# Patient Record
Sex: Female | Born: 1960 | Race: White | Hispanic: No | Marital: Single | Smoking: Current every day smoker
Health system: Southern US, Community
[De-identification: ages and names within clinical notes are randomized; demographics above are authoritative.]

## PROBLEM LIST (undated history)

## (undated) DIAGNOSIS — I639 Cerebral infarction, unspecified: Secondary | ICD-10-CM

## (undated) DIAGNOSIS — J449 Chronic obstructive pulmonary disease, unspecified: Secondary | ICD-10-CM

## (undated) DIAGNOSIS — I739 Peripheral vascular disease, unspecified: Secondary | ICD-10-CM

## (undated) DIAGNOSIS — E119 Type 2 diabetes mellitus without complications: Secondary | ICD-10-CM

## (undated) DIAGNOSIS — G8929 Other chronic pain: Secondary | ICD-10-CM

## (undated) DIAGNOSIS — I509 Heart failure, unspecified: Secondary | ICD-10-CM

## (undated) DIAGNOSIS — J45909 Unspecified asthma, uncomplicated: Secondary | ICD-10-CM

## (undated) HISTORY — PX: OTHER SURGICAL HISTORY: SHX169

---

## 2004-09-25 ENCOUNTER — Emergency Department (HOSPITAL_COMMUNITY): Admission: EM | Admit: 2004-09-25 | Discharge: 2004-09-25 | Payer: Self-pay | Admitting: Emergency Medicine

## 2013-01-14 ENCOUNTER — Inpatient Hospital Stay: Payer: Self-pay | Admitting: Internal Medicine

## 2013-01-14 LAB — CBC WITH DIFFERENTIAL/PLATELET
Basophil #: 0.1 x10 3/mm 3
Basophil %: 0.7 %
Eosinophil #: 0.3 x10 3/mm 3
Eosinophil %: 1.9 %
HCT: 33.8 % — ABNORMAL LOW
HGB: 11.1 g/dL — ABNORMAL LOW
Lymphocyte %: 20.4 %
Lymphs Abs: 2.8 x10 3/mm 3
MCH: 25.3 pg — ABNORMAL LOW
MCHC: 32.8 g/dL
MCV: 77 fL — ABNORMAL LOW
Monocyte #: 0.5 "x10 3/mm "
Monocyte %: 3.7 %
Neutrophil #: 10 x10 3/mm 3 — ABNORMAL HIGH
Neutrophil %: 73.3 %
Platelet: 474 x10 3/mm 3 — ABNORMAL HIGH
RBC: 4.38 X10 6/mm 3
RDW: 18.8 % — ABNORMAL HIGH
WBC: 13.6 x10 3/mm 3 — ABNORMAL HIGH

## 2013-01-14 LAB — COMPREHENSIVE METABOLIC PANEL WITH GFR
Albumin: 2.4 g/dL — ABNORMAL LOW
Alkaline Phosphatase: 162 U/L — ABNORMAL HIGH
Anion Gap: 5 — ABNORMAL LOW
BUN: 15 mg/dL
Bilirubin,Total: 0.2 mg/dL
Calcium, Total: 8.9 mg/dL
Chloride: 105 mmol/L
Co2: 27 mmol/L
Creatinine: 1.13 mg/dL
EGFR (African American): >60
EGFR (Non-African Amer.): 56 — ABNORMAL LOW
Glucose: 208 mg/dL — ABNORMAL HIGH
Osmolality: 281
Potassium: 3.9 mmol/L
SGOT(AST): 27 U/L
SGPT (ALT): 24 U/L
Sodium: 137 mmol/L
Total Protein: 8.3 g/dL — ABNORMAL HIGH

## 2013-01-14 LAB — CK-MB
CK-MB: 9.6 ng/mL — ABNORMAL HIGH
CK-MB: 12 ng/mL — ABNORMAL HIGH (ref 0.5–3.6)

## 2013-01-14 LAB — URINALYSIS, COMPLETE
Glucose,UR: NEGATIVE mg/dL (ref 0–75)
Leukocyte Esterase: NEGATIVE
Nitrite: NEGATIVE
Ph: 7 (ref 4.5–8.0)
Protein: 30
Specific Gravity: 1.008 (ref 1.003–1.030)
Squamous Epithelial: NONE SEEN
WBC UR: 1 /HPF (ref 0–5)

## 2013-01-14 LAB — APTT: Activated PTT: 54.4 s — ABNORMAL HIGH

## 2013-01-14 LAB — PROTIME-INR
INR: 2.6
Prothrombin Time: 27 s — ABNORMAL HIGH

## 2013-01-14 LAB — MAGNESIUM: Magnesium: 1.8 mg/dL

## 2013-01-14 LAB — TROPONIN I
Troponin-I: 0.46 ng/mL — ABNORMAL HIGH
Troponin-I: 1.3 ng/mL — ABNORMAL HIGH

## 2013-01-14 LAB — PRO B NATRIURETIC PEPTIDE: B-Type Natriuretic Peptide: 3056 pg/mL — ABNORMAL HIGH (ref 0–125)

## 2013-01-15 LAB — PROTIME-INR
INR: 2.9
Prothrombin Time: 29.2 secs — ABNORMAL HIGH (ref 11.5–14.7)

## 2013-01-15 LAB — HEMOGLOBIN A1C: Hemoglobin A1C: 9.5 % — ABNORMAL HIGH (ref 4.2–6.3)

## 2013-01-15 LAB — VANCOMYCIN, TROUGH: Vancomycin, Trough: 11 ug/mL (ref 10–20)

## 2013-01-15 LAB — CBC WITH DIFFERENTIAL/PLATELET
Basophil #: 0 10*3/uL (ref 0.0–0.1)
Eosinophil #: 0 10*3/uL (ref 0.0–0.7)
Eosinophil %: 0 %
HGB: 10.1 g/dL — ABNORMAL LOW (ref 12.0–16.0)
Lymphocyte #: 1.1 10*3/uL (ref 1.0–3.6)
Lymphocyte %: 9 %
MCV: 77 fL — ABNORMAL LOW (ref 80–100)
Monocyte #: 0.3 x10 3/mm (ref 0.2–0.9)
Neutrophil #: 10.6 10*3/uL — ABNORMAL HIGH (ref 1.4–6.5)
Platelet: 437 10*3/uL (ref 150–440)
RBC: 4.01 10*6/uL (ref 3.80–5.20)
RDW: 19 % — ABNORMAL HIGH (ref 11.5–14.5)

## 2013-01-15 LAB — BASIC METABOLIC PANEL
Anion Gap: 6 — ABNORMAL LOW (ref 7–16)
BUN: 24 mg/dL — ABNORMAL HIGH (ref 7–18)
Chloride: 101 mmol/L (ref 98–107)
Co2: 30 mmol/L (ref 21–32)
Creatinine: 1.15 mg/dL (ref 0.60–1.30)
EGFR (African American): >60
EGFR (Non-African Amer.): 55 — ABNORMAL LOW
Glucose: 392 mg/dL — ABNORMAL HIGH (ref 65–99)
Potassium: 4.2 mmol/L (ref 3.5–5.1)

## 2013-01-15 LAB — TSH: Thyroid Stimulating Horm: 0.705 u[IU]/mL

## 2013-01-16 LAB — PROTIME-INR
INR: 3.8
Prothrombin Time: 35.8 secs — ABNORMAL HIGH (ref 11.5–14.7)

## 2014-05-11 NOTE — H&P (Signed)
PATIENT NAME:  Alexandra Goodman, Alexandra Goodman MR#:  [id number] DATE OF BIRTH:  1960/05/27  DATE OF ADMISSION:  01/14/2013  PRIMARY CARE PHYSICIAN: Nonlocal, in [facility]  REFERRING PHYSICIAN: Aneta Mins A. Scotty Court, MD  CHIEF COMPLAINT: Shortness of breath, cough, wheezing for 2 days.  HISTORY OF PRESENT ILLNESS: A 54 year old Caucasian female with a history of CHF, COPD, diabetes, hypertension, neuropathy, left foot infection, who presented to the ED with shortness of breath, cough and wheezing for 2 days. The patient denies any fevers or chills. No dysuria or hematuria. The patient's symptoms have been worsening, even when the patient used nebulizer, without improvement. In addition, the patient has leg edema. Also, the patient has orthopnea and nocturnal dyspnea. The patient has left foot infection, has been on antibiotics including vancomycin and clindamycin for 2 weeks. The patient is status post left foot amputation in July. The patient developed a rash after antibiotic treatment. She is taking some Benadryl for rash. The patient was sent to ED due to worsening shortness of breath, O2 saturation decreased to 64%. Is being placed on BiPAP treatment.   PAST MEDICAL HISTORY:  1. Hypertension.  2. Diabetes. 3. CHF. 4. COPD. 5. Neuropathy. 6. Left foot infection status post amputation.   PAST SURGICAL HISTORY:  1. Left foot amputation.  2. Hysterectomy.   FAMILY HISTORY: Hypertension, diabetes, CHF.   SOCIAL HISTORY: No smoking, alcohol drinking or illicit drugs.   ALLERGIES: ACE INHIBITOR, FLAGYL AND SERTRALINE.   HOME MEDICATIONS:  1. Amitriptyline 150 mg p.o. daily.  2. Aspirin 81 mg p.o. daily.  3. Atenolol 25 mg p.o. daily.  4. Atorvastatin 80 mg p.o. daily. 5. Buspirone 10 mg p.o. daily.  6. Calcium alginate 4 x 4 bandage, cover with gauze foam.  7. Clindamycin 300 mg p.o. 3 times a day.  8. Cozaar 25 mg p.o. daily.  9. Diltiazem 180 mg p.o. daily.  10. Lovenox 80 mg injection  every 12 hours.  11. Ertapenem 1 gram IV PB daily.  12. Flonase 50 mcg nasal spray. 13. Potassium 10 mEq.  14. Pramipexole 0.125 mg tablet 1 tablet p.o. twice a day.  15. ProAir HFA 90 mcg.  16. Albuterol 2.5 mg/3 mL nebulizer solution 3 mL every 4 hours p.r.n. for wheezing.  17. Tiagabine 4 mg p.o. t.i.d.  18. Vancomycin 1250 mg IV PB every 12 hours.  19. Coumadin 5 mg p.o. 1.5 tablets daily.  20. Zantac 150 mg p.o.  21. Insulin NPH 70/30 at 58 units twice a day.  22. Actiform bandage apply topically daily.  23. Levothyroxine 50 mcg p.o. daily.  24. Metformin 1000 mg p.o. daily.  25. Nitroglycerin 0.4 mg p.o. tablets 1 tablet every 5 minutes for chest pain.  26. Oxybutynin 5 mg p.o. twice a day.  27. Oxycodone 5 mg p.o. every 4 hours p.r.n.  28. Oxycodone/acetaminophen 10/325 mg p.o. tablets every 6 hours p.r.n.  29. Plavix 75 mg p.o. daily.   REVIEW OF SYSTEMS:  CONSTITUTIONAL: The patient denies any fever or chills. No headache or dizziness, but has weakness.  EYES: No double vision or blurry vision.  EAR, NOSE, THROAT: No postnasal drip, slurred speech or dysphagia.  CARDIOVASCULAR: No chest pain, palpitations, but has orthopnea, nocturnal dyspnea and leg edema.  PULMONARY: Cough, sputum, shortness of breath, but no hemoptysis.  GASTROINTESTINAL: No abdominal pain, nausea, vomiting or diarrhea. No melena or bloody stool.  GENITOURINARY: No dysuria, hematuria or incontinence.  SKIN: Has a rash, but no jaundice.  ENDOCRINOLOGY: No  polyuria, polydipsia, heat or cold intolerance.   HEMATOLOGY: No easy bruising or bleeding.  ENDOCRINOLOGY: No polyuria, polydipsia, heat or cold intolerance.  NEUROLOGY: No syncope, loss of consciousness or seizure.   PHYSICAL EXAMINATION:  VITAL SIGNS: Temperature 97.2, blood pressure 151/82, pulse 104, O2 saturation 99% on BiPAP.  GENERAL: The patient is alert, awake, oriented, obese, in no acute distress on BiPAP.  HEENT: Pupils round and equally  reactive to light and accommodation. Dry oral mucosa. Clear oropharynx.  NECK: Supple. No JVD or carotid bruit. No lymphadenopathy. No thyromegaly.  CARDIOVASCULAR: S1, S2, regular rate and rhythm, tachycardia.  PULMONARY: Bilateral air entry, bilateral basilar rales. No use of accessory muscles to breathe.  ABDOMEN: Soft. No distention or tenderness. No organomegaly. Bowel sounds present.  EXTREMITIES: Bilateral leg edema at 1+. No clubbing or cyanosis. No calf tenderness. Left foot in dressing.  NEUROLOGY: A and O x3. No focal deficit. Power 5/5. Sensation intact.  SKIN: There is a rash on the extremities, especially bilateral lower extremities. No jaundice.  LABORATORY DATA: Showed:  Urinalysis is negative.  Chest x-ray: Findings are consistent with COPD or reactive airway disease, mildly increased interstitial markings.  ABG showed pH of 7.29, pCO2 of 57, pO2 of 67, with FiO2 35%.  Lactic acid 2.0.  Troponin 0.46.  BNP is 3056.  WBC 13.6, hemoglobin 11.1 and platelets of 474.  Glucose 208, BUN 15, creatinine 1.013. Electrolytes are normal.  CK 82, CK-MB 4.3.  EKG shows sinus tachycardia at 130 BPM.   IMPRESSIONS:  1. Acute respiratory failure, possibly due to a combination of chronic obstructive pulmonary disease exacerbation and acute on chronic congestive heart failure.  2. Elevated troponin, possibly due to demand ischemia.  3. Left foot infection with leukocytosis.  4. Rash, possibly due to antibiotic allergy. 5. Hypertension.  6. Diabetes.  7. Neuropathy.   PLAN OF TREATMENT:  1. The patient will be admitted to stepdown unit. Will continue BiPAP. Start Solu-Medrol, Xopenex and Levaquin.  2. For CHF, will start Lasix 40 mg IV b.i.d. Start CHF protocol. Get an echocardiograph, cardiology consult.  3. For elevated troponin, which is possibly due to demand ischemia, we will follow up troponin level. Continue aspirin, statin, Plavix, Coumadin. Check INR level and continue  Lovenox, get cardiology consult..  4. For left foot infection, will get wound care consult, [facility], but hold vancomycin, clindamycin and ertapenem at this time, but continue Levaquin.  5. Also, will give Benadryl p.r.n.  6. For diabetes, we will start sliding scale. Check hemoglobin A1c and continue the patient's home medication, insulin NPH 70/30.   I discussed the patient's condition and plan of treatment with the patient and the patient's health caregiver.   CODE STATUS: The patient wants full code  CRITICAL TIME: 75 minutes.   ____________________________ Shaune PollackQing Kable Haywood, MD qc:lb D: 01/14/2013 14:38:47 ET T: 01/14/2013 15:01:31 ET JOB#: [id number]  cc: Shaune PollackQing Flor Whitacre, MD, <Dictator> Shaune PollackQING Keatyn Jawad MD ELECTRONICALLY SIGNED 01/15/2013 12:38

## 2014-05-12 NOTE — Consult Note (Signed)
PATIENT NAME:  Alexandra Goodman, Alexandra Goodman MR#:  [id number] DATE OF BIRTH:  [birth year]  DATE OF CONSULTATION:  01/14/2013  CONSULTING PHYSICIAN:  Dwayne D. Juliann Pares, MD  REFERRING PHYSICIAN: Dr. Imogene Burn.   The patient normally goes to [facility].   INDICATION: Shortness of breath, possible heart failure.   HISTORY OF PRESENT ILLNESS: The patient is a 54 year old white female with history of CHF, COPD, diabetes, hypertension, neuropathy, left foot infection came to the Emergency Room with shortness of breath, cough, wheezing, for about 2 days. The patient denies any chills. No hematuria. Symptoms being worse over the last 2 to 3 days, even when the patient used her nebulizer at home for her COPD. She has had some leg edema, some orthopnea, nocturia, PND, left foot infection as well on the antibiotics without significant improvement. She had been taking Benadryl for a rash. There was some concern that the antibiotic caused a rash, came to the Emergency Room for evaluation, was placed on BiPAP. O2 sats in the 60s.   PAST MEDICAL HISTORY: Hypertension, diabetes, CHF, COPD, neuropathy, left foot infection.   PAST SURGICAL HISTORY: Left foot amputation, hysterectomy.   FAMILY HISTORY: Hypertension, diabetes, CHF.   SOCIAL HISTORY: No smoking or drinking or illicit drug use.   ALLERGIES: FLAGYL, SERTRALINE.   MEDICATIONS: Amitriptyline, aspirin 81 mg a day, atenolol 25 a day, atorvastatin 80 a day, buspirone 10 mg a day, calcium to  cover her bandage with a 4 x 4. Clindamycin 300 two times a day, Cozaar 25 a day, diltiazem 180 a day Lovenox 80 mg every 12 hours, ertapenem 1 gram IV a day, Flonase 50 mcg nasal spray once a day, potassium 10 mEq a day.  0.125, 1 tablet twice a day. ProAir, the patient's albuterol nebulizer p.r.n., Tiagabine 4 mg 3 times a day, vancomycin 1250 every 12 hours, Coumadin 5 mg 1-1/2 tablet daily, Zantac150  twice a day, insulin 70/30novolin units twice a day.  levothyroxine 50 mcg a day,  metformin 1000 mg daily, nitroglycerin p.r.n. Oxybutynin 5 mg twice a day, oxycodone 5 mg 4 times a day,  oxycodone/acetaminophen p.r.n., Plavix 75 a day.   REVIEW OF SYSTEMS: Denies blackout spells or syncope. No nausea or vomiting. No fever. No chills. No sweats. No weight loss or weight gain. No hemoptysis or hematemesis. No bright red blood per rectum. She has had shortness of breath, congestion, cough, wheezing and no rash.   PHYSICAL EXAMINATION: VITAL SIGNS: Blood pressure 150/80, pulse 100, respiratory rate 20 on BiPAP.  HEENT: Normocephalic, atraumatic. Pupils equal, reactive to light.  NECK: Supple. No significant JVD, bruits, or adenopathy.  LUNGS: Bilateral rhonchi, scattered wheezing no definite rales. Coarse breath sounds. Adequate air movement  ABDOMEN: Benign.  EXTREMITIES: Within normal limits.  NEUROLOGIC: Intact.  SKIN: Diffuse, erythematous rash.   LABORATORIES: UA was negative. Chest x-ray: COPD with increased interstitial markings. No definite pneumonia. ABG: 7.27, 57, 67,  sat of 35% FiO2. Lactic acid was 2. Troponin 0.46. BNP 3000. White count 13, hemoglobin 11, platelet count 474, glucose 208, BUN 15, creatinine 1.013, electrolytes normal. CK 80, MB 4.3. EKG: Sinus tach, initially at 130, now it is about 100 with nonspecific findings.   ASSESSMENT: Acute respiratory failure, chronic obstructive pulmonary disease, bronchitis, borderline troponins, probably demand ischemia, left foot infection, leukocytosis, rash, possibly antibiotic-related, hypertension, diabetes, neuropathy, chronic pain, hypoxemia, as well as diabetes.    PLAN:  1. Agree with admit. Rule out for myocardial infarction. Follow up cardiac enzymes. Mainly concentrate on  respiratory support, BiPAP, Solu-Medrol inhalers, IV antibiotics, pulmonary input.  2. Possible congestive heart failure. Continue IV Lasix. Echocardiogram will be helpful. Do not recommend any cardiac intervention at this point.  3.  Elevated troponins. Follow up troponins. Follow up EKGs. Suspect this is demand ischemia. Do not recommend a direct cardiac event at this point. Continue anticoagulation with Lovenox and heparin, and continue Plavix. 4.  . Continue antibiotics. Consider podiatry or vascular surgery consult. ID consult would be helpful.  5. Continue  for possible allergic reaction, and steroids will be helpful, both for her lungs and possibly her rash.  6. Continue diabetes management. At this point, we will treat the patient conservatively from a cardiac standpoint. No direct intervention necessary at this point.     Dwayne D. Juliann Paresallwood, MD ddc:sg D: 01/15/2013 06:46:09 ET T: 01/15/2013 11:13:19 ET JOB#: [id number]  cc: Dwayne D. Juliann Paresallwood, MD, <Dictator> Alwyn PeaWAYNE D CALLWOOD MD ELECTRONICALLY SIGNED 02/16/2013 16:25

## 2014-05-12 NOTE — Discharge Summary (Signed)
PATIENT NAME:  Alexandra Goodman, Alexandra Goodman MR#:  [id number] DATE OF BIRTH:  1960/11/10  DATE OF ADMISSION:  01/14/2013 DATE OF DISCHARGE:  01/16/2013  DISCHARGE DIAGNOSES: 1. Acute respiratory failure secondary to chronic obstructive pulmonary disease exacerbation.  2. Acute-on-chronic diastolic heart failure.  3. Elevated troponins secondary to demand ischemia from chronic obstructive pulmonary disease and congestive heart failure.  4. Diabetes mellitus type 2.  5. 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  6. History of deep vein thrombosis with hypercoagulable state.  7. History of coronary artery disease with stents.   DISCHARGE MEDICATIONS: 1. Amitriptyline 150 mg 1 tablet p.o. at bedtime as needed.  2. Aspirin 81 mg p.o. daily.  3. Atenolol 25 mg p.o. daily.  4. Atorvastatin 80 mg p.o. daily.  5. BuSpar 10 mg p.o. b.i.d.  6. Colace 100 mg p.o. as needed for constipation.  7. Losartan 25 mg p.o. daily.  8. Cardizem CD 180 mg p.o. daily.  9. Fluticasone nasal spray 50 mcg 2 sprays in each nostril daily.  10. Lasix 40 mg 2 tablets, that  is 80 mg in the morning and 40 mg at bedtime.  11. NPH insulin 70/30 58 units 2 times a day. The patient knows how to adjust insulin  depending on sugar levels.  12. Levothyroxine 50 mcg p.o. daily.  13. Metformin 1 g p.o. b.i.d.  14. Nitroglycerin sublingual as needed for chest pain.  15. Oxybutynin 5 mg p.o. b.i.d. 16. Oxycodone 5 mg every 4 hours as needed for pain.  17. Percocet 10/325, 1 tablet every 6 hours as needed for pain.  18. Plavix 75 mg p.o. daily.  19. KCl 10 mEq p.o. daily.  20. Pramipexole 0.125 mg p.o. b.i.d.  21. ProAir 2 puffs 4 times a day as needed for wheezing.  22. Nebulizer, albuterol with Atrovent every 4 hours as needed for wheezing.  23. Tiagabine 4 mg 1 tablet p.o. t.i.d.  24. Vancomycin 1 g every 18 hours. The patient needs it until she sees the podiatrist on Wednesday.  25. Levaquin  750 mg every 24 hours for 10 days.  26. Prednisone dose tab 320 mg 3 tablets daily for 2 days, 2 tablet s daily for 2 days, 1 tablet daily for 2 days, and then stop.   DIET: Low-sodium, low-fat, ADA diet.   [facility], so we advised her to resume the home health regarding antibiotics and dressing changes for the left foot wound.   [facility]. The patient is following up with them regarding MRSA infection in the left foot and also left foot osteomyelitis. She is on vancomycin. She was also on ertapenem, but that was discontinued because of a rash.  She [facility] possible [facility] of the left foot.   HOSPITAL COURSE: 1. Respiratory failure. A 54 year old female patient brought in because of shortness of breath, cough, and wheezing. The patient's O2 saturations were 64% on room air. On arrival she was placed on BiPAP, admitted to ICU. The patient's temperature was 97.2. X-ray of the chest showed COPD with reactive airway disease and mild interstitial markings. ABG was 7.29 pH and CO2 was 57, pO2 of 67. The patient's troponins were elevated. She had a BNP of 3056. She had started on Levaquin, IV steroids, and also nebulizers. She was able to come off the BiPAP because of steroids, antibiotics, and nebulizers.  The patient's symptoms improved nicely.  She also received IV Lasix because of her CHF exacerbation.  The patient assessed for home oxygen need, but she maintained sats of above 90 on room air at rest and also exertion, so she did not qualify for home oxygen. Advised her to continue tapering course of prednisone, Levaquin, and nebulizers, and follow up with her doctor at [facility].  2. Acute-on-chronic diastolic heart failure. The patient is seen by Dr. Juliann Pares because her troponins were elevated during the hospital stay. She never had chest pain. The patient's initial troponin 0.46, the following one was  1.30, and then the third one 2.10.  The patient's EKG showed sinus tach with 136 beats per minute on admission. The patient's troponin elevation thought to be secondary to demand ischemia because of her CHF and COPD. The echocardiogram showed EF of 50% to 55% with mild anteroapical hypo.  The patient has mild systolic dysfunction.  Anyway, the patient felt better with Lasix, so we continued Lasix, and she was discharged with home dose Lasix. 3. History of hypercoagulable state. She has DVT history and also some coagulation problems. She is on Coumadin for a long time. We continued that, and the patient's INR was 3.8 on the 29th.  4. Diabetes mellitus type 2.  Sugars are elevated because of steroids. The patient told me that she knows how to adjust insulin depending on sugars, and she has a machine at home that she checks.  5. History of coronary artery disease and stent placement. She is on aspirin and Plavix and Coumadin. Continue that.   DISCHARGE VITAL SIGNS: Temperature 97.3, heart rate 80, blood pressure 130/42, sats of 92% on room air at rest and also 90% on exertion.   CONDITION AT TIME OF DISCHARGE: Stable.   TIME SPENT ON DISCHARGE PREPARATION: More than 30 minutes.   ADDENDUM:  The patient has history of DVT and history of left foot infection. She is following up with [facility] podiatry. She is on vancomycin and also getting home care, home health through [facility]. She is on vancomycin. Here we started vancomycin, the home dose, but pharmacy had to adjust the dose to 1 g every 18 hours. The patient discharged home with vancomycin and also Levaquin. Vancomycin  levels here, the vancomycin trough was 11 on the 28th.  The patient advised to follow up with [facility] on Wednesday, that is tomorrow, regarding possible foot amputation.   TIME SPENT ON DISCHARGE PREPARATION: More than 30 minutes.   ____________________________ Katha Hamming, MD sk:sg D: 01/17/2013 08:24:00 ET T: 01/17/2013 08:47:50  ET JOB#: [id number]  cc: Katha Hamming, MD, <Dictator> Katha Hamming MD ELECTRONICALLY SIGNED 01/22/2013 17:24

## 2016-05-05 ENCOUNTER — Other Ambulatory Visit
Admission: RE | Admit: 2016-05-05 | Discharge: 2016-05-05 | Disposition: A | Discharge status: Home / Self Care | Payer: Medicare Other | Source: Ambulatory Visit | Attending: Internal Medicine | Admitting: Internal Medicine

## 2016-05-05 DIAGNOSIS — I739 Peripheral vascular disease, unspecified: Secondary | ICD-10-CM | POA: Insufficient documentation

## 2016-05-05 LAB — CBC WITH DIFFERENTIAL/PLATELET
Basophils Absolute: 0 10*3/uL (ref 0–0.1)
Basophils Relative: 1 %
Eosinophils Absolute: 0.5 10*3/uL (ref 0–0.7)
Eosinophils Relative: 5 %
HCT: 37.8 % (ref 35.0–47.0)
HEMOGLOBIN: 12.5 g/dL (ref 12.0–16.0)
LYMPHS ABS: 2 10*3/uL (ref 1.0–3.6)
LYMPHS PCT: 23 %
MCH: 25.6 pg — AB (ref 26.0–34.0)
MCHC: 33 g/dL (ref 32.0–36.0)
MCV: 77.7 fL — AB (ref 80.0–100.0)
Monocytes Absolute: 0.6 10*3/uL (ref 0.2–0.9)
Monocytes Relative: 7 %
Neutro Abs: 5.6 10*3/uL (ref 1.4–6.5)
Neutrophils Relative %: 64 %
Platelets: 225 10*3/uL (ref 150–440)
RBC: 4.86 MIL/uL (ref 3.80–5.20)
RDW: 20.5 % — ABNORMAL HIGH (ref 11.5–14.5)
WBC: 8.7 10*3/uL (ref 3.6–11.0)

## 2016-05-05 LAB — CREATININE, SERUM
Creatinine, Ser: 1 mg/dL (ref 0.44–1.00)
GFR calc non Af Amer: >60 mL/min (ref >60)

## 2016-05-05 LAB — BUN: BUN: 18 mg/dL (ref 6–20)

## 2018-04-20 ENCOUNTER — Other Ambulatory Visit: Payer: Self-pay

## 2018-04-20 ENCOUNTER — Emergency Department: Payer: Medicare Other

## 2018-04-20 ENCOUNTER — Inpatient Hospital Stay
Admission: EM | Admit: 2018-04-20 | Discharge: 2018-04-27 | DRG: 871 | Disposition: A | Discharge status: Home Health | Payer: Medicare Other | Attending: Internal Medicine | Admitting: Internal Medicine

## 2018-04-20 DIAGNOSIS — I4891 Unspecified atrial fibrillation: Secondary | ICD-10-CM | POA: Diagnosis not present

## 2018-04-20 DIAGNOSIS — Z7902 Long term (current) use of antithrombotics/antiplatelets: Secondary | ICD-10-CM

## 2018-04-20 DIAGNOSIS — A419 Sepsis, unspecified organism: Secondary | ICD-10-CM | POA: Diagnosis present

## 2018-04-20 DIAGNOSIS — B9561 Methicillin susceptible Staphylococcus aureus infection as the cause of diseases classified elsewhere: Secondary | ICD-10-CM | POA: Diagnosis not present

## 2018-04-20 DIAGNOSIS — Z8673 Personal history of transient ischemic attack (TIA), and cerebral infarction without residual deficits: Secondary | ICD-10-CM

## 2018-04-20 DIAGNOSIS — F17210 Nicotine dependence, cigarettes, uncomplicated: Secondary | ICD-10-CM | POA: Diagnosis present

## 2018-04-20 DIAGNOSIS — Z6825 Body mass index (BMI) 25.0-25.9, adult: Secondary | ICD-10-CM

## 2018-04-20 DIAGNOSIS — J9621 Acute and chronic respiratory failure with hypoxia: Secondary | ICD-10-CM | POA: Diagnosis present

## 2018-04-20 DIAGNOSIS — N390 Urinary tract infection, site not specified: Secondary | ICD-10-CM | POA: Diagnosis present

## 2018-04-20 DIAGNOSIS — R7881 Bacteremia: Secondary | ICD-10-CM | POA: Diagnosis not present

## 2018-04-20 DIAGNOSIS — E874 Mixed disorder of acid-base balance: Secondary | ICD-10-CM | POA: Diagnosis present

## 2018-04-20 DIAGNOSIS — I248 Other forms of acute ischemic heart disease: Secondary | ICD-10-CM | POA: Diagnosis present

## 2018-04-20 DIAGNOSIS — F1110 Opioid abuse, uncomplicated: Secondary | ICD-10-CM | POA: Diagnosis present

## 2018-04-20 DIAGNOSIS — Z89512 Acquired absence of left leg below knee: Secondary | ICD-10-CM

## 2018-04-20 DIAGNOSIS — S32000A Wedge compression fracture of unspecified lumbar vertebra, initial encounter for closed fracture: Secondary | ICD-10-CM

## 2018-04-20 DIAGNOSIS — L97519 Non-pressure chronic ulcer of other part of right foot with unspecified severity: Secondary | ICD-10-CM | POA: Diagnosis present

## 2018-04-20 DIAGNOSIS — E039 Hypothyroidism, unspecified: Secondary | ICD-10-CM | POA: Diagnosis present

## 2018-04-20 DIAGNOSIS — J189 Pneumonia, unspecified organism: Secondary | ICD-10-CM | POA: Diagnosis present

## 2018-04-20 DIAGNOSIS — J9600 Acute respiratory failure, unspecified whether with hypoxia or hypercapnia: Secondary | ICD-10-CM

## 2018-04-20 DIAGNOSIS — J441 Chronic obstructive pulmonary disease with (acute) exacerbation: Secondary | ICD-10-CM | POA: Diagnosis present

## 2018-04-20 DIAGNOSIS — L8990 Pressure ulcer of unspecified site, unspecified stage: Secondary | ICD-10-CM

## 2018-04-20 DIAGNOSIS — Z20828 Contact with and (suspected) exposure to other viral communicable diseases: Secondary | ICD-10-CM | POA: Diagnosis present

## 2018-04-20 DIAGNOSIS — M4626 Osteomyelitis of vertebra, lumbar region: Secondary | ICD-10-CM | POA: Diagnosis present

## 2018-04-20 DIAGNOSIS — A4101 Sepsis due to Methicillin susceptible Staphylococcus aureus: Principal | ICD-10-CM | POA: Diagnosis present

## 2018-04-20 DIAGNOSIS — E1122 Type 2 diabetes mellitus with diabetic chronic kidney disease: Secondary | ICD-10-CM | POA: Diagnosis present

## 2018-04-20 DIAGNOSIS — M4646 Discitis, unspecified, lumbar region: Secondary | ICD-10-CM | POA: Diagnosis present

## 2018-04-20 DIAGNOSIS — G8929 Other chronic pain: Secondary | ICD-10-CM | POA: Diagnosis present

## 2018-04-20 DIAGNOSIS — E1142 Type 2 diabetes mellitus with diabetic polyneuropathy: Secondary | ICD-10-CM | POA: Diagnosis not present

## 2018-04-20 DIAGNOSIS — E7800 Pure hypercholesterolemia, unspecified: Secondary | ICD-10-CM | POA: Diagnosis present

## 2018-04-20 DIAGNOSIS — Z881 Allergy status to other antibiotic agents status: Secondary | ICD-10-CM | POA: Diagnosis not present

## 2018-04-20 DIAGNOSIS — R410 Disorientation, unspecified: Secondary | ICD-10-CM | POA: Diagnosis not present

## 2018-04-20 DIAGNOSIS — I2510 Atherosclerotic heart disease of native coronary artery without angina pectoris: Secondary | ICD-10-CM | POA: Diagnosis present

## 2018-04-20 DIAGNOSIS — E872 Acidosis, unspecified: Secondary | ICD-10-CM

## 2018-04-20 DIAGNOSIS — I130 Hypertensive heart and chronic kidney disease with heart failure and stage 1 through stage 4 chronic kidney disease, or unspecified chronic kidney disease: Secondary | ICD-10-CM | POA: Diagnosis present

## 2018-04-20 DIAGNOSIS — R443 Hallucinations, unspecified: Secondary | ICD-10-CM | POA: Diagnosis not present

## 2018-04-20 DIAGNOSIS — E1110 Type 2 diabetes mellitus with ketoacidosis without coma: Secondary | ICD-10-CM | POA: Diagnosis present

## 2018-04-20 DIAGNOSIS — E1151 Type 2 diabetes mellitus with diabetic peripheral angiopathy without gangrene: Secondary | ICD-10-CM | POA: Diagnosis present

## 2018-04-20 DIAGNOSIS — I509 Heart failure, unspecified: Secondary | ICD-10-CM | POA: Diagnosis present

## 2018-04-20 DIAGNOSIS — E440 Moderate protein-calorie malnutrition: Secondary | ICD-10-CM | POA: Diagnosis present

## 2018-04-20 DIAGNOSIS — E11621 Type 2 diabetes mellitus with foot ulcer: Secondary | ICD-10-CM | POA: Diagnosis present

## 2018-04-20 DIAGNOSIS — Z79890 Hormone replacement therapy (postmenopausal): Secondary | ICD-10-CM

## 2018-04-20 DIAGNOSIS — Z8249 Family history of ischemic heart disease and other diseases of the circulatory system: Secondary | ICD-10-CM

## 2018-04-20 DIAGNOSIS — Z87442 Personal history of urinary calculi: Secondary | ICD-10-CM

## 2018-04-20 DIAGNOSIS — Z91048 Other nonmedicinal substance allergy status: Secondary | ICD-10-CM

## 2018-04-20 DIAGNOSIS — E876 Hypokalemia: Secondary | ICD-10-CM | POA: Diagnosis present

## 2018-04-20 DIAGNOSIS — N182 Chronic kidney disease, stage 2 (mild): Secondary | ICD-10-CM | POA: Diagnosis present

## 2018-04-20 DIAGNOSIS — E871 Hypo-osmolality and hyponatremia: Secondary | ICD-10-CM | POA: Diagnosis present

## 2018-04-20 DIAGNOSIS — Z720 Tobacco use: Secondary | ICD-10-CM | POA: Diagnosis not present

## 2018-04-20 DIAGNOSIS — R739 Hyperglycemia, unspecified: Secondary | ICD-10-CM

## 2018-04-20 DIAGNOSIS — E785 Hyperlipidemia, unspecified: Secondary | ICD-10-CM | POA: Diagnosis present

## 2018-04-20 DIAGNOSIS — M4856XA Collapsed vertebra, not elsewhere classified, lumbar region, initial encounter for fracture: Secondary | ICD-10-CM | POA: Diagnosis present

## 2018-04-20 DIAGNOSIS — Z8631 Personal history of diabetic foot ulcer: Secondary | ICD-10-CM | POA: Diagnosis not present

## 2018-04-20 DIAGNOSIS — R911 Solitary pulmonary nodule: Secondary | ICD-10-CM | POA: Diagnosis present

## 2018-04-20 DIAGNOSIS — M86171 Other acute osteomyelitis, right ankle and foot: Secondary | ICD-10-CM

## 2018-04-20 DIAGNOSIS — N179 Acute kidney failure, unspecified: Secondary | ICD-10-CM | POA: Diagnosis present

## 2018-04-20 DIAGNOSIS — J181 Lobar pneumonia, unspecified organism: Secondary | ICD-10-CM

## 2018-04-20 DIAGNOSIS — G9341 Metabolic encephalopathy: Secondary | ICD-10-CM | POA: Diagnosis present

## 2018-04-20 DIAGNOSIS — J440 Chronic obstructive pulmonary disease with acute lower respiratory infection: Secondary | ICD-10-CM | POA: Diagnosis present

## 2018-04-20 DIAGNOSIS — Z7901 Long term (current) use of anticoagulants: Secondary | ICD-10-CM

## 2018-04-20 DIAGNOSIS — R0902 Hypoxemia: Secondary | ICD-10-CM | POA: Diagnosis not present

## 2018-04-20 DIAGNOSIS — Z888 Allergy status to other drugs, medicaments and biological substances status: Secondary | ICD-10-CM

## 2018-04-20 DIAGNOSIS — R590 Localized enlarged lymph nodes: Secondary | ICD-10-CM | POA: Diagnosis present

## 2018-04-20 DIAGNOSIS — I480 Paroxysmal atrial fibrillation: Secondary | ICD-10-CM | POA: Diagnosis present

## 2018-04-20 DIAGNOSIS — Z86718 Personal history of other venous thrombosis and embolism: Secondary | ICD-10-CM

## 2018-04-20 DIAGNOSIS — Z794 Long term (current) use of insulin: Secondary | ICD-10-CM

## 2018-04-20 HISTORY — DX: Other chronic pain: G89.29

## 2018-04-20 HISTORY — DX: Heart failure, unspecified: I50.9

## 2018-04-20 HISTORY — DX: Type 2 diabetes mellitus without complications: E11.9

## 2018-04-20 HISTORY — DX: Peripheral vascular disease, unspecified: I73.9

## 2018-04-20 HISTORY — DX: Cerebral infarction, unspecified: I63.9

## 2018-04-20 HISTORY — DX: Chronic obstructive pulmonary disease, unspecified: J44.9

## 2018-04-20 HISTORY — DX: Unspecified asthma, uncomplicated: J45.909

## 2018-04-20 LAB — BASIC METABOLIC PANEL
Anion gap: 16 — ABNORMAL HIGH (ref 5–15)
Anion gap: 11 (ref 5–15)
BUN: 58 mg/dL — ABNORMAL HIGH (ref 6–20)
BUN: 53 mg/dL — ABNORMAL HIGH (ref 6–20)
CO2: 22 mmol/L (ref 22–32)
CO2: 27 mmol/L (ref 22–32)
Calcium: 8.8 mg/dL — ABNORMAL LOW (ref 8.9–10.3)
Calcium: 9.4 mg/dL (ref 8.9–10.3)
Chloride: 87 mmol/L — ABNORMAL LOW (ref 98–111)
Chloride: 92 mmol/L — ABNORMAL LOW (ref 98–111)
Creatinine, Ser: 1.1 mg/dL — ABNORMAL HIGH (ref 0.44–1.00)
Creatinine, Ser: 1.09 mg/dL — ABNORMAL HIGH (ref 0.44–1.00)
GFR calc Af Amer: >60 mL/min (ref >60)
GFR calc Af Amer: >60 mL/min (ref >60)
GFR calc non Af Amer: 56 mL/min — ABNORMAL LOW (ref >60)
GFR calc non Af Amer: 56 mL/min — ABNORMAL LOW (ref >60)
Glucose, Bld: 550 mg/dL (ref 70–99)
Glucose, Bld: 344 mg/dL — ABNORMAL HIGH (ref 70–99)
Potassium: 4.5 mmol/L (ref 3.5–5.1)
Potassium: 5.2 mmol/L — ABNORMAL HIGH (ref 3.5–5.1)
Sodium: 125 mmol/L — ABNORMAL LOW (ref 135–145)
Sodium: 130 mmol/L — ABNORMAL LOW (ref 135–145)

## 2018-04-20 LAB — CBC WITH DIFFERENTIAL/PLATELET
Abs Immature Granulocytes: 0.15 10*3/uL — ABNORMAL HIGH (ref 0.00–0.07)
Basophils Absolute: 0.1 10*3/uL (ref 0.0–0.1)
Basophils Relative: 0 %
Eosinophils Absolute: 0.2 10*3/uL (ref 0.0–0.5)
Eosinophils Relative: 1 %
HCT: 39.8 % (ref 36.0–46.0)
Hemoglobin: 13 g/dL (ref 12.0–15.0)
Immature Granulocytes: 1 %
Lymphocytes Relative: 3 %
Lymphs Abs: 0.7 10*3/uL (ref 0.7–4.0)
MCH: 25.1 pg — ABNORMAL LOW (ref 26.0–34.0)
MCHC: 32.7 g/dL (ref 30.0–36.0)
MCV: 76.8 fL — ABNORMAL LOW (ref 80.0–100.0)
Monocytes Absolute: 0.8 10*3/uL (ref 0.1–1.0)
Monocytes Relative: 4 %
Neutro Abs: 20.1 10*3/uL — ABNORMAL HIGH (ref 1.7–7.7)
Neutrophils Relative %: 91 %
Platelets: 320 10*3/uL (ref 150–400)
RBC: 5.18 MIL/uL — ABNORMAL HIGH (ref 3.87–5.11)
RDW: 18.4 % — ABNORMAL HIGH (ref 11.5–15.5)
WBC: 22 10*3/uL — ABNORMAL HIGH (ref 4.0–10.5)
nRBC: 0 % (ref 0.0–0.2)

## 2018-04-20 LAB — URINALYSIS, COMPLETE (UACMP) WITH MICROSCOPIC
Bacteria, UA: NONE SEEN
Bilirubin Urine: NEGATIVE
Glucose, UA: >=500 mg/dL — AB
Ketones, ur: NEGATIVE mg/dL
Nitrite: POSITIVE — AB
Protein, ur: NEGATIVE mg/dL
Specific Gravity, Urine: 1.016 (ref 1.005–1.030)
pH: 6 (ref 5.0–8.0)

## 2018-04-20 LAB — PROTIME-INR
INR: >10 (ref 0.8–1.2)
Prothrombin Time: >90 seconds — ABNORMAL HIGH (ref 11.4–15.2)

## 2018-04-20 LAB — BLOOD GAS, VENOUS
Acid-Base Excess: 2.4 mmol/L — ABNORMAL HIGH (ref 0.0–2.0)
Bicarbonate: 28.8 mmol/L — ABNORMAL HIGH (ref 20.0–28.0)
O2 Saturation: 76.4 %
Patient temperature: 37
pCO2, Ven: 51 mmHg (ref 44.0–60.0)
pH, Ven: 7.36 (ref 7.250–7.430)
pO2, Ven: 43 mmHg (ref 32.0–45.0)

## 2018-04-20 LAB — GLUCOSE, CAPILLARY
Glucose-Capillary: 342 mg/dL — ABNORMAL HIGH (ref 70–99)
Glucose-Capillary: 380 mg/dL — ABNORMAL HIGH (ref 70–99)
Glucose-Capillary: 459 mg/dL — ABNORMAL HIGH (ref 70–99)
Glucose-Capillary: 511 mg/dL (ref 70–99)
Glucose-Capillary: 565 mg/dL (ref 70–99)
Glucose-Capillary: >600 mg/dL (ref 70–99)
Glucose-Capillary: >600 mg/dL (ref 70–99)

## 2018-04-20 LAB — LACTIC ACID, PLASMA
Lactic Acid, Venous: 2 mmol/L (ref 0.5–1.9)
Lactic Acid, Venous: 2.9 mmol/L (ref 0.5–1.9)

## 2018-04-20 LAB — COMPREHENSIVE METABOLIC PANEL
ALT: 22 U/L (ref 0–44)
AST: 21 U/L (ref 15–41)
Albumin: 2.1 g/dL — ABNORMAL LOW (ref 3.5–5.0)
Alkaline Phosphatase: 152 U/L — ABNORMAL HIGH (ref 38–126)
Anion gap: 11 (ref 5–15)
BUN: 62 mg/dL — ABNORMAL HIGH (ref 6–20)
CO2: 25 mmol/L (ref 22–32)
Calcium: 8.9 mg/dL (ref 8.9–10.3)
Chloride: 88 mmol/L — ABNORMAL LOW (ref 98–111)
Creatinine, Ser: 1.16 mg/dL — ABNORMAL HIGH (ref 0.44–1.00)
GFR calc Af Amer: >60 mL/min (ref >60)
GFR calc non Af Amer: 52 mL/min — ABNORMAL LOW (ref >60)
Glucose, Bld: 650 mg/dL (ref 70–99)
Potassium: 5 mmol/L (ref 3.5–5.1)
Sodium: 124 mmol/L — ABNORMAL LOW (ref 135–145)
Total Bilirubin: 0.7 mg/dL (ref 0.3–1.2)
Total Protein: 6.1 g/dL — ABNORMAL LOW (ref 6.5–8.1)

## 2018-04-20 LAB — MRSA PCR SCREENING: MRSA by PCR: NEGATIVE

## 2018-04-20 LAB — BLOOD GAS, ARTERIAL
Acid-Base Excess: 1.5 mmol/L (ref 0.0–2.0)
Bicarbonate: 26.6 mmol/L (ref 20.0–28.0)
O2 Saturation: 94.9 %
Patient temperature: 37
pCO2 arterial: 43 mmHg (ref 32.0–48.0)
pH, Arterial: 7.4 (ref 7.350–7.450)
pO2, Arterial: 75 mmHg — ABNORMAL LOW (ref 83.0–108.0)

## 2018-04-20 LAB — BETA-HYDROXYBUTYRIC ACID: Beta-Hydroxybutyric Acid: 0.47 mmol/L — ABNORMAL HIGH (ref 0.05–0.27)

## 2018-04-20 LAB — TROPONIN I
Troponin I: 0.99 ng/mL (ref <0.03)
Troponin I: 0.57 ng/mL (ref <0.03)

## 2018-04-20 LAB — HEMOGLOBIN A1C
Hgb A1c MFr Bld: 9.6 % — ABNORMAL HIGH (ref 4.8–5.6)
Mean Plasma Glucose: 228.82 mg/dL

## 2018-04-20 LAB — BRAIN NATRIURETIC PEPTIDE: B Natriuretic Peptide: 610 pg/mL — ABNORMAL HIGH (ref 0.0–100.0)

## 2018-04-20 MED ORDER — ONDANSETRON HCL 4 MG PO TABS: 4.0000 mg | ORAL_TABLET | Freq: Four times a day (QID) | ORAL | Status: DC | PRN

## 2018-04-20 MED ORDER — WARFARIN - PHARMACIST DOSING INPATIENT: Freq: Every day | Status: DC

## 2018-04-20 MED ORDER — SODIUM CHLORIDE 0.9 % IV SOLN: INTRAVENOUS | Status: DC

## 2018-04-20 MED ORDER — AMLODIPINE BESYLATE 5 MG PO TABS
5.0000 mg | ORAL_TABLET | Freq: Every day | ORAL | Status: DC
  Administered 2018-04-21: 5 mg via ORAL
  Filled 2018-04-20: qty 1

## 2018-04-20 MED ORDER — MORPHINE SULFATE ER 15 MG PO TBCR
30.0000 mg | EXTENDED_RELEASE_TABLET | Freq: Two times a day (BID) | ORAL | Status: DC
  Administered 2018-04-21 – 2018-04-23 (×5): 30 mg via ORAL
  Administered 2018-04-23: 15 mg via ORAL
  Administered 2018-04-24 – 2018-04-27 (×7): 30 mg via ORAL
  Filled 2018-04-20 (×14): qty 2

## 2018-04-20 MED ORDER — FAMOTIDINE 20 MG PO TABS
40.0000 mg | ORAL_TABLET | Freq: Every evening | ORAL | Status: DC
  Administered 2018-04-20 – 2018-04-26 (×7): 40 mg via ORAL
  Filled 2018-04-20 (×7): qty 2

## 2018-04-20 MED ORDER — DILTIAZEM HCL 100 MG IV SOLR
INTRAVENOUS | Status: AC
  Administered 2018-04-20: 18:00:00 5 mg/h via INTRAVENOUS
  Filled 2018-04-20: qty 100

## 2018-04-20 MED ORDER — INSULIN ASPART 100 UNIT/ML ~~LOC~~ SOLN: 0.0000 [IU] | Freq: Every day | SUBCUTANEOUS | Status: DC

## 2018-04-20 MED ORDER — DIGOXIN 0.25 MG/ML IJ SOLN
0.5000 mg | Freq: Once | INTRAMUSCULAR | Status: DC
  Filled 2018-04-20: qty 2

## 2018-04-20 MED ORDER — MONTELUKAST SODIUM 10 MG PO TABS
10.0000 mg | ORAL_TABLET | Freq: Every day | ORAL | Status: DC
  Administered 2018-04-20 – 2018-04-27 (×8): 10 mg via ORAL
  Filled 2018-04-20 (×8): qty 1

## 2018-04-20 MED ORDER — SODIUM CHLORIDE 0.9 % IV BOLUS
500.0000 mL | Freq: Once | INTRAVENOUS | Status: AC
  Administered 2018-04-20: 500 mL via INTRAVENOUS

## 2018-04-20 MED ORDER — ATORVASTATIN CALCIUM 20 MG PO TABS
80.0000 mg | ORAL_TABLET | Freq: Every day | ORAL | Status: DC
  Administered 2018-04-20 – 2018-04-26 (×7): 80 mg via ORAL
  Filled 2018-04-20 (×7): qty 4

## 2018-04-20 MED ORDER — INSULIN REGULAR(HUMAN) IN NACL 100-0.9 UT/100ML-% IV SOLN
INTRAVENOUS | Status: DC
  Administered 2018-04-20: 21:00:00 4.5 [IU]/h via INTRAVENOUS
  Filled 2018-04-20 (×2): qty 100

## 2018-04-20 MED ORDER — DILTIAZEM HCL 25 MG/5ML IV SOLN
INTRAVENOUS | Status: AC
  Administered 2018-04-20: 18:00:00 10 mg
  Filled 2018-04-20: qty 5

## 2018-04-20 MED ORDER — LACTATED RINGERS IV BOLUS
500.0000 mL | Freq: Once | INTRAVENOUS | Status: AC
  Administered 2018-04-20: 500 mL via INTRAVENOUS

## 2018-04-20 MED ORDER — DIGOXIN 0.25 MG/ML IJ SOLN
0.1250 mg | Freq: Four times a day (QID) | INTRAMUSCULAR | Status: DC
  Filled 2018-04-20 (×4): qty 2

## 2018-04-20 MED ORDER — METOPROLOL SUCCINATE ER 50 MG PO TB24
100.0000 mg | ORAL_TABLET | Freq: Every day | ORAL | Status: DC
  Administered 2018-04-21: 100 mg via ORAL
  Filled 2018-04-20: qty 2

## 2018-04-20 MED ORDER — OXYCODONE-ACETAMINOPHEN 10-325 MG PO TABS: 1.0000 | ORAL_TABLET | Freq: Every day | ORAL | Status: DC

## 2018-04-20 MED ORDER — DEXTROSE-NACL 5-0.45 % IV SOLN: INTRAVENOUS | Status: DC

## 2018-04-20 MED ORDER — DILTIAZEM HCL 100 MG IV SOLR
5.0000 mg/h | INTRAVENOUS | Status: DC
  Administered 2018-04-20: 10 mg/h via INTRAVENOUS
  Administered 2018-04-20 (×2): 5 mg/h via INTRAVENOUS
  Filled 2018-04-20 (×2): qty 100

## 2018-04-20 MED ORDER — ACETAMINOPHEN 325 MG PO TABS
650.0000 mg | ORAL_TABLET | Freq: Four times a day (QID) | ORAL | Status: DC | PRN
  Administered 2018-04-27: 650 mg via ORAL
  Filled 2018-04-20: qty 2

## 2018-04-20 MED ORDER — OXYCODONE HCL 5 MG PO TABS
5.0000 mg | ORAL_TABLET | Freq: Every day | ORAL | Status: DC
  Administered 2018-04-20 – 2018-04-23 (×11): 5 mg via ORAL
  Filled 2018-04-20 (×13): qty 1

## 2018-04-20 MED ORDER — VANCOMYCIN HCL IN DEXTROSE 1-5 GM/200ML-% IV SOLN
1000.0000 mg | Freq: Once | INTRAVENOUS | Status: AC
  Administered 2018-04-20: 1000 mg via INTRAVENOUS
  Filled 2018-04-20: qty 200

## 2018-04-20 MED ORDER — INSULIN ASPART 100 UNIT/ML ~~LOC~~ SOLN
0.0000 [IU] | Freq: Three times a day (TID) | SUBCUTANEOUS | Status: DC
  Administered 2018-04-20: 17:00:00 20 [IU] via SUBCUTANEOUS
  Filled 2018-04-20: qty 1

## 2018-04-20 MED ORDER — MOMETASONE FURO-FORMOTEROL FUM 200-5 MCG/ACT IN AERO
2.0000 | INHALATION_SPRAY | Freq: Two times a day (BID) | RESPIRATORY_TRACT | Status: DC
  Administered 2018-04-20 – 2018-04-27 (×13): 2 via RESPIRATORY_TRACT
  Filled 2018-04-20 (×2): qty 8.8

## 2018-04-20 MED ORDER — DEXTROSE IN LACTATED RINGERS 5 % IV SOLN
INTRAVENOUS | Status: DC
  Administered 2018-04-21: 04:00:00 via INTRAVENOUS

## 2018-04-20 MED ORDER — FLUOXETINE HCL 20 MG PO CAPS
20.0000 mg | ORAL_CAPSULE | Freq: Every day | ORAL | Status: DC
  Administered 2018-04-21 – 2018-04-27 (×7): 20 mg via ORAL
  Filled 2018-04-20 (×7): qty 1

## 2018-04-20 MED ORDER — TRAZODONE HCL 50 MG PO TABS
150.0000 mg | ORAL_TABLET | Freq: Every day | ORAL | Status: DC
  Administered 2018-04-20 – 2018-04-26 (×6): 150 mg via ORAL
  Filled 2018-04-20 (×6): qty 1

## 2018-04-20 MED ORDER — OXYCODONE-ACETAMINOPHEN 5-325 MG PO TABS
1.0000 | ORAL_TABLET | Freq: Every day | ORAL | Status: DC
  Administered 2018-04-20 – 2018-04-27 (×29): 1 via ORAL
  Filled 2018-04-20 (×33): qty 1

## 2018-04-20 MED ORDER — CLOPIDOGREL BISULFATE 75 MG PO TABS
75.0000 mg | ORAL_TABLET | Freq: Every day | ORAL | Status: DC
  Administered 2018-04-21 – 2018-04-25 (×5): 75 mg via ORAL
  Filled 2018-04-20 (×7): qty 1

## 2018-04-20 MED ORDER — MIRABEGRON ER 25 MG PO TB24
25.0000 mg | ORAL_TABLET | Freq: Every day | ORAL | Status: DC
  Administered 2018-04-21 – 2018-04-27 (×7): 25 mg via ORAL
  Filled 2018-04-20 (×7): qty 1

## 2018-04-20 MED ORDER — PIPERACILLIN-TAZOBACTAM 3.375 G IVPB 30 MIN
3.3750 g | Freq: Once | INTRAVENOUS | Status: AC
  Administered 2018-04-20: 15:00:00 3.375 g via INTRAVENOUS
  Filled 2018-04-20: qty 50

## 2018-04-20 MED ORDER — TIOTROPIUM BROMIDE MONOHYDRATE 18 MCG IN CAPS
1.0000 | ORAL_CAPSULE | Freq: Every day | RESPIRATORY_TRACT | Status: DC
  Administered 2018-04-21 – 2018-04-27 (×6): 18 ug via RESPIRATORY_TRACT
  Filled 2018-04-20 (×2): qty 5

## 2018-04-20 MED ORDER — PIPERACILLIN-TAZOBACTAM 3.375 G IVPB
3.3750 g | Freq: Three times a day (TID) | INTRAVENOUS | Status: DC
  Administered 2018-04-20: 3.375 g via INTRAVENOUS
  Filled 2018-04-20: qty 50

## 2018-04-20 MED ORDER — LACTATED RINGERS IV SOLN
INTRAVENOUS | Status: DC
  Administered 2018-04-20: 75 mL/h via INTRAVENOUS
  Administered 2018-04-21: 09:00:00 via INTRAVENOUS

## 2018-04-20 MED ORDER — ACETAMINOPHEN 650 MG RE SUPP: 650.0000 mg | Freq: Four times a day (QID) | RECTAL | Status: DC | PRN

## 2018-04-20 MED ORDER — LEVOTHYROXINE SODIUM 88 MCG PO TABS
88.0000 ug | ORAL_TABLET | Freq: Every day | ORAL | Status: DC
  Administered 2018-04-21 – 2018-04-27 (×7): 88 ug via ORAL
  Filled 2018-04-20 (×8): qty 1

## 2018-04-20 MED ORDER — VANCOMYCIN HCL IN DEXTROSE 1-5 GM/200ML-% IV SOLN: 1000.0000 mg | INTRAVENOUS | Status: DC

## 2018-04-20 MED ORDER — INSULIN GLARGINE 100 UNIT/ML ~~LOC~~ SOLN
46.0000 [IU] | Freq: Every day | SUBCUTANEOUS | Status: DC
  Filled 2018-04-20: qty 0.46

## 2018-04-20 MED ORDER — VANCOMYCIN HCL IN DEXTROSE 750-5 MG/150ML-% IV SOLN
750.0000 mg | Freq: Once | INTRAVENOUS | Status: AC
  Administered 2018-04-20: 750 mg via INTRAVENOUS
  Filled 2018-04-20: qty 150

## 2018-04-20 MED ORDER — ONDANSETRON HCL 4 MG/2ML IJ SOLN: 4.0000 mg | Freq: Four times a day (QID) | INTRAMUSCULAR | Status: DC | PRN

## 2018-04-20 MED ORDER — ALBUTEROL SULFATE HFA 108 (90 BASE) MCG/ACT IN AERS
2.0000 | INHALATION_SPRAY | RESPIRATORY_TRACT | Status: DC | PRN
  Administered 2018-04-25: 2 via RESPIRATORY_TRACT
  Filled 2018-04-20: qty 6.7

## 2018-04-20 NOTE — Consult Note (Signed)
ANTICOAGULATION CONSULT NOTE  Pharmacy Consult for Warfarin  Indication: VTE Treatment  Allergies  Allergen Reactions  . Skin Protectants, Misc. Rash  . Flagyl [Metronidazole]   . Gabapentin   . Levetiracetam   . Tape Other (See Comments)    blisters  . Ace Inhibitors Rash  . Ertapenem Rash  . Sertraline Hcl Rash    Patient Measurements: Height: 5\' 3"  (160 cm) Weight: 157 lb (71.2 kg) IBW/kg (Calculated) : 52.4   Vital Signs: Temp: 98.7 F (37.1 C) (04/01 1428) Temp Source: Oral (04/01 1428) BP: 122/64 (04/01 1623) Pulse Rate: 83 (04/01 1623)  Labs: Recent Labs    04/20/18 1429 04/20/18 1654  HGB 13.0  --   HCT 39.8  --   PLT 320  --   LABPROT  --  >90.0*  INR  --  >10.0*  CREATININE 1.16*  --   TROPONINI 0.99*  --     Estimated Creatinine Clearance: 50.6 mL/min (A) (by C-G formula based on SCr of 1.16 mg/dL (H)).   Medical History: Past Medical History:  Diagnosis Date  . Asthma   . CHF (congestive heart failure) (HCC)   . Chronic pain   . COPD (chronic obstructive pulmonary disease) (HCC)   . Diabetes mellitus (HCC)   . Peripheral vascular disease (HCC)   . Stroke (cerebrum) (HCC)     Medications:  (Not in a hospital admission)  Scheduled:  . insulin aspart  0-20 Units Subcutaneous TID WC  . insulin aspart  0-5 Units Subcutaneous QHS   Infusions:  . piperacillin-tazobactam (ZOSYN)  IV    . [START ON 04/21/2018] vancomycin    . vancomycin     PTA warfarin:  Last given 04/19/2018 at 2000   Assessment: Given patient's INR value, will need to hold warfarin dose for today. Additionally, her INR is likely elevated d/t infection. Discussed with Dr. Cherlynn Kaiser her INR and will not give vitamin K at this time. Will closely monitor her INR.   Goal of Therapy:  INR 2-3  Monitor platelets by anticoagulation protocol: Yes   Plan:  Hold warfarin for today. Will obtain INR in the morning and evaluated CBC with AM labs.   Katha Cabal,  PharmD 04/20/2018,5:02 PM

## 2018-04-20 NOTE — ED Provider Notes (Signed)
[facility] Emergency Department Provider Note       Time seen: ----------------------------------------- 2:35 PM on 04/20/2018 -----------------------------------------   I have reviewed the triage vital signs and the nursing notes.  HISTORY   Chief Complaint Hyperglycemia and Urinary Tract Infection   HPI Alexandra Goodman is a 58 y.o. female with a history of asthma, CHF, COPD who presents to the ED for multiple complaints.  Patient was told to be seen in the ER due to bacteria in her urine with suspected kidney stones.  She was also found to be possibly hypoxic.  She typically wears 3 L of nasal cannula oxygen all the time but her oxygen level was 88%.  When the nasal cannula was changed in route she was 94%.  She states she is no longer short of breath.  She denies any fever.  Past Medical History:  Diagnosis Date  . Asthma   . CHF (congestive heart failure) (HCC)   . COPD (chronic obstructive pulmonary disease) (HCC)     There are no active problems to display for this patient.   Past Surgical History:  Procedure Laterality Date  . below the knee LLE amputation Left     Allergies Ace inhibitors; Flagyl [metronidazole]; Gabapentin; Levetiracetam; and Sertraline hcl  Social History Social History   Tobacco Use  . Smoking status: Not on file  Substance Use Topics  . Alcohol use: Not on file  . Drug use: Not on file   Review of Systems Constitutional: Negative for fever. Cardiovascular: Negative for chest pain. Respiratory: Negative for shortness of breath. Gastrointestinal: Negative for abdominal pain, vomiting and diarrhea. Musculoskeletal: Positive for back pain Skin: Negative for rash. Neurological: Negative for headaches, focal weakness or numbness.  All systems negative/normal/unremarkable except as stated in the HPI  ____________________________________________   PHYSICAL EXAM:  VITAL SIGNS: ED Triage Vitals  Enc Vitals  Group     BP --      Pulse --      Resp --      Temp 04/20/18 1428 98.7 F (37.1 C)     Temp Source 04/20/18 1428 Oral     SpO2 --      Weight 04/20/18 1430 157 lb (71.2 kg)     Height 04/20/18 1430 5\' 3"  (1.6 m)     Head Circumference --      Peak Flow --      Pain Score 04/20/18 1428 8     Pain Loc --      Pain Edu? --      Excl. in GC? --    Constitutional: Alert and oriented.  Chronically ill-appearing, no distress Eyes: Conjunctivae are normal. Normal extraocular movements. ENT      Head: Normocephalic and atraumatic.      Nose: No congestion/rhinnorhea.      Mouth/Throat: Mucous membranes are moist.      Neck: No stridor. Cardiovascular: Normal rate, regular rhythm. No murmurs, rubs, or gallops. Respiratory: Normal respiratory effort without tachypnea nor retractions. Gastrointestinal: Soft and nontender. Normal bowel sounds Musculoskeletal: Nontender with normal range of motion in extremities.  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 No gross focal neurologic deficits are appreciated.  Skin:  Skin is warm, dry and intact. No rash noted. Psychiatric: Mood and affect are normal.  ____________________________________________  EKG: Interpreted by me.  Sinus rhythm rate 85 bpm, normal PR interval, normal QRS, normal QT  ____________________________________________  ED COURSE:  As part  of my medical decision making, I reviewed the following data within the electronic MEDICAL RECORD NUMBER History obtained from family if available, nursing notes, old chart and ekg, as well as notes from prior ED visits. Patient presented for likely UTI or kidney stones and was found to be hyperglycemic, we will assess with labs and imaging as indicated at this time.   Procedures Alexandra Goodman was evaluated in Emergency Department on 04/20/2018 for the symptoms described in the history of present illness. She was evaluated in the context of the  global COVID-19 pandemic, which necessitated consideration that the patient might be at risk for infection with the SARS-CoV-2 virus that causes COVID-19. Institutional protocols and algorithms that pertain to the evaluation of patients at risk for COVID-19 are in a state of rapid change based on information released by regulatory bodies including the CDC and federal and state organizations. These policies and algorithms were followed during the patient's care in the ED.  ____________________________________________   LABS (pertinent positives/negatives)  Labs Reviewed  GLUCOSE, CAPILLARY - Abnormal; Notable for the following components:      Result Value   Glucose-Capillary >600 (*)    All other components within normal limits  LACTIC ACID, PLASMA - Abnormal; Notable for the following components:   Lactic Acid, Venous 2.0 (*)    All other components within normal limits  COMPREHENSIVE METABOLIC PANEL - Abnormal; Notable for the following components:   Sodium 124 (*)    Chloride 88 (*)    Glucose, Bld 650 (*)    BUN 62 (*)    Creatinine, Ser 1.16 (*)    Total Protein 6.1 (*)    Albumin 2.1 (*)    Alkaline Phosphatase 152 (*)    GFR calc non Af Amer 52 (*)    All other components within normal limits  CBC WITH DIFFERENTIAL/PLATELET - Abnormal; Notable for the following components:   WBC 22.0 (*)    RBC 5.18 (*)    MCV 76.8 (*)    MCH 25.1 (*)    RDW 18.4 (*)    Neutro Abs 20.1 (*)    Abs Immature Granulocytes 0.15 (*)    All other components within normal limits  BLOOD GAS, VENOUS - Abnormal; Notable for the following components:   Bicarbonate 28.8 (*)    Acid-Base Excess 2.4 (*)    All other components within normal limits  TROPONIN I - Abnormal; Notable for the following components:   Troponin I 0.99 (*)    All other components within normal limits  CULTURE, BLOOD (ROUTINE X 2)  CULTURE, BLOOD (ROUTINE X 2)  URINE CULTURE  NOVEL CORONAVIRUS, NAA (HOSPITAL ORDER, SEND-OUT  TO REF LAB)  LACTIC ACID, PLASMA  URINALYSIS, COMPLETE (UACMP) WITH MICROSCOPIC   CRITICAL CARE Performed by: Ulice Dash   Total critical care time: 30 minutes  Critical care time was exclusive of separately billable procedures and treating other patients.  Critical care was necessary to treat or prevent imminent or life-threatening deterioration.  Critical care was time spent personally by me on the following activities: development of treatment plan with patient and/or surrogate as well as nursing, discussions with consultants, evaluation of patient's response to treatment, examination of patient, obtaining history from patient or surrogate, ordering and performing treatments and interventions, ordering and review of laboratory studies, ordering and review of radiographic studies, pulse oximetry and re-evaluation of patient's condition.  RADIOLOGY Images were viewed by me  CT renal protocol CXR IMPRESSION: 1. Hazy left  lower lobe airspace disease. Small area of airspace disease in the left upper lobe. Findings are concerning for an infectious etiology.  ____________________________________________   DIFFERENTIAL DIAGNOSIS   Renal colic, UTI, pyelonephritis, sepsis, pneumonia, COPD  FINAL ASSESSMENT AND PLAN  Sepsis, pneumonia   Plan: The patient had presented for multiple complaints. Patient's labs revealed numerous abnormalities concerning for sepsis secondary to pneumonia but also likely secondary to the UTI.  Outpatient urinalysis revealed clinically significant colony-forming units for staph. Patient's imaging did reveal a left upper and left lower lobe airspace disease.  She was given broad-spectrum antibiotics.  I will discuss with the hospitalist for admission.  Currently she is normotensive and not tachycardic.   Ulice Dash, MD    Note: This note was generated in part or whole with voice recognition software. Voice recognition is usually quite  accurate but there are transcription errors that can and very often do occur. I apologize for any typographical errors that were not detected and corrected.     Emily Filbert, MD 04/20/18 [phone]

## 2018-04-20 NOTE — ED Notes (Signed)
PT o2 sat noted to drop while having a conversation on the phone, oxygen increased to 4L/min, O2 saturation remained decreased at 88%. Pt instructed to stop and take deep breaths in through nose and o2 back up to 96%. Oxygen therapy replaced back to 3L/min.

## 2018-04-20 NOTE — ED Notes (Signed)
Negative pressure room tested with tissue test, negative pressure is currently intact. Witnessed by charge RN greg [employer]

## 2018-04-20 NOTE — ED Notes (Signed)
ED TO INPATIENT HANDOFF REPORT  ED Nurse Name and Phone #: Dedee Liss [phone]  S Name/Age/Gender Alexandra Goodman 58 y.o. female Room/Bed: ED06A/ED06A  Code Status   Code Status: Not on file  Home/SNF/Other Home Patient oriented to: self, place, time and situation Is this baseline? Yes   Triage Complete: Triage complete  Chief Complaint uti  Triage Note PT to ED via EMS from home. PT presenting with multiple complaints. Pt was told to be seen in ER by PCP d/t bacteria in urine. Upon arrival by fire, pt O2 sat was 88%, however they switched her nasal cannula for their nasal cannula and pt came up to 94%. Pt wears oxygen chronically. Pt c/o neuropathy pain, denied fever for EMS, stated previous fever to Dr. Mayford Knife. PT keenly responsive at this time.    Allergies Allergies  Allergen Reactions  . Skin Protectants, Misc. Rash  . Flagyl [Metronidazole]   . Gabapentin   . Levetiracetam   . Tape Other (See Comments)    blisters  . Ace Inhibitors Rash  . Ertapenem Rash  . Sertraline Hcl Rash    Level of Care/Admitting Diagnosis ED Disposition    ED Disposition Condition Comment   Admit  Hospital Area: [facility] [[id number]]  Level of Care: Med-Surg [16]  Diagnosis: Sepsis [facility]) [[id number]]  Admitting Physician: Houston Siren [[id number]]  Attending Physician: Houston Siren [[id number]]  Estimated length of stay: past midnight tomorrow  Certification:: I certify this patient will need inpatient services for at least 2 midnights  PT Class (Do Not Modify): Inpatient [101]  PT Acc Code (Do Not Modify): Private [1]       B Medical/Surgery History Past Medical History:  Diagnosis Date  . Asthma   . CHF (congestive heart failure) (HCC)   . Chronic pain   . COPD (chronic obstructive pulmonary disease) (HCC)   . Diabetes mellitus (HCC)   . Peripheral vascular disease (HCC)   . Stroke (cerebrum) [facility])    Past Surgical History:  Procedure Laterality Date  .  below the knee LLE amputation Left      A IV Location/Drains/Wounds Patient Lines/Drains/Airways Status   Active Line/Drains/Airways    Name:   Placement date:   Placement time:   Site:   Days:   Peripheral IV 04/20/18 Left Forearm   04/20/18    1435    Forearm   less than 1   Peripheral IV 04/20/18 Right Forearm   04/20/18    1500    Forearm   less than 1          Intake/Output Last 24 hours  Intake/Output Summary (Last 24 hours) at 04/20/2018 1645 Last data filed at 04/20/2018 1608 Gross per 24 hour  Intake 600 ml  Output -  Net 600 ml    Labs/Imaging Results for orders placed or performed during the hospital encounter of 04/20/18 (from the past 48 hour(s))  Lactic acid, plasma     Status: Abnormal   Collection Time: 04/20/18  2:29 PM  Result Value Ref Range   Lactic Acid, Venous 2.0 (HH) 0.5 - 1.9 mmol/L    Comment: CRITICAL RESULT CALLED TO, READ BACK BY AND VERIFIED WITH AMY COYNE @1507  04/20/2018 MU/MLK Performed at [facility], [address] [place], [place] [address]   Comprehensive metabolic panel     Status: Abnormal   Collection Time: 04/20/18  2:29 PM  Result Value Ref Range   Sodium 124 (L) 135 - 145 mmol/L  Potassium 5.0 3.5 - 5.1 mmol/L   Chloride 88 (L) 98 - 111 mmol/L   CO2 25 22 - 32 mmol/L   Glucose, Bld 650 (HH) 70 - 99 mg/dL    Comment: CRITICAL RESULT CALLED TO, READ BACK BY AND VERIFIED WITH AMY COYNE  04/20/2018 MU/MLK    BUN 62 (H) 6 - 20 mg/dL   Creatinine, Ser 1.32 (H) 0.44 - 1.00 mg/dL   Calcium 8.9 8.9 - 44.0 mg/dL   Total Protein 6.1 (L) 6.5 - 8.1 g/dL   Albumin 2.1 (L) 3.5 - 5.0 g/dL   AST 21 15 - 41 U/L   ALT 22 0 - 44 U/L   Alkaline Phosphatase 152 (H) 38 - 126 U/L   Total Bilirubin 0.7 0.3 - 1.2 mg/dL   GFR calc non Af Amer 52 (L) >60 mL/min   GFR calc Af Amer >60 >60 mL/min   Anion gap 11 5 - 15    Comment: Performed at [facility], [address] [place], [place] [address]  CBC WITH DIFFERENTIAL      Status: Abnormal   Collection Time: 04/20/18  2:29 PM  Result Value Ref Range   WBC 22.0 (H) 4.0 - 10.5 K/uL   RBC 5.18 (H) 3.87 - 5.11 MIL/uL   Hemoglobin 13.0 12.0 - 15.0 g/dL   HCT 53.6 64.4 - 03.4 %   MCV 76.8 (L) 80.0 - 100.0 fL   MCH 25.1 (L) 26.0 - 34.0 pg   MCHC 32.7 30.0 - 36.0 g/dL   RDW 74.2 (H) 59.5 - 63.8 %   Platelets 320 150 - 400 K/uL   nRBC 0.0 0.0 - 0.2 %   Neutrophils Relative % 91 %   Neutro Abs 20.1 (H) 1.7 - 7.7 K/uL   Lymphocytes Relative 3 %   Lymphs Abs 0.7 0.7 - 4.0 K/uL   Monocytes Relative 4 %   Monocytes Absolute 0.8 0.1 - 1.0 K/uL   Eosinophils Relative 1 %   Eosinophils Absolute 0.2 0.0 - 0.5 K/uL   Basophils Relative 0 %   Basophils Absolute 0.1 0.0 - 0.1 K/uL   Immature Granulocytes 1 %   Abs Immature Granulocytes 0.15 (H) 0.00 - 0.07 K/uL    Comment: Performed at [facility], [address] [place], [place] [address]  Troponin I - ONCE - STAT     Status: Abnormal   Collection Time: 04/20/18  2:29 PM  Result Value Ref Range   Troponin I 0.99 (HH) <0.03 ng/mL    Comment: CRITICAL RESULT CALLED TO, READ BACK BY AND VERIFIED WITH AMY COYNE  04/20/2018 MU/MLK Performed at [facility], [address] [place], [place] [address]   Glucose, capillary     Status: Abnormal   Collection Time: 04/20/18  2:32 PM  Result Value Ref Range   Glucose-Capillary >600 (HH) 70 - 99 mg/dL  Blood gas, venous ([facility], [facility], [facility])     Status: Abnormal   Collection Time: 04/20/18  2:34 PM  Result Value Ref Range   pH, Ven 7.36 7.250 - 7.430   pCO2, Ven 51 44.0 - 60.0 mmHg   pO2, Ven 43.0 32.0 - 45.0 mmHg   Bicarbonate 28.8 (H) 20.0 - 28.0 mmol/L   Acid-Base Excess 2.4 (H) 0.0 - 2.0 mmol/L   O2 Saturation 76.4 %   Patient temperature 37.0    Collection site VEIN    Sample type VENOUS     Comment: Performed at [facility], [address] [place], [place] [address]  Urinalysis, Complete w Microscopic     Status: Abnormal    Collection Time: 04/20/18  2:46 PM  Result Value Ref Range   Color, Urine YELLOW (A) YELLOW   APPearance HAZY (A) CLEAR   Specific Gravity, Urine 1.016 1.005 - 1.030   pH 6.0 5.0 - 8.0   Glucose, UA >=500 (A) NEGATIVE mg/dL   Hgb urine dipstick MODERATE (A) NEGATIVE   Bilirubin Urine NEGATIVE NEGATIVE   Ketones, ur NEGATIVE NEGATIVE mg/dL   Protein, ur NEGATIVE NEGATIVE mg/dL   Nitrite POSITIVE (A) NEGATIVE   Leukocytes,Ua SMALL (A) NEGATIVE   RBC / HPF 0-5 0 - 5 RBC/hpf   WBC, UA 11-20 0 - 5 WBC/hpf   Bacteria, UA NONE SEEN NONE SEEN   Squamous Epithelial / LPF 0-5 0 - 5   WBC Clumps PRESENT    Mucus PRESENT     Comment: Performed at [facility], [address] [place], [place] [address]  Glucose, capillary     Status: Abnormal   Collection Time: 04/20/18  4:37 PM  Result Value Ref Range   Glucose-Capillary >600 (HH) 70 - 99 mg/dL   Dg Chest Port 1 View  Result Date: 04/20/2018 CLINICAL DATA:  Fever, multiple complaint EXAM: PORTABLE CHEST 1 VIEW COMPARISON:  01/14/2013 FINDINGS: Mild bilateral interstitial thickening. Hazy left lower lobe airspace disease. Small area of airspace disease in the left upper lobe. Possible small left pleural effusion. No right pleural effusion. No pneumothorax. Stable cardiomediastinal silhouette. Thoracic aortic atherosclerosis. No acute osseous abnormality. IMPRESSION: 1. Hazy left lower lobe airspace disease. Small area of airspace disease in the left upper lobe. Findings are concerning for an infectious etiology. Electronically Signed   By: Elige Ko   On: 04/20/2018 15:08   Ct Renal Stone Study  Result Date: 04/20/2018 CLINICAL DATA:  58 year old female with abdominal pain EXAM: CT ABDOMEN AND PELVIS WITHOUT CONTRAST TECHNIQUE: Multidetector CT imaging of the abdomen and pelvis was performed following the standard protocol without IV contrast. COMPARISON:  None. FINDINGS: Lower chest: Respiratory motion limits evaluation. Atelectasis at  the left lung base. Hepatobiliary: Cranial caudal span of the right liver measures greater than 18 cm. No focal lesion or nodular contour. Cholecystectomy. No intrahepatic or extrahepatic biliary ductal dilatation. Pancreas: Unremarkable pancreas Spleen: Unremarkable spleen Adrenals/Urinary Tract: Unremarkable adrenal glands. Right kidney demonstrates regions of cortical thinning. No hydronephrosis. Vascular calcifications in the hilum of the right kidney. Unremarkable course of the right ureter. Left kidney demonstrates no hydronephrosis. Likely nonobstructive stone in the inferior collecting system, versus vascular calcifications. Unremarkable course the left ureter. Urinary bladder partially distended. Stomach/Bowel: Unremarkable stomach. Unremarkable small bowel. Moderate to large formed stool burden. No focal inflammatory changes. Normal appendix. Vascular/Lymphatic: Vascular calcifications of the aorta and bilateral iliac arteries. Venous collaterals on the anterior pelvic wall favored to reflect chronic left iliac main stenosis/occlusion. Reproductive: Unremarkable appearance of uterus. Other: Hazy edema/infiltration of the musculature associated with the left eye fragment a cruise. There are small gas locules present. This is in continuity with the anterior aspect of the L1-L2 disc space, which appears relatively widened compared to adjacent disc spaces. Musculoskeletal: Vacuum disc phenomenon at T12-L1. Compression fracture of L1. Irregularity of the inferior endplate with questionable disc space widening of L1-L2. Ill-defined soft tissue extends from the anterior aspect of the L1-L2 disc space into the left eye frag medic cruise. Degenerative changes of the lower lumbar spine. Vacuum disc phenomenon at L4-L5. IMPRESSION: Soft  tissue changes of the left diaphragmatic cruz with small gas locules concerning for infection with gas-forming organism. This is in continuity with the anterior aspect of the L1-L2  disc space, and the source may be secondary to discitis/osteomyelitis at this site. Evaluation with MRI with contrast may be useful. Compression fracture of L1 of indeterminate age. These results were discussed by telephone at the time of interpretation on 04/20/2018 at 4:10 pm with Dr. Sharma Covert. Aortic Atherosclerosis (ICD10-I70.0). 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 Correlation with a prior history of DVT may be useful. Hepatomegaly. Electronically Signed   By: Gilmer Mor D.O.   On: 04/20/2018 16:14    Pending Labs Unresulted Labs (From admission, onward)    Start     Ordered   04/20/18 1528  Novel Coronavirus, NAA (hospital order; send-out to ref lab)  (Novel Coronavirus, NAA [facility] Order; send-out to ref lab) with precautions panel)  Once,   STAT    Question Answer Comment  Current symptoms Fever and Cough   Excluded other viral illnesses No (testing not indicated)      04/20/18 1527   04/20/18 1434  Lactic acid, plasma  STAT Now then every 3 hours,   STAT     04/20/18 1434   04/20/18 1434  Blood Culture (routine x 2)  BLOOD CULTURE X 2,   STAT     04/20/18 1434   04/20/18 1434  Urine culture  ONCE - STAT,   STAT     04/20/18 1434   Signed and Held  HIV antibody (Routine Testing)  Once,   R     Signed and Held   Signed and Held  Basic metabolic panel  Tomorrow morning,   R     Signed and Held   Signed and Held  CBC  Tomorrow morning,   R     Signed and Held   Signed and Held  Hemoglobin A1c  Add-on,   R     Signed and Held   Signed and Held  Troponin I - Now Then Q4H  Now then every 4 hours,   R     Signed and Held          Vitals/Pain Today's Vitals   04/20/18 1428 04/20/18 1430 04/20/18 1500 04/20/18 1623  BP:   118/87 122/64  Pulse:    83  Resp:   16 19  Temp: 98.7 F (37.1 C)     TempSrc: Oral     SpO2:    91%  Weight:  71.2 kg    Height:   (1.6 m)    PainSc: 8        Isolation Precautions Airborne and Contact  precautions  Medications Medications  insulin aspart (novoLOG) injection 0-20 Units (has no administration in time range)  insulin aspart (novoLOG) injection 0-5 Units (has no administration in time range)  sodium chloride 0.9 % bolus 500 mL (0 mLs Intravenous Stopped 04/20/18 1608)  vancomycin (VANCOCIN) IVPB 1000 mg/200 mL premix (0 mg Intravenous Stopped 04/20/18 1627)  piperacillin-tazobactam (ZOSYN) IVPB 3.375 g (0 g Intravenous Stopped 04/20/18 1559)    Mobility manual wheelchair Moderate fall risk   Focused Assessments Pulmonary Assessment Handoff:  Lung sounds: Bilateral Breath Sounds: Diminished O2 Device: Nasal Cannula O2 Flow Rate (L/min): 4 L/min      R Recommendations: See Admitting Provider Note  Report given to:   Additional Notes: PT did not come in with prosthesis. Needs to be reminded to take deep  breaths

## 2018-04-20 NOTE — ED Notes (Signed)
Upon walking into pt's room approx 5:25pm, pt's heart rate was noted to be in a.fib with RVR. Charge nurse made aware, EKG obtained, verbal orders from Dr. Nevin Bloodgood for 10mg  cardizem. Pt is not c/o SOB or new chest pain. +1 pulses in upper extremities. RN to remain with pt. Floor made aware of delay in pt coming upstairs

## 2018-04-20 NOTE — ED Notes (Signed)
Patient transported to CT 

## 2018-04-20 NOTE — ED Notes (Signed)
Admitting MD has been paged about CBG

## 2018-04-20 NOTE — ED Notes (Signed)
Attempted to call report, floor refused to take report on pt. This RN provided name and number for call back. Will call back if not contacted

## 2018-04-20 NOTE — Consult Note (Addendum)
Pharmacy Antibiotic Note  Alexandra Goodman is a 58 y.o. female admitted on 04/20/2018 with sepsis.  Pharmacy has been consulted for Zosyn/Vancomycin dosing.Patient was given Zosyn and 1 g Vancomycin dose in the ED. Patient will need remaining loading dose of vancomycin prior to beginning the maintenance dose.    Plan: Remaining loading dose of 750 mg x1 (for a total of 1750 mg), then start maintenance dose in 24 hours.  Maintenance dose: Vancomycin 1000 mg IV every 24 hours. Goal trough 15-20 mcg/mL.  Expected AUC: 474.2 Zosyn 3.375g IV q8h (4 hour infusion).  Height: 5\' 3"  (160 cm) Weight: 157 lb (71.2 kg) IBW/kg (Calculated) : 52.4  Scr. 1.16 (1.00 baseline on 05/05/2016)  Temp (24hrs), Avg:98.7 F (37.1 C), Min:98.7 F (37.1 C), Max:98.7 F (37.1 C)  Recent Labs  Lab 04/20/18 1429  WBC 22.0*  CREATININE 1.16*  LATICACIDVEN 2.0*    Estimated Creatinine Clearance: 50.6 mL/min (A) (by C-G formula based on SCr of 1.16 mg/dL (H)).    Allergies  Allergen Reactions  . Skin Protectants, Misc. Rash  . Flagyl [Metronidazole]   . Gabapentin   . Levetiracetam   . Tape Other (See Comments)    blisters  . Ace Inhibitors Rash  . Ertapenem Rash  . Sertraline Hcl Rash    Antimicrobials this admission: Vancomycin 04/01 >>  Zosyn 04/01 >>   Dose adjustments this admission: Added remaining loading dose.   Microbiology results: BCx 4/1: pending  UCx 4/1: pending  COVID 4/1: pending   Thank you for allowing pharmacy to be a part of this patient's care.  Katha Cabal 04/20/2018 4:52 PM

## 2018-04-20 NOTE — Progress Notes (Signed)
Before being transferred to the floor pt. Was noted to be in a. Fib w/ RVR with HR in the 160's to 170's.    Pt. Was given 10 mg IV Cardizem but HR remained elevated.  Will start patient on Cardizem gtt and change admission to Stepdown.   Will get Cardiology consult and notified Dr. Juliann Pares via [facility] text. Also notified Intensivist about the admission.   ER nursing staff notified.    Time spent: 25 min.

## 2018-04-20 NOTE — H&P (Addendum)
[employer] [facility] [place] [facility]    PATIENT NAME: Alexandra Goodman    MR#:  [id number]  DATE OF BIRTH:  December 27, 1960  DATE OF ADMISSION:  04/20/2018  PRIMARY CARE PHYSICIAN: Floreen Comber, MD   REQUESTING/REFERRING PHYSICIAN: Dr. Daryel November  CHIEF COMPLAINT:   Chief Complaint  Patient presents with   Hyperglycemia   Urinary Tract Infection    HISTORY OF PRESENT ILLNESS:  Alexandra Goodman  is a 58 y.o. female with a known history of CHF, COPD, hypertension, diabetes, peripheral vascular disease, diabetic neuropathy, pervious hx of DVT who presents to the hospital as advised by her primary care physician as they thought she had a urinary tract infection and also kidney stones.  Patient says that she has not been feeling well for the past week or so and went to see her primary care physician at [facility] she was complaining more of lower back pain and urinary frequency/hesitancy and they did a urinalysis which was not grossly positive but send off urine cultures and it turned out to be positive for staph aureus.  Patient was advised to come to the ER for further evaluation.  Patient also says that she has been having some abdominal pain but no nausea or vomiting and the pain is located more bilaterally on her flank sides.  She admits to some chills but no documented fever.  She has chronic shortness of breath and it has not been any worse than usual.  She also admits to recent travel history she went to [place] last month for a few days to visit family as her father had passed away.  She said she was feeling fine when she came back from [place] and the symptoms have only begun about less than a week ago.  She presented to the ER and was noted to be in mild acute kidney injury with severe hyperglycemia with leukocytosis and elevated lactic acid and ruled in for sepsis.  Patient's urinalysis is mildly positive and underwent a CT renal stone study which showed no  nephrolithiasis but did show evidence of possible discitis/osteomyelitis in the L1-L2 area.  Hospitalist services were contacted for admission.  PAST MEDICAL HISTORY:   Past Medical History:  Diagnosis Date   Asthma    CHF (congestive heart failure) (HCC)    Chronic pain    COPD (chronic obstructive pulmonary disease) (HCC)    Diabetes mellitus (HCC)    Peripheral vascular disease (HCC)     PAST SURGICAL HISTORY:   Past Surgical History:  Procedure Laterality Date   below the knee LLE amputation Left     SOCIAL HISTORY:   Social History   Tobacco Use   Smoking status: Current Every Day Smoker    Packs/day: 2.00    Years: 45.00    Pack years: 90.00    Types: Cigarettes   Smokeless tobacco: Never Used  Substance Use Topics   Alcohol use: Not Currently    FAMILY HISTORY:   Family History  Problem Relation Age of Onset   Heart disease Mother    Heart disease Father     DRUG ALLERGIES:   Allergies  Allergen Reactions   Skin Protectants, Misc. Rash   Flagyl [Metronidazole]    Gabapentin    Levetiracetam    Tape Other (See Comments)    blisters   Ace Inhibitors Rash   Ertapenem Rash   Sertraline Hcl Rash    REVIEW OF SYSTEMS:   Review of Systems  Constitutional: Negative for  fever and weight loss.  HENT: Negative for congestion, nosebleeds and tinnitus.   Eyes: Negative for blurred vision, double vision and redness.  Respiratory: Positive for cough and shortness of breath. Negative for hemoptysis.   Cardiovascular: Negative for chest pain, orthopnea, leg swelling and PND.  Gastrointestinal: Negative for abdominal pain, diarrhea, melena, nausea and vomiting.  Genitourinary: Positive for flank pain, frequency and urgency. Negative for dysuria and hematuria.  Musculoskeletal: Positive for back pain. Negative for falls and joint pain.  Neurological: Negative for dizziness, tingling, sensory change, focal weakness, seizures, weakness and  headaches.  Endo/Heme/Allergies: Negative for polydipsia. Does not bruise/bleed easily.  Psychiatric/Behavioral: Negative for depression and memory loss. The patient is not nervous/anxious.     MEDICATIONS AT HOME:   Prior to Admission medications   Medication Sig Start Date End Date Taking? Authorizing Provider  morphine (MS CONTIN) 30 MG 12 hr tablet Take 30 mg by mouth every 12 (twelve) hours. 04/15/18 04/20/18 Yes [provider]  oxyCODONE-acetaminophen (PERCOCET) 10-325 MG tablet Take 1 tablet by mouth 5 (five) times daily. 02/01/18  Yes [provider]  warfarin (COUMADIN) 5 MG tablet Take 7.5 mg by mouth daily. 02/02/18  Yes [provider]      VITAL SIGNS:  Blood pressure 122/64, pulse 83, temperature 98.7 F (37.1 C), temperature source Oral, resp. rate 19, height  (1.6 m), weight 71.2 kg, SpO2 91 %.  PHYSICAL EXAMINATION:  Physical Exam  GENERAL:  58 y.o.-year-old patient lying in the bed in no acute distress.  EYES: Pupils equal, round, reactive to light and accommodation. No scleral icterus. Extraocular muscles intact.  HEENT: Head atraumatic, normocephalic. Oropharynx and nasopharynx clear. No oropharyngeal erythema, moist oral mucosa  NECK:  Supple, no jugular venous distention. No thyroid enlargement, no tenderness.  LUNGS: Normal breath sounds bilaterally, no wheezing, rales, rhonchi. No use of accessory muscles of respiration.  CARDIOVASCULAR: S1, S2 RRR. No murmurs, rubs, gallops, clicks.  ABDOMEN: Soft, nontender, nondistended. Bowel sounds present. No organomegaly or mass.  EXTREMITIES: No pedal edema, cyanosis, or clubbing. + 2 pedal & radial pulses b/l.  Left BKA. NEUROLOGIC: Cranial nerves II through XII are intact. No focal Motor or sensory deficits appreciated b/l. Globally weak PSYCHIATRIC: The patient is alert and oriented x 3. SKIN: No obvious rash, lesion, or ulcer.   LABORATORY PANEL:   CBC Recent Labs  Lab  04/20/18 1429  WBC 22.0*  HGB 13.0  HCT 39.8  PLT 320   ------------------------------------------------------------------------------------------------------------------  Chemistries  Recent Labs  Lab 04/20/18 1429  NA 124*  K 5.0  CL 88*  CO2 25  GLUCOSE 650*  BUN 62*  CREATININE 1.16*  CALCIUM 8.9  AST 21  ALT 22  ALKPHOS 152*  BILITOT 0.7   ------------------------------------------------------------------------------------------------------------------  Cardiac Enzymes Recent Labs  Lab 04/20/18 1429  TROPONINI 0.99*   ------------------------------------------------------------------------------------------------------------------  RADIOLOGY:  Dg Chest Port 1 View  Result Date: 04/20/2018 CLINICAL DATA:  Fever, multiple complaint EXAM: PORTABLE CHEST 1 VIEW COMPARISON:  01/14/2013 FINDINGS: Mild bilateral interstitial thickening. Hazy left lower lobe airspace disease. Small area of airspace disease in the left upper lobe. Possible small left pleural effusion. No right pleural effusion. No pneumothorax. Stable cardiomediastinal silhouette. Thoracic aortic atherosclerosis. No acute osseous abnormality. IMPRESSION: 1. Hazy left lower lobe airspace disease. Small area of airspace disease in the left upper lobe. Findings are concerning for an infectious etiology. Electronically Signed   By: Elige Ko   On: 04/20/2018 15:08   Ct Renal  Stone Study  Result Date: 04/20/2018 CLINICAL DATA:  58 year old female with abdominal pain EXAM: CT ABDOMEN AND PELVIS WITHOUT CONTRAST TECHNIQUE: Multidetector CT imaging of the abdomen and pelvis was performed following the standard protocol without IV contrast. COMPARISON:  None. FINDINGS: Lower chest: Respiratory motion limits evaluation. Atelectasis at the left lung base. Hepatobiliary: Cranial caudal span of the right liver measures greater than 18 cm. No focal lesion or nodular contour. Cholecystectomy. No intrahepatic or extrahepatic  biliary ductal dilatation. Pancreas: Unremarkable pancreas Spleen: Unremarkable spleen Adrenals/Urinary Tract: Unremarkable adrenal glands. Right kidney demonstrates regions of cortical thinning. No hydronephrosis. Vascular calcifications in the hilum of the right kidney. Unremarkable course of the right ureter. Left kidney demonstrates no hydronephrosis. Likely nonobstructive stone in the inferior collecting system, versus vascular calcifications. Unremarkable course the left ureter. Urinary bladder partially distended. Stomach/Bowel: Unremarkable stomach. Unremarkable small bowel. Moderate to large formed stool burden. No focal inflammatory changes. Normal appendix. Vascular/Lymphatic: Vascular calcifications of the aorta and bilateral iliac arteries. Venous collaterals on the anterior pelvic wall favored to reflect chronic left iliac main stenosis/occlusion. Reproductive: Unremarkable appearance of uterus. Other: Hazy edema/infiltration of the musculature associated with the left eye fragment a cruise. There are small gas locules present. This is in continuity with the anterior aspect of the L1-L2 disc space, which appears relatively widened compared to adjacent disc spaces. Musculoskeletal: Vacuum disc phenomenon at T12-L1. Compression fracture of L1. Irregularity of the inferior endplate with questionable disc space widening of L1-L2. Ill-defined soft tissue extends from the anterior aspect of the L1-L2 disc space into the left eye frag medic cruise. Degenerative changes of the lower lumbar spine. Vacuum disc phenomenon at L4-L5. IMPRESSION: Soft tissue changes of the left diaphragmatic cruz with small gas locules concerning for infection with gas-forming organism. This is in continuity with the anterior aspect of the L1-L2 disc space, and the source may be secondary to discitis/osteomyelitis at this site. Evaluation with MRI with contrast may be useful. Compression fracture of L1 of indeterminate age. These  results were discussed by telephone at the time of interpretation on 04/20/2018 at 4:10 pm with Dr. Sharma Covert. Aortic Atherosclerosis (ICD10-I70.0). 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 Correlation with a prior history of DVT may be useful. Hepatomegaly. Electronically Signed   By: Gilmer Mor D.O.   On: 04/20/2018 16:14     IMPRESSION AND PLAN:   58 year old female with past medical history of diabetes, hypertension, diabetic neuropathy, COPD with ongoing tobacco abuse, peripheral vascular disease status post left BKA, previous history of nephrolithiasis, history of DVT, chronic back pain who presents to the hospital due to back pain, shortness of breath, weakness.  1.  Sepsis- patient meets sepsis criteria given her leukocytosis, elevated lactic acid, tachycardia and chest x-ray findings suggestive of suspected pneumonia.  Patient's CT renal stone study also is suggestive of possible L1-L2 discitis/osteomyelitis. - We will treat the patient with broad-spectrum IV antibiotics with vancomycin, Zosyn.  Follow blood, urine cultures.  2.  Suspected osteomyelitis/discitis-patient complaining of some back pain and CT renal stone study incidentally showed a L1-L2 area which is suggestive of this. -We will await MRI of the lumbar spine.  Continue broad-spectrum IV antibiotics as mentioned above.  3.  Pneumonia-this was also noted on chest x-ray on admission.  Patient had recent travel history to [place] about a month ago. - Patient is high risk for possible underlying COVID-19.  Placed on contact/droplet precautions. - COVID-19 test has been sent.  Continue antibiotics as mentioned above.  4.  Diabetes type 2 with peripheral vascular disease and neuropathy- BS quite uncontrolled and could be due to underlying sepsis.  - we will resume patient's Lantus and lispro with meals.  Continue sliding scale insulin.  Placed on carb controlled diet.  Follow blood sugars. -Check hemoglobin A1c. Consider  Diabetes Coordinator consult.   5.  Hyponatremia- this is likely pseudohyponatremia from her uncontrolled diabetes and hyperglycemia. - Should improved with IV fluids and answers's blood sugars correct.  6.  Elevated troponin-likely in the setting of supply demand ischemia. -We will observe on telemetry, cycle her markers.  Check an echocardiogram.  7.  Acute kidney injury-secondary to severe hyperglycemia.  -We will hydrate the patient with IV fluids, follow BUN/creatinine urine output.  Renal dose meds, avoid nephrotoxins. Hold Lasix, Losartan for now.  -Patient CT renal study showing no evidence of nephrolithiasis or any evidence of hydronephrosis.  8. Leukocytosis - due to #1 & 2.  - follow with IV abx therapy.    9.  hx of previous DVT-continue Coumadin. -Will have pharmacy dose patient's Coumadin.  10.  Chronic pain-continue patient's MS Contin, Norco.  11.  COPD-no acute exacerbation.  Continue patient's maintenance inhalers.  12. HTN - cont. Metoprolol. Hold Losartan  13. Hypothyroidism - cont. Synthroid.    All the records are reviewed and case discussed with ED provider. Management plans discussed with the patient, family and they are in agreement.  CODE STATUS: Full code  TOTAL TIME TAKING CARE OF THIS PATIENT: 50 minutes.    Houston Siren M.D on 04/20/2018 at 4:29 PM  Between 7am to 6pm - Pager - [phone]  After 6pm go to [email or web address] - password [email or web address] [facility]  [place] [facility]  Office  [phone]  CC: Primary care physician; Floreen Comber, MD

## 2018-04-20 NOTE — ED Notes (Signed)
Admitting MD made aware of CBG and O2 Verbal orders for 20 units of sliding scale insulin

## 2018-04-20 NOTE — Progress Notes (Signed)
This RN confirmed SBAR at [facility] ED RN then called back at 1728 to say transfer is being held d/t increase in pts HR.

## 2018-04-20 NOTE — ED Triage Notes (Signed)
PT to ED via EMS from home. PT presenting with multiple complaints. Pt was told to be seen in ER by PCP d/t bacteria in urine. Upon arrival by fire, pt O2 sat was 88%, however they switched her nasal cannula for their nasal cannula and pt came up to 94%. Pt wears oxygen chronically. Pt c/o neuropathy pain, denied fever for EMS, stated previous fever to Dr. Mayford Knife. PT keenly responsive at this time.

## 2018-04-20 NOTE — Progress Notes (Signed)
   [employer] - [place] at [facility]   Advance care planning  Hospital Day: 0 days Alexandra Goodman is a 58 y.o. female with history of peripheral vascular disease, diabetes, COPD with ongoing tobacco abuse, hypertension, chronic back pain presenting with Hyperglycemia and Urinary Tract Infection .   Discussed CODE STATUS with the patient at bedside.  Discussed options of DNR versus full code.  Patient has multiple comorbidities and is presenting with multiple issues.  Patient would like to be a full code and continue aggressive care for now.  Advance care planning discussed with patient  without additional Family at bedside. All questions in regards to overall condition and expected prognosis answered. The decision was made to continue current code status  CODE STATUS: full Time spent: 16 minutes

## 2018-04-20 NOTE — Consult Note (Signed)
CRITICAL CARE CONSULT      CHIEF COMPLAINT:    Back pain, dysuria, AFrvr, staph aureus infection   HPI   This is a 58 year old female hx of grade 2 diastolic CHF, COPD with centrilobular emphysema as well as bilateral pulmonary nodules and hilar and mediastinal lymphadenopathy on multiple serial CT chest images, below-knee amputation, history of nephrolithiasis, type 2 diabetes with previous skin ulcerations, neuropathy, history of CVA, urinary incontinence, she was recently seen by primary care with complaints of back pain as well as dysuria and was thought to have a UTI however cultures came back positive for staph aureus and patient was subsequently asked to seek medical attention and emergency room.  In the ED patient was found to be in acute on chronic hypoxic respiratory failure with SPO2 of 88% despite having her home oxygen at 3 L/min.  Due to the acute hypoxemia she had undergone nasopharyngeal swabbing for novel coronavirus.  Hospitalist service was consulted to admit patient and was concerned about findings of atrial fibrillation with rapid ventricular response with a heart rate in the 170s.  Due to concern for staph bacteremia as well as discitis as a nidus of infection with superimposed severe hyperglycemia and A. fib RVR patient was transferred to the medical intensive care unit and critical care was consulted to manage patient.  PAST MEDICAL HISTORY   Past Medical History:  Diagnosis Date   Asthma    CHF (congestive heart failure) (HCC)    Chronic pain    COPD (chronic obstructive pulmonary disease) ([place])    Diabetes mellitus ([place])    Peripheral vascular disease ([place])    Stroke (cerebrum) ([place])      SURGICAL HISTORY   Past Surgical History:  Procedure Laterality Date   below the knee LLE  amputation Left      FAMILY HISTORY   Family History  Problem Relation Age of Onset   Heart disease Mother    Heart disease Father      SOCIAL HISTORY   Social History   Tobacco Use   Smoking status: Current Every Day Smoker    Packs/day: 2.00    Years: 45.00    Pack years: 90.00    Types: Cigarettes   Smokeless tobacco: Never Used  Substance Use Topics   Alcohol use: Not Currently   Drug use: Never     MEDICATIONS   Current Medication:  Current Facility-Administered Medications:    diltiazem (CARDIZEM) 100 mg in dextrose 5 % 100  mL (1 mg/mL) infusion, 5-15 mg/hr, Intravenous, Titrated, Sainani, Belia Heman, MD, Last Rate: 5 mL/hr at 04/20/18 1759, 5 mg/hr at 04/20/18 1759   insulin aspart (novoLOG) injection 0-20 Units, 0-20 Units, Subcutaneous, TID WC, Henreitta Leber, MD, 20 Units at 04/20/18 1651   insulin aspart (novoLOG) injection 0-5 Units, 0-5 Units, Subcutaneous, QHS, Sainani, Belia Heman, MD   piperacillin-tazobactam (ZOSYN) IVPB 3.375 g, 3.375 g, Intravenous, Q8H, Duncan, Asajah R, RPH   [START ON 04/21/2018] vancomycin (VANCOCIN) IVPB 1000 mg/200 mL premix, 1,000 mg, Intravenous, Q24H, Duncan, Asajah R, RPH   vancomycin (VANCOCIN) IVPB 750 mg/150 ml premix, 750 mg, Intravenous, Once, Duncan, Asajah R, RPH  Current Outpatient Medications:    albuterol (PROAIR HFA) 108 (90 Base) MCG/ACT inhaler, Inhale 2 puffs into the lungs every 4 (four) hours as needed for wheezing., Disp: , Rfl:    amLODipine (NORVASC) 5 MG tablet, Take 5 mg by mouth daily., Disp: , Rfl:    atorvastatin (LIPITOR) 80 MG tablet, Take 80 mg by mouth Nightly., Disp: , Rfl:    clopidogrel (PLAVIX) 75 MG tablet, Take 75 mg by mouth daily., Disp: , Rfl:    famotidine (PEPCID) 40 MG tablet, Take 40 mg by mouth every evening., Disp: , Rfl:    FLUoxetine (PROZAC) 20 MG capsule, Take 20 mg by mouth daily., Disp: , Rfl:    fluticasone (FLONASE) 50 MCG/ACT nasal spray, Place 1 spray into  both nostrils daily., Disp: , Rfl:    fluticasone-salmeterol (ADVAIR HFA) 230-21 MCG/ACT inhaler, Inhale 2 puffs into the lungs 2 (two) times daily., Disp: , Rfl:    furosemide (LASIX) 40 MG tablet, Take 40-80 mg by mouth 2 (two) times daily. 80 mg every morning and 40 mg every evening, Disp: , Rfl:    Insulin Glargine (LANTUS SOLOSTAR) 100 UNIT/ML Solostar Pen, Inject 46 Units into the skin at bedtime., Disp: , Rfl:    levothyroxine (SYNTHROID, LEVOTHROID) 88 MCG tablet, Take 88 mcg by mouth daily., Disp: , Rfl:    losartan (COZAAR) 100 MG tablet, Take 100 mg by mouth daily., Disp: , Rfl:    metFORMIN (GLUCOPHAGE) 1000 MG tablet, Take 1,000 mg by mouth 2 (two) times daily., Disp: , Rfl:    metoprolol succinate (TOPROL-XL) 100 MG 24 hr tablet, Take 100 mg by mouth daily., Disp: , Rfl:    mirabegron ER (MYRBETRIQ) 25 MG TB24 tablet, Take 25 mg by mouth daily., Disp: , Rfl:    montelukast (SINGULAIR) 10 MG tablet, Take 10 mg by mouth daily., Disp: , Rfl:    morphine (MS CONTIN) 30 MG 12 hr tablet, Take 30 mg by mouth every 12 (twelve) hours., Disp: , Rfl:    naloxone (NARCAN) nasal spray 4 mg/0.1 mL, Place 4 mg into the nose once., Disp: , Rfl:    nitroGLYCERIN (NITROSTAT) 0.4 MG SL tablet, Place 0.4 mg under the tongue as needed., Disp: , Rfl:    oxyCODONE-acetaminophen (PERCOCET) 10-325 MG tablet, Take 1 tablet by mouth 5 (five) times daily., Disp: , Rfl:    predniSONE (DELTASONE) 20 MG tablet, Take 40 mg by mouth daily., Disp: , Rfl:    senna-docusate (SENOKOT-S) 8.6-50 MG tablet, Take 2 tablets by mouth daily., Disp: , Rfl:    tiotropium (SPIRIVA HANDIHALER) 18 MCG inhalation capsule, Place 1 capsule into inhaler and inhale daily., Disp: , Rfl:    traZODone (DESYREL) 50 MG tablet, Take 150 mg by mouth at bedtime., Disp: , Rfl:    warfarin (COUMADIN) 5 MG tablet,  Take 7.5 mg by mouth daily., Disp: , Rfl:     ALLERGIES   Skin protectants, misc.; Flagyl [metronidazole];  Gabapentin; Levetiracetam; Tape; Ace inhibitors; Ertapenem; and Sertraline hcl    REVIEW OF SYSTEMS    10 system ROS was conducted and is negative except as per HPI  PHYSICAL EXAMINATION   Vitals:   04/20/18 1623 04/20/18 1703  BP: 122/64 (!) 121/55  Pulse: 83 84  Resp: 19   Temp:    SpO2: 91% 90%    GENERAL: Mild distress due to acute illness HEAD: Normocephalic, atraumatic.  EYES: Pupils equal, round, reactive to light.  No scleral icterus.  MOUTH: Moist mucosal membrane. NECK: Supple. No thyromegaly. No nodules. No JVD.  PULMONARY: Mild rhonchorous breath sounds, without wheezing CARDIOVASCULAR: Sinus tach difficult to appreciate murmurs  rubs, or gallops.  GASTROINTESTINAL: Soft, nontender, non-distended. No masses. Positive bowel sounds. No hepatosplenomegaly.  MUSCULOSKELETAL: Status post BKA NEUROLOGIC: Mild distress due to acute illness SKIN:intact,warm,dry   LABS AND IMAGING     -I personally reviewed most recent blood work, imaging and microbiology - significant findings today are hyponatremia likely pseudo-due to hyperglycemia, hypochloremia, AKI stage II, leukocytosis  LAB RESULTS: Recent Labs  Lab 04/20/18 1429  NA 124*  K 5.0  CL 88*  CO2 25  BUN 62*  CREATININE 1.16*  GLUCOSE 650*   Recent Labs  Lab 04/20/18 1429  HGB 13.0  HCT 39.8  WBC 22.0*  PLT 320     IMAGING RESULTS: Dg Chest Port 1 View  Result Date: 04/20/2018 CLINICAL DATA:  Fever, multiple complaint EXAM: PORTABLE CHEST 1 VIEW COMPARISON:  01/14/2013 FINDINGS: Mild bilateral interstitial thickening. Hazy left lower lobe airspace disease. Small area of airspace disease in the left upper lobe. Possible small left pleural effusion. No right pleural effusion. No pneumothorax. Stable cardiomediastinal silhouette. Thoracic aortic atherosclerosis. No acute osseous abnormality. IMPRESSION: 1. Hazy left lower lobe airspace disease. Small area of airspace disease in the left upper lobe.  Findings are concerning for an infectious etiology. Electronically Signed   By: Kathreen Devoid   On: 04/20/2018 15:08   Ct Renal Stone Study  Result Date: 04/20/2018 CLINICAL DATA:  58 year old female with abdominal pain EXAM: CT ABDOMEN AND PELVIS WITHOUT CONTRAST TECHNIQUE: Multidetector CT imaging of the abdomen and pelvis was performed following the standard protocol without IV contrast. COMPARISON:  None. FINDINGS: Lower chest: Respiratory motion limits evaluation. Atelectasis at the left lung base. Hepatobiliary: Cranial caudal span of the right liver measures greater than 18 cm. No focal lesion or nodular contour. Cholecystectomy. No intrahepatic or extrahepatic biliary ductal dilatation. Pancreas: Unremarkable pancreas Spleen: Unremarkable spleen Adrenals/Urinary Tract: Unremarkable adrenal glands. Right kidney demonstrates regions of cortical thinning. No hydronephrosis. Vascular calcifications in the hilum of the right kidney. Unremarkable course of the right ureter. Left kidney demonstrates no hydronephrosis. Likely nonobstructive stone in the inferior collecting system, versus vascular calcifications. Unremarkable course the left ureter. Urinary bladder partially distended. Stomach/Bowel: Unremarkable stomach. Unremarkable small bowel. Moderate to large formed stool burden. No focal inflammatory changes. Normal appendix. Vascular/Lymphatic: Vascular calcifications of the aorta and bilateral iliac arteries. Venous collaterals on the anterior pelvic wall favored to reflect chronic left iliac main stenosis/occlusion. Reproductive: Unremarkable appearance of uterus. Other: Hazy edema/infiltration of the musculature associated with the left eye fragment a cruise. There are small gas locules present. This is in continuity with the anterior aspect of the L1-L2 disc space, which appears relatively widened compared to adjacent disc spaces. Musculoskeletal: Vacuum disc  phenomenon at T12-L1. Compression fracture  of L1. Irregularity of the inferior endplate with questionable disc space widening of L1-L2. Ill-defined soft tissue extends from the anterior aspect of the L1-L2 disc space into the left eye frag medic cruise. Degenerative changes of the lower lumbar spine. Vacuum disc phenomenon at L4-L5. IMPRESSION: Soft tissue changes of the left diaphragmatic cruz with small gas locules concerning for infection with gas-forming organism. This is in continuity with the anterior aspect of the L1-L2 disc space, and the source may be secondary to discitis/osteomyelitis at this site. Evaluation with MRI with contrast may be useful. Compression fracture of L1 of indeterminate age. These results were discussed by telephone at the time of interpretation on 04/20/2018 at 4:10 pm with Dr. Mariea Clonts. Aortic Atherosclerosis (ICD10-I70.0). 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 Correlation with a prior history of DVT may be useful. Hepatomegaly. Electronically Signed   By: Corrie Mckusick D.O.   On: 04/20/2018 16:14    Echocardiogram W Colorflow Spectral Doppler1/26/2018 [facility] Component Name Value Ref Range  LV Diastolic Diameter PLAX 4.9 cm  LV Systolic Diameter PLAX 3.0 cm  IVS Diastolic Thickness 1.2 cm  LVPW Diastolic Thickness 1.3 cm  Aortic Root Diameter 2.9 cm  LA Systolic Diameter LX 4.3 cm  LA Area 4C View 17.5 cm2  LA Area 2C View 23.9 cm2  AV Peak Velocity 1.5 m/s  AV Peak Gradient 9.0   Mitral E Point Velocity 0.0 m/s  Mitral A Point Velocity 0.0 m/s  Mitral E to A Ratio 1.1   PV Peak Velocity 1.3 m/s  PV peak gradient 7.00 mmHg  Other Result Information  This result has an attachment that is not available.  Result Narrative   Left ventricular hypertrophy - mild  Normal left ventricular systolic function, ejection fraction 60 to 75%  Diastolic dysfunction - grade II (elevated filling pressures)  Dilated left atrium - mild  Mitral annular calcification  Aortic sclerosis  Normal  right ventricular systolic function  Elevated right atrial pressure      CLINICAL INDICATION: 58 years old Female with Solitary Pulmonary Nodule  - R91.8 - Pulmonary nodules    COMPARISON: 06/22/2016  TECHNIQUE: A spiral CT scan was obtained with IV contrast from the thoracic inlet through the hemidiaphragms. Images were reconstructed in the axial plane.  Coronal and sagittal reformatted images of the chest were also provided for further evaluation of the lung parenchyma.  FINDINGS:   AIRWAYS, LUNGS, PLEURA: Clear central tracheobronchial tree.    Emphysema, predominantly in upper lobes bilaterally. Interval linear atelectasis in the left lingula.  Essentially unchanged multiple bilateral calcified nodules, consistent with granulomas. No new nodules from prior study.   No pleural effusion.  MEDIASTINUM: Normal heart size. Coronary artery atherosclerotic calcifications. Unchanged dilation of the pulmonary trunk, measuring approximately 3.1 cm. No pericardial effusion.   Normal caliber thoracic aorta.    Precarinal lymph node measuring 1.5 (series 2, image 42), right hilar node measuring 1.1 cm (series 2, image 47), aortic lymph node measuring 1.1 cm (series 2, image 37), essentially unchanged.  IMAGED ABDOMEN: See the CT abdomen and pelvis report from 08/06/2017.  SOFT TISSUES: Unremarkable.  BONES: Interval increase in compression of L1 vertebral body fracture since 06/22/2017.   IMPRESSION:  Stable scattered bilateral subcentimeter calcified pulmonary nodules, consistent with calcified granulomas. No new nodules.  Stable indeterminate mediastinal lymphadenopathy is likely reactive.  Interval increased loss of height of L1 vertebral body consistent with worsening compression fracture since 06/22/2017.  ASSESSMENT AND PLAN    -Multidisciplinary rounds held today  Acute on chronic hypoxic Respiratory Failure           -Rule out novel coronavirus  infection -Background history of CHF and COPD- no signs of pulmonary edema on CT or x-ray, no clinical signs of acute COPD exacerbation at this time -continue Full MV support -continue Bronchodilator Therapy -Wean Fio2 and PEEP as tolerated -will perform SAT/SBT when respiratory parameters are met    Mixed acid base disorder with high anion gap metabolic acidosis and metabolic alkalosis         -Complicated by pseudohyponatremia, hypoalbuminemia with hyporchloremia         -Likely due to DKA and sepsis         -Urine ketones pending-initiating phase 1 DKA protocol    Atrial fibrillation with rapid ventricular response                    -Currently on Cardizem gtt. -oxygen as needed -follow up cardiac enzymes as indicated-per his troponin likely due to demand ischemia -trending Cardiology consultation-appreciate recommendations ICU monitoring   Moderate protein calorie malnutrition-albumin 2.1     -Dietary consultation-appreciate recommendation    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            -hx of bilateral pulmonary nodule with hilar and mediastinal lymphadenopathy           - hold bronchodilator therapy for now as patient is in [place]           - outpatient appt made with [facility] pulmonology           -incentive spirometry and flutter valve for BPH    Acute kidney Injury- KDIGO stage I - most likely due to pre-renal azotemia vs ATN -Resuscitative fluids- 20cc/kg due to her CHF status -follow chem 7 -follow UO -continue Foley Catheter-assess need daily   Sepsis   -Due to staph aureus infection-suspect bacteremia with spread to urinary infection -follow ABG and LA -follow up cultures -emperic ABX -consider stress dose steroids   ID -continue IV abx as prescibed -follow up cultures  GI/Nutrition GI PROPHYLAXIS as indicated DIET-->TF's as tolerated Constipation protocol as indicated  ENDO - ICU hypoglycemic\Hyperglycemia  protocol -check FSBS per protocol   ELECTROLYTES -follow labs as needed -replace as needed -pharmacy consultation   DVT/GI PRX ordered -SCDs  TRANSFUSIONS AS NEEDED MONITOR FSBS ASSESS the need for LABS as needed   Critical care provider statement:    Critical care time (minutes):  125   Critical care time was exclusive of:  Separately billable procedures and treating other patients   Critical care was necessary to treat or prevent imminent or life-threatening deterioration of the following conditions:   Severe hyperglycemia, atrial fibrillation with rapid ventricular response, discitis, acute kidney injury, protein calorie malnutrition, sepsis due to UTI with possible bacteremia, acute on chronic hypoxemic respiratory failure with possible novel coronavirus infection.    Critical care was time spent personally by me on the following activities:  Development of treatment plan with patient or surrogate, discussions with consultants, evaluation of patient's response to treatment, examination of patient, obtaining history from patient or surrogate, ordering and performing treatments and interventions, ordering and review of laboratory studies and re-evaluation of patient's condition.  I assumed direction of critical care for this patient from another provider in my specialty: no    This document was prepared using Dragon voice recognition software and may include unintentional  dictation errors.    Ottie Glazier, M.D.  Division of [place]

## 2018-04-21 ENCOUNTER — Inpatient Hospital Stay: Payer: Self-pay

## 2018-04-21 DIAGNOSIS — R0902 Hypoxemia: Secondary | ICD-10-CM

## 2018-04-21 DIAGNOSIS — I4891 Unspecified atrial fibrillation: Secondary | ICD-10-CM

## 2018-04-21 DIAGNOSIS — J441 Chronic obstructive pulmonary disease with (acute) exacerbation: Secondary | ICD-10-CM

## 2018-04-21 DIAGNOSIS — E11621 Type 2 diabetes mellitus with foot ulcer: Secondary | ICD-10-CM

## 2018-04-21 DIAGNOSIS — E1142 Type 2 diabetes mellitus with diabetic polyneuropathy: Secondary | ICD-10-CM

## 2018-04-21 DIAGNOSIS — B9561 Methicillin susceptible Staphylococcus aureus infection as the cause of diseases classified elsewhere: Secondary | ICD-10-CM

## 2018-04-21 DIAGNOSIS — F17210 Nicotine dependence, cigarettes, uncomplicated: Secondary | ICD-10-CM

## 2018-04-21 DIAGNOSIS — Z881 Allergy status to other antibiotic agents status: Secondary | ICD-10-CM

## 2018-04-21 DIAGNOSIS — Z888 Allergy status to other drugs, medicaments and biological substances status: Secondary | ICD-10-CM

## 2018-04-21 DIAGNOSIS — M4646 Discitis, unspecified, lumbar region: Secondary | ICD-10-CM

## 2018-04-21 DIAGNOSIS — Z89512 Acquired absence of left leg below knee: Secondary | ICD-10-CM

## 2018-04-21 DIAGNOSIS — L97519 Non-pressure chronic ulcer of other part of right foot with unspecified severity: Secondary | ICD-10-CM

## 2018-04-21 DIAGNOSIS — Z91048 Other nonmedicinal substance allergy status: Secondary | ICD-10-CM

## 2018-04-21 DIAGNOSIS — R7881 Bacteremia: Secondary | ICD-10-CM

## 2018-04-21 LAB — BASIC METABOLIC PANEL
Anion gap: 9 (ref 5–15)
Anion gap: 10 (ref 5–15)
Anion gap: 13 (ref 5–15)
Anion gap: 10 (ref 5–15)
BUN: 45 mg/dL — ABNORMAL HIGH (ref 6–20)
BUN: 43 mg/dL — ABNORMAL HIGH (ref 6–20)
BUN: 40 mg/dL — ABNORMAL HIGH (ref 6–20)
BUN: 40 mg/dL — ABNORMAL HIGH (ref 6–20)
CO2: 26 mmol/L (ref 22–32)
CO2: 26 mmol/L (ref 22–32)
CO2: 25 mmol/L (ref 22–32)
CO2: 27 mmol/L (ref 22–32)
Calcium: 9.3 mg/dL (ref 8.9–10.3)
Calcium: 9 mg/dL (ref 8.9–10.3)
Calcium: 9 mg/dL (ref 8.9–10.3)
Calcium: 9 mg/dL (ref 8.9–10.3)
Chloride: 97 mmol/L — ABNORMAL LOW (ref 98–111)
Chloride: 95 mmol/L — ABNORMAL LOW (ref 98–111)
Chloride: 93 mmol/L — ABNORMAL LOW (ref 98–111)
Chloride: 94 mmol/L — ABNORMAL LOW (ref 98–111)
Creatinine, Ser: 0.75 mg/dL (ref 0.44–1.00)
Creatinine, Ser: 0.76 mg/dL (ref 0.44–1.00)
Creatinine, Ser: 0.85 mg/dL (ref 0.44–1.00)
Creatinine, Ser: 0.72 mg/dL (ref 0.44–1.00)
GFR calc Af Amer: >60 mL/min (ref >60)
GFR calc Af Amer: >60 mL/min (ref >60)
GFR calc Af Amer: >60 mL/min (ref >60)
GFR calc Af Amer: >60 mL/min (ref >60)
GFR calc non Af Amer: >60 mL/min (ref >60)
GFR calc non Af Amer: >60 mL/min (ref >60)
GFR calc non Af Amer: >60 mL/min (ref >60)
GFR calc non Af Amer: >60 mL/min (ref >60)
Glucose, Bld: 119 mg/dL — ABNORMAL HIGH (ref 70–99)
Glucose, Bld: 194 mg/dL — ABNORMAL HIGH (ref 70–99)
Glucose, Bld: 315 mg/dL — ABNORMAL HIGH (ref 70–99)
Glucose, Bld: 278 mg/dL — ABNORMAL HIGH (ref 70–99)
Potassium: 3.7 mmol/L (ref 3.5–5.1)
Potassium: 4.1 mmol/L (ref 3.5–5.1)
Potassium: 3.9 mmol/L (ref 3.5–5.1)
Potassium: 3.7 mmol/L (ref 3.5–5.1)
Sodium: 132 mmol/L — ABNORMAL LOW (ref 135–145)
Sodium: 131 mmol/L — ABNORMAL LOW (ref 135–145)
Sodium: 131 mmol/L — ABNORMAL LOW (ref 135–145)
Sodium: 131 mmol/L — ABNORMAL LOW (ref 135–145)

## 2018-04-21 LAB — BLOOD CULTURE ID PANEL (REFLEXED)

## 2018-04-21 LAB — GLUCOSE, CAPILLARY
Glucose-Capillary: 258 mg/dL — ABNORMAL HIGH (ref 70–99)
Glucose-Capillary: 341 mg/dL — ABNORMAL HIGH (ref 70–99)
Glucose-Capillary: 172 mg/dL — ABNORMAL HIGH (ref 70–99)
Glucose-Capillary: 118 mg/dL — ABNORMAL HIGH (ref 70–99)
Glucose-Capillary: 150 mg/dL — ABNORMAL HIGH (ref 70–99)
Glucose-Capillary: 161 mg/dL — ABNORMAL HIGH (ref 70–99)
Glucose-Capillary: 171 mg/dL — ABNORMAL HIGH (ref 70–99)
Glucose-Capillary: 311 mg/dL — ABNORMAL HIGH (ref 70–99)
Glucose-Capillary: 323 mg/dL — ABNORMAL HIGH (ref 70–99)
Glucose-Capillary: 287 mg/dL — ABNORMAL HIGH (ref 70–99)

## 2018-04-21 LAB — KETONES, URINE: Ketones, ur: NEGATIVE mg/dL

## 2018-04-21 LAB — CBC
HCT: 41.2 % (ref 36.0–46.0)
Hemoglobin: 13.4 g/dL (ref 12.0–15.0)
MCH: 24.8 pg — ABNORMAL LOW (ref 26.0–34.0)
MCHC: 32.5 g/dL (ref 30.0–36.0)
MCV: 76.3 fL — ABNORMAL LOW (ref 80.0–100.0)
Platelets: 330 10*3/uL (ref 150–400)
RBC: 5.4 MIL/uL — ABNORMAL HIGH (ref 3.87–5.11)
RDW: 18.5 % — ABNORMAL HIGH (ref 11.5–15.5)
WBC: 20.3 10*3/uL — ABNORMAL HIGH (ref 4.0–10.5)
nRBC: 0 % (ref 0.0–0.2)

## 2018-04-21 LAB — APTT
aPTT: 144 seconds — ABNORMAL HIGH (ref 24–36)
aPTT: 47 s — ABNORMAL HIGH (ref 24–36)

## 2018-04-21 LAB — TROPONIN I
Troponin I: 1.31 ng/mL (ref <0.03)
Troponin I: 2.29 ng/mL (ref <0.03)
Troponin I: 2.82 ng/mL (ref <0.03)
Troponin I: 3.16 ng/mL
Troponin I: 2.04 ng/mL

## 2018-04-21 LAB — PROTIME-INR
INR: >10 (ref 0.8–1.2)
INR: 1.8 — ABNORMAL HIGH (ref 0.8–1.2)
Prothrombin Time: >90 seconds — ABNORMAL HIGH (ref 11.4–15.2)
Prothrombin Time: 20.2 s — ABNORMAL HIGH (ref 11.4–15.2)

## 2018-04-21 LAB — CORTISOL: Cortisol, Plasma: 28.8 ug/dL

## 2018-04-21 MED ORDER — SODIUM CHLORIDE 0.9 % IV SOLN
0.0000 ug/min | INTRAVENOUS | Status: DC
  Administered 2018-04-21: 20 ug/min via INTRAVENOUS
  Filled 2018-04-21: qty 10

## 2018-04-21 MED ORDER — INSULIN GLARGINE 100 UNIT/ML ~~LOC~~ SOLN
46.0000 [IU] | Freq: Every day | SUBCUTANEOUS | Status: DC
  Administered 2018-04-22 – 2018-04-25 (×4): 46 [IU] via SUBCUTANEOUS
  Filled 2018-04-21 (×6): qty 0.46

## 2018-04-21 MED ORDER — CEFAZOLIN SODIUM-DEXTROSE 2-4 GM/100ML-% IV SOLN
2.0000 g | Freq: Three times a day (TID) | INTRAVENOUS | Status: DC
  Administered 2018-04-21 (×2): 2 g via INTRAVENOUS
  Filled 2018-04-21 (×5): qty 100

## 2018-04-21 MED ORDER — SODIUM CHLORIDE 0.9% FLUSH: 10.0000 mL | INTRAVENOUS | Status: DC | PRN

## 2018-04-21 MED ORDER — INSULIN ASPART 100 UNIT/ML ~~LOC~~ SOLN
0.0000 [IU] | SUBCUTANEOUS | Status: DC
  Administered 2018-04-21: 11 [IU] via SUBCUTANEOUS
  Administered 2018-04-21: 7 [IU] via SUBCUTANEOUS
  Administered 2018-04-22: 3 [IU] via SUBCUTANEOUS
  Administered 2018-04-22: 4 [IU] via SUBCUTANEOUS
  Administered 2018-04-22: 3 [IU] via SUBCUTANEOUS
  Filled 2018-04-21 (×6): qty 1

## 2018-04-21 MED ORDER — SODIUM CHLORIDE 0.9% FLUSH
10.0000 mL | Freq: Two times a day (BID) | INTRAVENOUS | Status: DC
  Administered 2018-04-21: 40 mL
  Administered 2018-04-22: 11:00:00 10 mL
  Administered 2018-04-22: 30 mL
  Administered 2018-04-23 – 2018-04-27 (×8): 10 mL

## 2018-04-21 MED ORDER — INSULIN ASPART 100 UNIT/ML ~~LOC~~ SOLN: 0.0000 [IU] | Freq: Every day | SUBCUTANEOUS | Status: DC

## 2018-04-21 MED ORDER — DILTIAZEM HCL 30 MG PO TABS
30.0000 mg | ORAL_TABLET | Freq: Four times a day (QID) | ORAL | Status: DC
  Administered 2018-04-21 (×2): 30 mg via ORAL
  Filled 2018-04-21 (×2): qty 1

## 2018-04-21 MED ORDER — ASPIRIN EC 81 MG PO TBEC
81.0000 mg | DELAYED_RELEASE_TABLET | Freq: Every day | ORAL | Status: DC
  Administered 2018-04-21 – 2018-04-27 (×7): 81 mg via ORAL
  Filled 2018-04-21 (×7): qty 1

## 2018-04-21 MED ORDER — GLUCAGON HCL RDNA (DIAGNOSTIC) 1 MG IJ SOLR
INTRAMUSCULAR | Status: AC
  Administered 2018-04-21: 1 mg
  Filled 2018-04-21: qty 1

## 2018-04-21 MED ORDER — ORAL CARE MOUTH RINSE
15.0000 mL | Freq: Two times a day (BID) | OROMUCOSAL | Status: DC
  Administered 2018-04-22 – 2018-04-25 (×4): 15 mL via OROMUCOSAL

## 2018-04-21 MED ORDER — HEPARIN (PORCINE) 25000 UT/250ML-% IV SOLN
950.0000 [IU]/h | INTRAVENOUS | Status: DC
  Administered 2018-04-21: 750 [IU]/h via INTRAVENOUS
  Filled 2018-04-21: qty 250

## 2018-04-21 MED ORDER — INSULIN ASPART 100 UNIT/ML ~~LOC~~ SOLN
0.0000 [IU] | Freq: Three times a day (TID) | SUBCUTANEOUS | Status: DC
  Administered 2018-04-21 (×2): 11 [IU] via SUBCUTANEOUS
  Administered 2018-04-21: 3 [IU] via SUBCUTANEOUS
  Filled 2018-04-21 (×3): qty 1

## 2018-04-21 MED ORDER — VITAMIN K1 10 MG/ML IJ SOLN
5.0000 mg | Freq: Once | INTRAVENOUS | Status: AC
  Administered 2018-04-21: 5 mg via INTRAVENOUS
  Filled 2018-04-21: qty 0.5

## 2018-04-21 MED ORDER — SODIUM CHLORIDE 0.9 % IV SOLN
2.0000 g | INTRAVENOUS | Status: DC
  Administered 2018-04-21 – 2018-04-26 (×28): 2 g via INTRAVENOUS
  Filled 2018-04-21 (×9): qty 2
  Filled 2018-04-21: qty 2000
  Filled 2018-04-21 (×3): qty 2
  Filled 2018-04-21: qty 2000
  Filled 2018-04-21 (×2): qty 2
  Filled 2018-04-21: qty 2000
  Filled 2018-04-21: qty 2
  Filled 2018-04-21 (×2): qty 2000
  Filled 2018-04-21 (×2): qty 2
  Filled 2018-04-21: qty 2000
  Filled 2018-04-21 (×4): qty 2
  Filled 2018-04-21 (×2): qty 2000
  Filled 2018-04-21 (×2): qty 2
  Filled 2018-04-21: qty 2000

## 2018-04-21 MED ORDER — LACTATED RINGERS IV SOLN: INTRAVENOUS | Status: DC

## 2018-04-21 MED ORDER — METOPROLOL TARTRATE 25 MG PO TABS: 12.5000 mg | ORAL_TABLET | Freq: Two times a day (BID) | ORAL | Status: DC

## 2018-04-21 MED ORDER — INSULIN GLARGINE 100 UNIT/ML ~~LOC~~ SOLN
10.0000 [IU] | Freq: Once | SUBCUTANEOUS | Status: AC
  Administered 2018-04-21: 10 [IU] via SUBCUTANEOUS
  Filled 2018-04-21: qty 0.1

## 2018-04-21 MED ORDER — INSULIN GLARGINE 100 UNIT/ML ~~LOC~~ SOLN
30.0000 [IU] | Freq: Every day | SUBCUTANEOUS | Status: DC
  Administered 2018-04-21: 06:00:00 30 [IU] via SUBCUTANEOUS
  Filled 2018-04-21 (×2): qty 0.3

## 2018-04-21 MED ORDER — METOPROLOL TARTRATE 5 MG/5ML IV SOLN
5.0000 mg | Freq: Once | INTRAVENOUS | Status: AC
  Administered 2018-04-21: 5 mg via INTRAVENOUS
  Filled 2018-04-21: qty 5

## 2018-04-21 NOTE — Consult Note (Addendum)
ANTICOAGULATION CONSULT NOTE - Initial Consult  Pharmacy Consult for Heparin Dosing and Monitoring Indication: chest pain/ACS, Elevated troponin, HX of VTE   Allergies  Allergen Reactions  . Skin Protectants, Misc. Rash  . Flagyl [Metronidazole]   . Gabapentin   . Levetiracetam   . Tape Other (See Comments)    blisters  . Ace Inhibitors Rash  . Ertapenem Rash  . Sertraline Hcl Rash    Patient Measurements: Height: 5\' 3"  (160 cm) Weight: 143 lb 11.8 oz (65.2 kg) IBW/kg (Calculated) : 52.4  Vital Signs: Temp: 98.7 F (37.1 C) (04/02 0400) Temp Source: Oral (04/02 0400) BP: 139/62 (04/02 0400) Pulse Rate: 95 (04/02 0400)  Labs: Recent Labs    04/20/18 1429 04/20/18 1654 04/20/18 1939 04/20/18 2307 04/21/18 0355  HGB 13.0  --   --   --  13.4  HCT 39.8  --   --   --  41.2  PLT 320  --   --   --  330  APTT  --   --   --   --  144*  LABPROT  --  >90.0*  --   --  >90.0*  INR  --  >10.0*  --   --  >10.0*  CREATININE 1.16*  --  1.10* 1.09* 0.75  TROPONINI 0.99*  --  0.57* 1.31* 2.29*    Estimated Creatinine Clearance: 70.4 mL/min (by C-G formula based on SCr of 0.75 mg/dL).   Medical History: Past Medical History:  Diagnosis Date  . Asthma   . CHF (congestive heart failure) (HCC)   . Chronic pain   . COPD (chronic obstructive pulmonary disease) (HCC)   . Diabetes mellitus (HCC)   . Peripheral vascular disease (HCC)   . Stroke (cerebrum) [facility])     Assessment: Pharmacy consulted for heparin dosing and monitoring for ACS/STEMI/Elevated Troponin and PMH of VTE. For a 58 yo female admitted with MSSA bacteremia. Patient was taking warfarin PTA and was >10 INR on admission.   Troponin: 0.57>> 1.31>> 2.29   Goal of Therapy:  Heparin level 0.3-0.7 units/ml aPTT 66-102s seconds Monitor platelets by anticoagulation protocol: Yes   Plan:  4/2 AM: INR >10, aPTT 144.   Vitamin K 5mg  IV x 1 dose ordered.   After discussion with provider- heparin drip will be held  until aPTT <100 sec and INR closer to therapeutic.  INR and aPTT level ordered for 1400 today, approximally 7-8 hours after Vitamin K infusion.   Gardner Candle, PharmD, BCPS Clinical Pharmacist 04/21/2018 6:47 AM

## 2018-04-21 NOTE — Progress Notes (Signed)
Peripherally Inserted Central Catheter/Midline Placement  The IV Nurse has discussed with the patient and/or persons authorized to consent for the patient, the purpose of this procedure and the potential benefits and risks involved with this procedure.  The benefits include less needle sticks, lab draws from the catheter, and the patient may be discharged home with the catheter. Risks include, but not limited to, infection, bleeding, blood clot (thrombus formation), and puncture of an artery; nerve damage and irregular heartbeat and possibility to perform a PICC exchange if needed/ordered by physician.  Alternatives to this procedure were also discussed.  Bard Power PICC patient education guide, fact sheet on infection prevention and patient information card has been provided to patient /or left at bedside.    PICC/Midline Placement Documentation  PICC Triple Lumen 04/21/18 PICC Left Brachial 42 cm 0 cm (Active)  Indication for Insertion or Continuance of Line Poor Vasculature-patient has had multiple peripheral attempts or PIVs lasting less than 24 hours 04/21/2018 11:02 PM  Exposed Catheter (cm) 0 cm 04/21/2018 11:02 PM  Site Assessment Clean;Dry;Intact 04/21/2018 11:02 PM  Lumen #1 Status Blood return noted;Flushed;Saline locked 04/21/2018 11:02 PM  Lumen #2 Status Blood return noted;Flushed;Saline locked 04/21/2018 11:02 PM  Lumen #3 Status Blood return noted;Flushed;Saline locked 04/21/2018 11:02 PM  Dressing Type Transparent;Occlusive;Securing device 04/21/2018 11:02 PM  Dressing Status Clean;Dry;Intact;Antimicrobial disc in place 04/21/2018 11:02 PM  Line Adjustment (NICU/IV Team Only) No 04/21/2018 11:02 PM  Dressing Intervention New dressing 04/21/2018 11:02 PM  Dressing Change Due 04/28/18 04/21/2018 11:02 PM       Netta Corrigan L 04/21/2018, 11:34 PM

## 2018-04-21 NOTE — Progress Notes (Signed)
CRITICAL VALUE ALERT  Critical Value:  INR of >10 Date & Time Notied: 04/20/2018 19:28  Provider Notified: Leanord Asal, NP  Orders Received/Actions taken: hold coumadin

## 2018-04-21 NOTE — Consult Note (Signed)
ANTICOAGULATION CONSULT NOTE - Initial Consult  Pharmacy Consult for Heparin Dosing and Monitoring Indication: chest pain/ACS, Elevated troponin, HX of VTE   Allergies  Allergen Reactions  . Skin Protectants, Misc. Rash  . Flagyl [Metronidazole]   . Gabapentin   . Levetiracetam   . Tape Other (See Comments)    blisters  . Ace Inhibitors Rash  . Ertapenem Rash  . Sertraline Hcl Rash    Patient Measurements: Height: 5\' 3"  (160 cm) Weight: 143 lb 11.8 oz (65.2 kg) IBW/kg (Calculated) : 52.4  Vital Signs: Temp: 97.9 F (36.6 C) (04/02 1500) Temp Source: Oral (04/02 1500) BP: 116/62 (04/02 1500) Pulse Rate: 65 (04/02 1500)  Labs: Recent Labs    04/20/18 1429 04/20/18 1654  04/21/18 0355 04/21/18 0838 04/21/18 1200 04/21/18 1534  HGB 13.0  --   --  13.4  --   --   --   HCT 39.8  --   --  41.2  --   --   --   PLT 320  --   --  330  --   --   --   APTT  --   --   --  144*  --   --  47*  LABPROT  --  >90.0*  --  >90.0*  --   --  20.2*  INR  --  >10.0*  --  >10.0*  --   --  1.8*  CREATININE 1.16*  --    < > 0.75 0.76 0.85 0.72  TROPONINI 0.99*  --    < > 2.29* 2.82* 3.16* 2.04*   < > = values in this interval not displayed.    Estimated Creatinine Clearance: 70.4 mL/min (by C-G formula based on SCr of 0.72 mg/dL).   Medical History: Past Medical History:  Diagnosis Date  . Asthma   . CHF (congestive heart failure) (HCC)   . Chronic pain   . COPD (chronic obstructive pulmonary disease) (HCC)   . Diabetes mellitus (HCC)   . Peripheral vascular disease (HCC)   . Stroke (cerebrum) [facility])     Assessment: Pharmacy consulted for heparin dosing and monitoring for ACS/STEMI/Elevated Troponin and PMH of VTE. For a 58 yo female admitted with MSSA bacteremia. Patient was taking warfarin PTA and was >10 INR on admission.   Goal of Therapy:  Heparin level 0.3-0.7 units/ml aPTT 66-102s seconds Monitor platelets by anticoagulation protocol: Yes   Plan:  APTT is 47 and  INR is 1.8. Will initiate heparin 750 units/hr with no bolus. Will obtain initial anti-Xa level at 0200.   Pharmacy will continue to monitor and adjust per consult.   Jazia Faraci L 04/21/2018 5:43 PM

## 2018-04-21 NOTE — Progress Notes (Signed)
At around 1700, Patient went into a fib RVR with HR in 140s. ICU MD notified. Received new order to give a one time dose of metoprolol. At around 1800, HR dropped to 50s, 60s and BP dropped to 80 systolically. ICU MD aware. Received new orders. Gave report to nightshift RN. HR and BP went up after a bolus of LR. Heparin drip started.

## 2018-04-21 NOTE — Consult Note (Signed)
Pharmacy Antibiotic Note  Alexandra Goodman is a 58 y.o. female admitted on 04/20/2018 with sepsis and suspected OM/Discitis.  Pharmacy has initially consulted for Zosyn/Vancomycin dosing. BCID now showing Staphylococcus aureus, MecA negative (MSSA). Pharmacy now consulted for Cefazolin dosing.     Plan: Start Cefazolin 2g IV every 8 hours   Height: 5\' 3"  (160 cm) Weight: 143 lb 11.8 oz (65.2 kg) IBW/kg (Calculated) : 52.4  Scr. 1.16 (1.00 baseline on 05/05/2016)  Temp (24hrs), Avg:98.5 F (36.9 C), Min:98.3 F (36.8 C), Max:98.7 F (37.1 C)  Recent Labs  Lab 04/20/18 1429 04/20/18 1825 04/20/18 1939 04/20/18 2307  WBC 22.0*  --   --   --   CREATININE 1.16*  --  1.10* 1.09*  LATICACIDVEN 2.0* 2.9*  --   --     Estimated Creatinine Clearance: 51.7 mL/min (A) (by C-G formula based on SCr of 1.09 mg/dL (H)).    Allergies  Allergen Reactions  . Skin Protectants, Misc. Rash  . Flagyl [Metronidazole]   . Gabapentin   . Levetiracetam   . Tape Other (See Comments)    blisters  . Ace Inhibitors Rash  . Ertapenem Rash  . Sertraline Hcl Rash    Antimicrobials this admission: Vancomycin 04/01 >> 4/2 Zosyn 04/01 >> 4/2 Cefazolin 04/02>>    Microbiology results: BCx 4/1: Staph aureus MecA negative)  UCx 4/1: pending  COVID 4/1: pending   Thank you for allowing pharmacy to be a part of this patient's care.  Gardner Candle, PharmD, BCPS Clinical Pharmacist 04/21/2018 2:54 AM

## 2018-04-21 NOTE — Progress Notes (Signed)
PHARMACY - PHYSICIAN COMMUNICATION CRITICAL VALUE ALERT - BLOOD CULTURE IDENTIFICATION (BCID)  Alexandra Goodman is an 58 y.o. female who presented to [facility] on 04/20/2018 with sepsis and suspected osteomyelitis/discitis.   Assessment:  BCID showing Staph aureus, MecA not detected- MSSA  Name of physician (or Provider) Contacted: Harlon Ditty, NP  Current antibiotics: Vancomycin and Zosyn   Changes to prescribed antibiotics recommended:  Will start Cefazolin  Results for orders placed or performed during the hospital encounter of 04/20/18  Blood Culture ID Panel (Reflexed) (Collected: 04/20/2018  2:46 PM)  Result Value Ref Range   Enterococcus species NOT DETECTED NOT DETECTED   Listeria monocytogenes NOT DETECTED NOT DETECTED   Staphylococcus species DETECTED (A) NOT DETECTED   Staphylococcus aureus (BCID) DETECTED (A) NOT DETECTED   Methicillin resistance NOT DETECTED NOT DETECTED   Streptococcus species NOT DETECTED NOT DETECTED   Streptococcus agalactiae NOT DETECTED NOT DETECTED   Streptococcus pneumoniae NOT DETECTED NOT DETECTED   Streptococcus pyogenes NOT DETECTED NOT DETECTED   Acinetobacter baumannii NOT DETECTED NOT DETECTED   Enterobacteriaceae species NOT DETECTED NOT DETECTED   Enterobacter cloacae complex NOT DETECTED NOT DETECTED   Escherichia coli NOT DETECTED NOT DETECTED   Klebsiella oxytoca NOT DETECTED NOT DETECTED   Klebsiella pneumoniae NOT DETECTED NOT DETECTED   Proteus species NOT DETECTED NOT DETECTED   Serratia marcescens NOT DETECTED NOT DETECTED   Haemophilus influenzae NOT DETECTED NOT DETECTED   Neisseria meningitidis NOT DETECTED NOT DETECTED   Pseudomonas aeruginosa NOT DETECTED NOT DETECTED   Candida albicans NOT DETECTED NOT DETECTED   Candida glabrata NOT DETECTED NOT DETECTED   Candida krusei NOT DETECTED NOT DETECTED   Candida parapsilosis NOT DETECTED NOT DETECTED   Candida tropicalis NOT DETECTED NOT DETECTED    Gardner Candle,  PharmD, BCPS Clinical Pharmacist 04/21/2018 2:47 AM

## 2018-04-21 NOTE — Consult Note (Addendum)
NAME: Alexandra Goodman  DOB: March 01, 1960  MRN: [id number]  Date/Time: 04/21/2018 4:21 PM  REQUESTING PROVIDER:sainani Subjective:  REASON FOR CONSULT: Staph aureus bacteremia ?pt is a poor historian , chart reviewed including care everywhere ADALEYA Goodman is a 58 y.o. female with a history of COPD, DM, neuropathy,  Presented with left lower back pain radiating to the abdomen and groin Went to her PCP on 3/27/for   above complaint and a urine was sent for possible UTI as she has had renal stomes and UTI before, and it came back as MSSA and she was asked to go tot he ED  In the ED she was hypoxic , had leucocytosis, hyperglycemia She had Ct abdomen renal protocol which revealed discitis of L1-L2 area and was started on broad spectrum antibiotics Vanco/zosyn and was sent to ICU. covid test was done. 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 Marital status: Single    Spouse name: Not on file  . Number of children: Not on file  . Years of education: Not on file  . Highest education level: Not on file  Occupational History  . Not on file  Social Needs  . Financial resource strain: Not on file  . Food insecurity:    Worry: Not on file    Inability: Not on file  . Transportation needs:    Medical: Not on file    Non-medical: Not on file  Tobacco Use  . Smoking status: Current Every Day Smoker    Packs/day: 2.00    Years: 45.00    Pack years: 90.00    Types: Cigarettes  . Smokeless tobacco:  Never Used  Substance and Sexual Activity  . Alcohol use: Not Currently  . Drug use: Never  . Sexual activity: Not on file  Lifestyle  . Physical activity:    Days per week: Not on file    Minutes per session: Not on file  . Stress: Not on file  Relationships  . Social connections:    Talks on phone: Not on file    Gets together: Not on file    Attends religious service: Not on file    Active member of club or organization: Not on file    Attends meetings of clubs or organizations: Not on file    Relationship status: Not on file  . Intimate partner violence:    Fear of current or ex partner: Not on file    Emotionally abused: Not on file    Physically abused: Not on file    Forced sexual activity: Not on file  Other Topics Concern  . Not on file  Social History Narrative  . Not on file    Family History  Problem Relation Age of Onset  . Heart disease Mother   . Heart disease Father    Allergies  Allergen Reactions  . Skin Protectants, Misc. Rash  . Flagyl [Metronidazole]   . Gabapentin   . Levetiracetam   .  Tape Other (See Comments)    blisters  . Ace Inhibitors Rash  . Ertapenem Rash  . Sertraline Hcl Rash   ID  Recent  Procedure Surgery Injections Trauma Sick contacts Travel Antibiotic use Food- raw/exotic Steroid/immune suppressants/splenectomy/Hardware Animal bites Tick exposure Water sports Fishing/hunting/animal bird exposure ? Current Facility-Administered Medications  Medication Dose Route Frequency Provider Last Rate Last Dose  . acetaminophen (TYLENOL) tablet 650 mg  650 mg Oral Q6H PRN Houston Siren, MD       Or  . acetaminophen (TYLENOL) suppository 650 mg  650 mg Rectal Q6H PRN Sainani, Rolly Pancake, MD      . albuterol (PROVENTIL HFA;VENTOLIN HFA) 108 (90 Base) MCG/ACT inhaler 2 puff  2 puff Inhalation Q4H PRN Houston Siren, MD      . atorvastatin (LIPITOR) tablet 80 mg  80 mg Oral Q2000 Houston Siren, MD   80 mg at 04/20/18 2038   . ceFAZolin (ANCEF) IVPB 2g/100 mL premix  2 g Intravenous Q8H Gardner Candle, RPH   Stopped at 04/21/18 1610  . clopidogrel (PLAVIX) tablet 75 mg  75 mg Oral Daily Houston Siren, MD   75 mg at 04/21/18 0809  . dextrose 5 % in lactated ringers infusion   Intravenous Continuous Judithe Modest, NP   Stopped at 04/21/18 [phone]  . diltiazem (CARDIZEM) tablet 30 mg  30 mg Oral QID Aleskerov, Fuad, MD   30 mg at 04/21/18 1535  . famotidine (PEPCID) tablet 40 mg  40 mg Oral QPM Houston Siren, MD   40 mg at 04/20/18 2040  . FLUoxetine (PROZAC) capsule 20 mg  20 mg Oral Daily Houston Siren, MD   20 mg at 04/21/18 0810  . insulin aspart (novoLOG) injection 0-15 Units  0-15 Units Subcutaneous TID WC Judithe Modest, NP   11 Units at 04/21/18 1213  . insulin aspart (novoLOG) injection 0-5 Units  0-5 Units Subcutaneous QHS Harlon Ditty D, NP      . insulin glargine (LANTUS) injection 30 Units  30 Units Subcutaneous Daily Judithe Modest, NP   30 Units at 04/21/18 0557  . insulin regular, human (MYXREDLIN) 100 units/ 100 mL infusion   Intravenous Continuous Harlon Ditty D, NP 4 mL/hr at 04/21/18 5409 4 Units/hr at 04/21/18 8119  . lactated ringers infusion   Intravenous Continuous Judithe Modest, NP   Stopped at 04/21/18 [phone]  . levothyroxine (SYNTHROID, LEVOTHROID) tablet 88 mcg  88 mcg Oral Q0600 Houston Siren, MD   88 mcg at 04/21/18 0809  . MEDLINE mouth rinse  15 mL Mouth Rinse BID Aleskerov, Fuad, MD      . metoprolol succinate (TOPROL-XL) 24 hr tablet 100 mg  100 mg Oral Daily Houston Siren, MD   100 mg at 04/21/18 0808  . mirabegron ER (MYRBETRIQ) tablet 25 mg  25 mg Oral Daily Houston Siren, MD   25 mg at 04/21/18 0809  . mometasone-formoterol (DULERA) 200-5 MCG/ACT inhaler 2 puff  2 puff Inhalation BID Houston Siren, MD   2 puff at 04/21/18 [phone]  . montelukast (SINGULAIR) tablet 10 mg  10 mg Oral Daily Houston Siren, MD   10 mg at 04/21/18 0809  . morphine (MS  CONTIN) 12 hr tablet 30 mg  30 mg Oral Q12H Houston Siren, MD   30 mg at 04/21/18 0809  . ondansetron (ZOFRAN) tablet 4 mg  4 mg Oral Q6H PRN Houston Siren, MD  Or  . ondansetron (ZOFRAN) injection 4 mg  4 mg Intravenous Q6H PRN Sainani, Rolly Pancake, MD      . oxyCODONE-acetaminophen (PERCOCET/ROXICET) 5-325 MG per tablet 1 tablet  1 tablet Oral 5 X Daily Houston Siren, MD   1 tablet at 04/21/18 1535   And  . oxyCODONE (Oxy IR/ROXICODONE) immediate release tablet 5 mg  5 mg Oral 5 X Daily Houston Siren, MD   5 mg at 04/21/18 1535  . tiotropium (SPIRIVA) inhalation capsule ([facility] use ONLY) 18 mcg  1 capsule Inhalation Daily Houston Siren, MD   18 mcg at 04/21/18 0810  . traZODone (DESYREL) tablet 150 mg  150 mg Oral QHS Houston Siren, MD   150 mg at 04/20/18 2256  . Warfarin - Pharmacist Dosing Inpatient   Does not apply q1800 Houston Siren, MD         Abtx:  Anti-infectives (From admission, onward)   Start     Dose/Rate Route Frequency Ordered Stop   04/21/18 2000  vancomycin (VANCOCIN) IVPB 1000 mg/200 mL premix  Status:  Discontinued     1,000 mg 200 mL/hr over 60 Minutes Intravenous Every 24 hours 04/20/18 1646 04/21/18 0249   04/21/18 0600  ceFAZolin (ANCEF) IVPB 2g/100 mL premix     2 g 200 mL/hr over 30 Minutes Intravenous Every 8 hours 04/21/18 0251     04/20/18 2300  piperacillin-tazobactam (ZOSYN) IVPB 3.375 g  Status:  Discontinued     3.375 g 12.5 mL/hr over 240 Minutes Intravenous Every 8 hours 04/20/18 1646 04/21/18 0249   04/20/18 2000  vancomycin (VANCOCIN) IVPB 750 mg/150 ml premix     750 mg 150 mL/hr over 60 Minutes Intravenous  Once 04/20/18 1646 04/20/18 2255   04/20/18 1500  vancomycin (VANCOCIN) IVPB 1000 mg/200 mL premix     1,000 mg 200 mL/hr over 60 Minutes Intravenous  Once 04/20/18 1457 04/20/18 1627   04/20/18 1500  piperacillin-tazobactam (ZOSYN) IVPB 3.375 g     3.375 g 100 mL/hr over 30 Minutes Intravenous  Once 04/20/18 1457  04/20/18 1559      REVIEW OF SYSTEMS:  NA as she is confused and slow to respond Objective:  VITALS:  BP 116/62 (BP Location: Left Arm)   Pulse 65   Temp 97.9 F (36.6 C) (Oral)   Resp (!) 21   Ht  (1.6 m)   Wt 65.2 kg   SpO2 91%   BMI 25.46 kg/m  PHYSICAL EXAM:  General: Awake  cooperative, no distress, a little confused Head: Normocephalic, without obvious abnormality, atraumatic. Eyes: Conjunctivae clear, anicteric sclerae. Pupils are equal ENT Nares normal. No drainage or sinus tenderness. Lips, mucosa, and tongue normal. No Thrush Neck: Supple, symmetrical, no adenopathy, thyroid: non tender no carotid bruit and no JVD. Back: not examined Lungs: b/l air entry Heart: irregular RVR Abdomen: Soft, Extremities: left BKA Rt great toe- ulcer on the plantar surface Healed wound on the area of achilles tendon rt leg- scab present Lymph: Cervical, supraclavicular normal. Neurologic: Grossly non-focal Pertinent Labs Lab Results CBC    Component Value Date/Time   WBC 20.3 (H) 04/21/2018 0355   RBC 5.40 (H) 04/21/2018 0355   HGB 13.4 04/21/2018 0355   HGB 10.1 (L) 01/15/2013 0640   HCT 41.2 04/21/2018 0355   HCT 30.9 (L) 01/15/2013 0640   PLT 330 04/21/2018 0355   PLT 437 01/15/2013 0640   MCV 76.3 (L) 04/21/2018 0355   MCV 77 (L)  01/15/2013 0640   MCH 24.8 (L) 04/21/2018 0355   MCHC 32.5 04/21/2018 0355   RDW 18.5 (H) 04/21/2018 0355   RDW 19.0 (H) 01/15/2013 0640   LYMPHSABS 0.7 04/20/2018 1429   LYMPHSABS 1.1 01/15/2013 0640   MONOABS 0.8 04/20/2018 1429   MONOABS 0.3 01/15/2013 0640   EOSABS 0.2 04/20/2018 1429   EOSABS 0.0 01/15/2013 0640   BASOSABS 0.1 04/20/2018 1429   BASOSABS 0.0 01/15/2013 0640    CMP Latest Ref Rng & Units 04/21/2018 04/21/2018 04/21/2018  Glucose 70 - 99 mg/dL 481(Y) 590(B) 311(E)  BUN 6 - 20 mg/dL 16(K) 44(C) 95(Q)  Creatinine 0.44 - 1.00 mg/dL 7.22 5.75 0.51  Sodium 135 - 145 mmol/L 131(L) 131(L) 131(L)  Potassium 3.5 - 5.1  mmol/L 3.7 3.9 4.1  Chloride 98 - 111 mmol/L 94(L) 93(L) 95(L)  CO2 22 - 32 mmol/L 27 25 26   Calcium 8.9 - 10.3 mg/dL 9.0 9.0 9.0  Total Protein 6.5 - 8.1 g/dL - - -  Total Bilirubin 0.3 - 1.2 mg/dL - - -  Alkaline Phos 38 - 126 U/L - - -  AST 15 - 41 U/L - - -  ALT 0 - 44 U/L - - -      Microbiology: Recent Results (from the past 240 hour(s))  Blood Culture (routine x 2)     Status: None (Preliminary result)   Collection Time: 04/20/18  2:46 PM  Result Value Ref Range Status   Specimen Description BLOOD BLOOD RIGHT FOREARM  Final   Special Requests   Final    BOTTLES DRAWN AEROBIC AND ANAEROBIC Blood Culture results may not be optimal due to an excessive volume of blood received in culture bottles   Culture  Setup Time   Final    Organism ID to follow GRAM POSITIVE COCCI IN BOTH AEROBIC AND ANAEROBIC BOTTLES CRITICAL RESULT CALLED TO, READ BACK BY AND VERIFIED WITH: [facility] HALLAJI 04/21/2018 222 REC Performed at [facility], [address] [place], [place] [address]    Culture GRAM POSITIVE COCCI  Final   Report Status PENDING  Incomplete  Blood Culture (routine x 2)     Status: None (Preliminary result)   Collection Time: 04/20/18  2:46 PM  Result Value Ref Range Status   Specimen Description BLOOD LEFT ANTECUBITAL  Final   Special Requests   Final    BOTTLES DRAWN AEROBIC AND ANAEROBIC Blood Culture adequate volume   Culture  Setup Time   Final    GRAM POSITIVE COCCI IN BOTH AEROBIC AND ANAEROBIC BOTTLES CRITICAL VALUE NOTED.  VALUE IS CONSISTENT WITH PREVIOUSLY REPORTED AND CALLED VALUE. Performed at [facility], [address] [place], [place] [address]    Culture [facility] POSITIVE COCCI  Final   Report Status PENDING  Incomplete  Urine culture     Status: Abnormal (Preliminary result)   Collection Time: 04/20/18  2:46 PM  Result Value Ref Range Status   Specimen Description   Final    URINE, RANDOM Performed at [facility], [address], [place], [place] [address]    Special Requests   Final    NONE Performed at [facility], [address], [place], [place] [address]    Culture (A)  Final    >=100,000 COLONIES/mL STAPHYLOCOCCUS AUREUS SUSCEPTIBILITIES TO FOLLOW Performed at [facility], [address] [place], [place] [address]    Report Status PENDING  Incomplete  Blood Culture ID Panel (Reflexed)  Status: Abnormal   Collection Time: 04/20/18  2:46 PM  Result Value Ref Range Status   Enterococcus species NOT DETECTED NOT DETECTED Final   Listeria monocytogenes NOT DETECTED NOT DETECTED Final   Staphylococcus species DETECTED (A) NOT DETECTED Final    Comment: CRITICAL RESULT CALLED TO, READ BACK BY AND VERIFIED WITH: SHEEMA HALLAJI 04/21/2018 222 REC    Staphylococcus aureus (BCID) DETECTED (A) NOT DETECTED Final    Comment: Methicillin (oxacillin) susceptible Staphylococcus aureus (MSSA). Preferred therapy is anti staphylococcal beta lactam antibiotic (Cefazolin or Nafcillin), unless clinically contraindicated. CRITICAL RESULT CALLED TO, READ BACK BY AND VERIFIED WITH: SHEEMA HALLAJI 04/21/2018 222 REC    Methicillin resistance NOT DETECTED NOT DETECTED Final   Streptococcus species NOT DETECTED NOT DETECTED Final   Streptococcus agalactiae NOT DETECTED NOT DETECTED Final   Streptococcus pneumoniae NOT DETECTED NOT DETECTED Final   Streptococcus pyogenes NOT DETECTED NOT DETECTED Final   Acinetobacter baumannii NOT DETECTED NOT DETECTED Final   Enterobacteriaceae species NOT DETECTED NOT DETECTED Final   Enterobacter cloacae complex NOT DETECTED NOT DETECTED Final   Escherichia coli NOT DETECTED NOT DETECTED Final   Klebsiella oxytoca NOT DETECTED NOT DETECTED Final   Klebsiella pneumoniae NOT DETECTED NOT DETECTED Final   Proteus species NOT DETECTED NOT DETECTED Final   Serratia marcescens NOT DETECTED NOT DETECTED Final   Haemophilus influenzae NOT DETECTED NOT DETECTED Final    Neisseria meningitidis NOT DETECTED NOT DETECTED Final   Pseudomonas aeruginosa NOT DETECTED NOT DETECTED Final   Candida albicans NOT DETECTED NOT DETECTED Final   Candida glabrata NOT DETECTED NOT DETECTED Final   Candida krusei NOT DETECTED NOT DETECTED Final   Candida parapsilosis NOT DETECTED NOT DETECTED Final   Candida tropicalis NOT DETECTED NOT DETECTED Final    Comment: Performed at [facility], [address] [place], [place] [address]  MRSA PCR Screening     Status: None   Collection Time: 04/20/18  7:04 PM  Result Value Ref Range Status   MRSA by PCR NEGATIVE NEGATIVE Final    Comment:        The GeneXpert MRSA Assay (FDA approved for NASAL specimens only), is one component of a comprehensive MRSA colonization surveillance program. It is not intended to diagnose MRSA infection nor to guide or monitor treatment for MRSA infections. Performed at [facility], [address] [place], [place] [address]     IMAGING RESULTS:   Soft tissue changes of the left diaphragmatic cruz with small gas locules concerning for infection with gas-forming organism. This is in continuity with the anterior aspect of the L1-L2 disc space,   I have personally reviewed the films ? Impression/Recommendation ? ?MSSA bacteremia with L1-L2 discitis Likely source for MSSA is the rt great toe ulcer Will do nafcillin 2 grams IV q 4 because of increased bioburden and discitis-  Being tested for COVID- not high risk for getting COVID- because of O2 needs on airborne  COPD exacerbation with hypoxia  DM- management as per primary team Afib RVR intermittent    ? ___________________________________________________ Discussed with patient and intensivist and her nurse

## 2018-04-21 NOTE — Progress Notes (Signed)
Pt is in ICU with sepsis, bacteremia, suspected discitis. Suspected COVID. As pt is in ICU will not see pt currently as per hospital policy reguarding suspect COVID 19.

## 2018-04-21 NOTE — Progress Notes (Signed)
Pt's troponin went from .57 to 1.31. Pt is not complaining of any chest pain.  No new orders at this time.

## 2018-04-21 NOTE — Progress Notes (Addendum)
CRITICAL CARE NOTE        SUBJECTIVE FINDINGS & SIGNIFICANT EVENTS   Clinically improved.  Discussed case with cardio - Dr Clayborn Bigness - holding TTE, AFrVR resolved now in SR Off cardizem drip, off insulin drip.   Serial troponin increasing - notified cardiology - will initiate modified ACS protocol while awaiting official recommendations.    PAST MEDICAL HISTORY   Past Medical History:  Diagnosis Date   Asthma    CHF (congestive heart failure) (HCC)    Chronic pain    COPD (chronic obstructive pulmonary disease) ([place])    Diabetes mellitus ([place])    Peripheral vascular disease ([place])    Stroke (cerebrum) ([place])      SURGICAL HISTORY   Past Surgical History:  Procedure Laterality Date   below the knee LLE amputation Left      FAMILY HISTORY   Family History  Problem Relation Age of Onset   Heart disease Mother    Heart disease Father      SOCIAL HISTORY   Social History   Tobacco Use   Smoking status: Current Every Day Smoker    Packs/day: 2.00    Years: 45.00    Pack years: 90.00    Types: Cigarettes   Smokeless tobacco: Never Used  Substance Use Topics   Alcohol use: Not Currently   Drug use: Never     MEDICATIONS   Current Medication:  Current Facility-Administered Medications:    acetaminophen (TYLENOL) tablet 650 mg, 650 mg, Oral, Q6H PRN **OR** acetaminophen (TYLENOL) suppository 650 mg, 650 mg, Rectal, Q6H PRN, Sainani, Belia Heman, MD   albuterol (PROVENTIL HFA;VENTOLIN HFA) 108 (90 Base) MCG/ACT inhaler 2 puff, 2 puff, Inhalation, Q4H PRN, Sainani, Belia Heman, MD   amLODipine (NORVASC) tablet 5 mg, 5 mg, Oral, Daily, Sainani, Vivek J, MD, 5 mg at 04/21/18 0814   atorvastatin (LIPITOR) tablet 80 mg, 80 mg, Oral, Q2000, Henreitta Leber, MD, 80 mg at 04/20/18  2038   ceFAZolin (ANCEF) IVPB 2g/100 mL premix, 2 g, Intravenous, Q8H, Hallaji, Sheema M, RPH, Last Rate: 200 mL/hr at 04/21/18 0617, 2 g at 04/21/18 0617   clopidogrel (PLAVIX) tablet 75 mg, 75 mg, Oral, Daily, Sainani, Vivek J, MD, 75 mg at 04/21/18 0809   dextrose 5 % in lactated ringers infusion, , Intravenous, Continuous, Darel Hong D, NP, Last Rate: 75 mL/hr at 04/21/18 0500   digoxin (LANOXIN) 0.25 MG/ML injection 0.125 mg, 0.125 mg, Intravenous, Q6H, Darel Hong D, NP, Stopped at 04/21/18 0017   digoxin (LANOXIN) 0.25 MG/ML injection 0.5 mg, 0.5 mg, Intravenous, Once, Darel Hong D, NP   diltiazem (CARDIZEM) 100 mg in dextrose 5 % 100 mL (1 mg/mL) infusion, 5-15 mg/hr, Intravenous, Titrated, Sainani, Vivek J, MD, Last Rate: 5 mL/hr at 04/21/18 0500, 5 mg/hr at 04/21/18 0500   famotidine (PEPCID) tablet 40 mg, 40 mg, Oral, QPM, Sainani, Vivek J, MD, 40 mg at 04/20/18 2040   FLUoxetine (PROZAC) capsule 20 mg, 20 mg, Oral, Daily, Sainani, Vivek J, MD, 20 mg at 04/21/18 0810   insulin aspart (novoLOG) injection 0-15 Units, 0-15 Units, Subcutaneous, TID WC, Darel Hong D, NP, 3 Units at 04/21/18 0815   insulin aspart (novoLOG) injection 0-5 Units, 0-5 Units, Subcutaneous, QHS, Darel Hong D, NP   insulin glargine (LANTUS) injection 30 Units, 30 Units, Subcutaneous, Daily, Darel Hong D, NP, 30 Units at 04/21/18 0557   insulin regular, human (MYXREDLIN) 100 units/ 100 mL infusion, , Intravenous, Continuous, Bradly Bienenstock, NP,  Last Rate: 4 mL/hr at 04/21/18 0638, 4 Units/hr at 04/21/18 1610   lactated ringers infusion, , Intravenous, Continuous, Bradly Bienenstock, NP, Last Rate: 50 mL/hr at 04/21/18 0913   levothyroxine (SYNTHROID, LEVOTHROID) tablet 88 mcg, 88 mcg, Oral, Q0600, Henreitta Leber, MD, 88 mcg at 04/21/18 0809   metoprolol succinate (TOPROL-XL) 24 hr tablet 100 mg, 100 mg, Oral, Daily, Verdell Carmine, Vivek J, MD, 100 mg at 04/21/18 9604    mirabegron ER (MYRBETRIQ) tablet 25 mg, 25 mg, Oral, Daily, Sainani, Vivek J, MD, 25 mg at 04/21/18 0809   mometasone-formoterol (DULERA) 200-5 MCG/ACT inhaler 2 puff, 2 puff, Inhalation, BID, Henreitta Leber, MD, 2 puff at 04/21/18 0811   montelukast (SINGULAIR) tablet 10 mg, 10 mg, Oral, Daily, Henreitta Leber, MD, 10 mg at 04/21/18 0809   morphine (MS CONTIN) 12 hr tablet 30 mg, 30 mg, Oral, Q12H, Sainani, Belia Heman, MD, 30 mg at 04/21/18 0809   ondansetron (ZOFRAN) tablet 4 mg, 4 mg, Oral, Q6H PRN **OR** ondansetron (ZOFRAN) injection 4 mg, 4 mg, Intravenous, Q6H PRN, Verdell Carmine, Belia Heman, MD   oxyCODONE-acetaminophen (PERCOCET/ROXICET) 5-325 MG per tablet 1 tablet, 1 tablet, Oral, 5 X Daily, 1 tablet at 04/21/18 0553 **AND** oxyCODONE (Oxy IR/ROXICODONE) immediate release tablet 5 mg, 5 mg, Oral, 5 X Daily, Sainani, Vivek J, MD, 5 mg at 04/21/18 0553   phytonadione (VITAMIN K) 5 mg in dextrose 5 % 50 mL IVPB, 5 mg, Intravenous, Once, Darel Hong D, NP, Last Rate: 50 mL/hr at 04/21/18 0916, 5 mg at 04/21/18 0916   tiotropium (SPIRIVA) inhalation capsule ([facility] use ONLY) 18 mcg, 1 capsule, Inhalation, Daily, Henreitta Leber, MD, 18 mcg at 04/21/18 0810   traZODone (DESYREL) tablet 150 mg, 150 mg, Oral, QHS, Sainani, Belia Heman, MD, 150 mg at 04/20/18 2256   Warfarin - Pharmacist Dosing Inpatient, , Does not apply, q1800, Henreitta Leber, MD    ALLERGIES   Skin protectants, misc.; Flagyl [metronidazole]; Gabapentin; Levetiracetam; Tape; Ace inhibitors; Ertapenem; and Sertraline hcl    REVIEW OF SYSTEMS     10 system ROS neg except as per subj findings  PHYSICAL EXAMINATION   Vitals:   04/21/18 0757 04/21/18 0800  BP: 122/61 (!) 121/50  Pulse: 95 95  Resp:  14  Temp:  98.5 F (36.9 C)  SpO2:  91%    GENERAL:NAD  HEAD: Normocephalic, atraumatic.  EYES: Pupils equal, round, reactive to light.  No scleral icterus.  MOUTH: Moist mucosal membrane. NECK: Supple. No  thyromegaly. No nodules. No JVD.  PULMONARY: CTAB CARDIOVASCULAR: S1 and S2. Regular rate and rhythm. No murmurs, rubs, or gallops.  GASTROINTESTINAL: Soft, nontender, non-distended. No masses. Positive bowel sounds.  MUSCULOSKELETAL: No swelling, clubbing, or edema.  NEUROLOGIC: Mild distress due to acute illness SKIN:intact,warm,dry  COVID-19 DISASTER DECLARATION:   FULL CONTACT PHYSICAL EXAMINATION WAS NOT POSSIBLE DUE TO TREATMENT OF COVID-19  AND CONSERVATION OF PERSONAL PROTECTIVE EQUIPMENT, LIMITED EXAM FINDINGS INCLUDE-    Patient assessed or the symptoms described in the history of present illness.  In the context of the Global COVID-19 pandemic, which necessitated consideration that the patient might be at risk for infection with the SARS-CoV-2 virus that causes COVID-19, Institutional protocols and algorithms that pertain to the evaluation of patients at risk for COVID-19 are in a state of rapid change based on information released by regulatory bodies including the CDC and federal and state organizations. These policies and algorithms were followed during the patient's care while in  hospital.    LABS AND IMAGING     LAB RESULTS: Recent Labs  Lab 04/20/18 2307 04/21/18 0355 04/21/18 0838  NA 130* 132* 131*  K 5.2* 3.7 4.1  CL 92* 97* 95*  CO2 '27 26 26  '$ BUN 53* 45* 43*  CREATININE 1.09* 0.75 0.76  GLUCOSE 344* 119* 194*   Recent Labs  Lab 04/20/18 1429 04/21/18 0355  HGB 13.0 13.4  HCT 39.8 41.2  WBC 22.0* 20.3*  PLT 320 330     IMAGING RESULTS: Dg Chest Port 1 View  Result Date: 04/20/2018 CLINICAL DATA:  Fever, multiple complaint EXAM: PORTABLE CHEST 1 VIEW COMPARISON:  01/14/2013 FINDINGS: Mild bilateral interstitial thickening. Hazy left lower lobe airspace disease. Small area of airspace disease in the left upper lobe. Possible small left pleural effusion. No right pleural effusion. No pneumothorax. Stable cardiomediastinal silhouette. Thoracic aortic  atherosclerosis. No acute osseous abnormality. IMPRESSION: 1. Hazy left lower lobe airspace disease. Small area of airspace disease in the left upper lobe. Findings are concerning for an infectious etiology. Electronically Signed   By: Kathreen Devoid   On: 04/20/2018 15:08   Ct Renal Stone Study  Result Date: 04/20/2018 CLINICAL DATA:  58 year old female with abdominal pain EXAM: CT ABDOMEN AND PELVIS WITHOUT CONTRAST TECHNIQUE: Multidetector CT imaging of the abdomen and pelvis was performed following the standard protocol without IV contrast. COMPARISON:  None. FINDINGS: Lower chest: Respiratory motion limits evaluation. Atelectasis at the left lung base. Hepatobiliary: Cranial caudal span of the right liver measures greater than 18 cm. No focal lesion or nodular contour. Cholecystectomy. No intrahepatic or extrahepatic biliary ductal dilatation. Pancreas: Unremarkable pancreas Spleen: Unremarkable spleen Adrenals/Urinary Tract: Unremarkable adrenal glands. Right kidney demonstrates regions of cortical thinning. No hydronephrosis. Vascular calcifications in the hilum of the right kidney. Unremarkable course of the right ureter. Left kidney demonstrates no hydronephrosis. Likely nonobstructive stone in the inferior collecting system, versus vascular calcifications. Unremarkable course the left ureter. Urinary bladder partially distended. Stomach/Bowel: Unremarkable stomach. Unremarkable small bowel. Moderate to large formed stool burden. No focal inflammatory changes. Normal appendix. Vascular/Lymphatic: Vascular calcifications of the aorta and bilateral iliac arteries. Venous collaterals on the anterior pelvic wall favored to reflect chronic left iliac main stenosis/occlusion. Reproductive: Unremarkable appearance of uterus. Other: Hazy edema/infiltration of the musculature associated with the left eye fragment a cruise. There are small gas locules present. This is in continuity with the anterior aspect of the  L1-L2 disc space, which appears relatively widened compared to adjacent disc spaces. Musculoskeletal: Vacuum disc phenomenon at T12-L1. Compression fracture of L1. Irregularity of the inferior endplate with questionable disc space widening of L1-L2. Ill-defined soft tissue extends from the anterior aspect of the L1-L2 disc space into the left eye frag medic cruise. Degenerative changes of the lower lumbar spine. Vacuum disc phenomenon at L4-L5. IMPRESSION: Soft tissue changes of the left diaphragmatic cruz with small gas locules concerning for infection with gas-forming organism. This is in continuity with the anterior aspect of the L1-L2 disc space, and the source may be secondary to discitis/osteomyelitis at this site. Evaluation with MRI with contrast may be useful. Compression fracture of L1 of indeterminate age. These results were discussed by telephone at the time of interpretation on 04/20/2018 at 4:10 pm with Dr. Mariea Clonts. Aortic Atherosclerosis (ICD10-I70.0). 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 Correlation with a prior history of DVT may be useful. Hepatomegaly. Electronically Signed   By: Corrie Mckusick D.O.   On: 04/20/2018 16:14  ASSESSMENT AND PLAN      Acute on chronic hypoxic Respiratory Failure           -Rule out novel coronavirus infection -Background history of CHF and COPD- no signs of pulmonary edema on CT or x-ray, no clinical signs of acute COPD exacerbation at this time -continue Full MV support -continue Bronchodilator Therapy -Wean Fio2 and PEEP as tolerated -will perform SAT/SBT when respiratory parameters are met   Acute coronary syndrome    - EKG without STelevations of new LBBB    - initiating modified acs protocol for now    - cardiology on case - appreciate recommendations - Dr Clayborn Bigness    Mixed acid base disorder with high anion gap metabolic acidosis and metabolic alkalosis-resolved         -Complicated by pseudohyponatremia, hypoalbuminemia  with hyporchloremia         -Likely due to DKA and sepsis         -Urine ketones pending-initiating phase 1 DKA protocol    Atrial fibrillation with rapid ventricular response -resolved                 -Cardizem gtt.stopped now on PO -oxygen as needed -follow up cardiac enzymes as indicated-troponin trending up Cardiology consultation-appreciate recommendations ICU monitoring     Moderate protein calorie malnutrition-albumin 2.1     -Dietary consultation-appreciate recommendation    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            -hx of bilateral pulmonary nodule with hilar and mediastinal lymphadenopathy           - hold bronchodilator therapy for now as patient is in [place]           - outpatient appt made with [facility] [place] [facility] pulmonology           -incentive spirometry and flutter valve for BPH    Acute kidney Injury- KDIGO stage I - most likely due to pre-renal azotemia vs ATN -Resuscitative fluids- 20cc/kg due to her CHF status -follow chem 7 -follow UO -continue Foley Catheter-assess need daily   Sepsis   -Due to staph aureus - bacteremia -follow ABG and LA -follow up cultures -emperic ABX -consider stress dose steroids   ID -continue IV abx as prescibed -follow up cultures  GI/Nutrition GI PROPHYLAXIS as indicated DIET-->TF's as tolerated Constipation protocol as indicated  ENDO - ICU hypoglycemic\Hyperglycemia protocol -check FSBS per protocol   ELECTROLYTES -follow labs as needed -replace as needed -pharmacy consultation   DVT/GI PRX ordered -SCDs  TRANSFUSIONS AS NEEDED MONITOR FSBS ASSESS the need for LABS as needed   Critical care provider statement:   Critical care time (minutes): 125  Critical care time was exclusive of: Separately billable procedures and treating other patients  Critical care was necessary to treat or prevent imminent or life-threatening deterioration of the  following conditions:  Severe hyperglycemia, atrial fibrillation with rapid ventricular response, discitis, acute kidney injury, protein calorie malnutrition, sepsis due to UTI with possible bacteremia, acute on chronic hypoxemic respiratory failure with possible novel coronavirus infection.   Critical care was time spent personally by me on the following activities: Development of treatment plan with patient or surrogate, discussions with consultants, evaluation of patient's response to treatment, examination of patient, obtaining history from patient or surrogate, ordering and performing treatments and interventions, ordering and review of laboratory studies and re-evaluation of patient's condition.  I assumed direction of critical care for this patient from another provider in my specialty:  no   Ottie Glazier, M.D.  Division of [place]

## 2018-04-22 ENCOUNTER — Inpatient Hospital Stay: Payer: Medicare Other

## 2018-04-22 DIAGNOSIS — L8990 Pressure ulcer of unspecified site, unspecified stage: Secondary | ICD-10-CM

## 2018-04-22 DIAGNOSIS — R410 Disorientation, unspecified: Secondary | ICD-10-CM

## 2018-04-22 DIAGNOSIS — R443 Hallucinations, unspecified: Secondary | ICD-10-CM

## 2018-04-22 LAB — MAGNESIUM: Magnesium: 2.1 mg/dL (ref 1.7–2.4)

## 2018-04-22 LAB — CBC
HCT: 40.6 % (ref 36.0–46.0)
Hemoglobin: 12.6 g/dL (ref 12.0–15.0)
MCH: 24.6 pg — ABNORMAL LOW (ref 26.0–34.0)
MCHC: 31 g/dL (ref 30.0–36.0)
MCV: 79.1 fL — ABNORMAL LOW (ref 80.0–100.0)
Platelets: 352 K/uL (ref 150–400)
RBC: 5.13 MIL/uL — ABNORMAL HIGH (ref 3.87–5.11)
RDW: 18.4 % — ABNORMAL HIGH (ref 11.5–15.5)
WBC: 21.2 K/uL — ABNORMAL HIGH (ref 4.0–10.5)
nRBC: 0 % (ref 0.0–0.2)

## 2018-04-22 LAB — HEPARIN LEVEL (UNFRACTIONATED): Heparin Unfractionated: <0.1 [IU]/mL — ABNORMAL LOW (ref 0.30–0.70)

## 2018-04-22 LAB — GLUCOSE, CAPILLARY
Glucose-Capillary: 201 mg/dL — ABNORMAL HIGH (ref 70–99)
Glucose-Capillary: 134 mg/dL — ABNORMAL HIGH (ref 70–99)
Glucose-Capillary: 107 mg/dL — ABNORMAL HIGH (ref 70–99)
Glucose-Capillary: 200 mg/dL — ABNORMAL HIGH (ref 70–99)
Glucose-Capillary: 156 mg/dL — ABNORMAL HIGH (ref 70–99)

## 2018-04-22 LAB — TROPONIN I
Troponin I: 11.28 ng/mL (ref <0.03)
Troponin I: 26.42 ng/mL (ref <0.03)
Troponin I: 26.52 ng/mL (ref <0.03)

## 2018-04-22 LAB — BASIC METABOLIC PANEL
Anion gap: 10 (ref 5–15)
BUN: 44 mg/dL — ABNORMAL HIGH (ref 6–20)
CO2: 29 mmol/L (ref 22–32)
Calcium: 9 mg/dL (ref 8.9–10.3)
Chloride: 97 mmol/L — ABNORMAL LOW (ref 98–111)
Creatinine, Ser: 0.87 mg/dL (ref 0.44–1.00)
GFR calc Af Amer: >60 mL/min (ref >60)
GFR calc non Af Amer: >60 mL/min (ref >60)
Glucose, Bld: 156 mg/dL — ABNORMAL HIGH (ref 70–99)
Potassium: 4.6 mmol/L (ref 3.5–5.1)
Sodium: 136 mmol/L (ref 135–145)

## 2018-04-22 LAB — URINE CULTURE: Culture: >=100000 — AB

## 2018-04-22 LAB — PHOSPHORUS: Phosphorus: 4.3 mg/dL (ref 2.5–4.6)

## 2018-04-22 LAB — HIV ANTIBODY (ROUTINE TESTING W REFLEX): HIV Screen 4th Generation wRfx: NONREACTIVE

## 2018-04-22 LAB — APTT: aPTT: 41 seconds — ABNORMAL HIGH (ref 24–36)

## 2018-04-22 MED ORDER — LORAZEPAM 2 MG/ML IJ SOLN
INTRAMUSCULAR | Status: AC
  Administered 2018-04-22: 12:00:00
  Filled 2018-04-22: qty 1

## 2018-04-22 MED ORDER — LORAZEPAM 2 MG/ML IJ SOLN
1.0000 mg | Freq: Once | INTRAMUSCULAR | Status: AC
  Administered 2018-04-22: 1 mg via INTRAVENOUS

## 2018-04-22 MED ORDER — QUETIAPINE FUMARATE 25 MG PO TABS
25.0000 mg | ORAL_TABLET | Freq: Once | ORAL | Status: AC
  Administered 2018-04-22: 25 mg via ORAL
  Filled 2018-04-22: qty 1

## 2018-04-22 MED ORDER — QUETIAPINE FUMARATE 25 MG PO TABS
25.0000 mg | ORAL_TABLET | ORAL | Status: DC
  Administered 2018-04-22: 20:00:00 25 mg via ORAL
  Filled 2018-04-22 (×3): qty 1

## 2018-04-22 MED ORDER — HEPARIN BOLUS VIA INFUSION
1800.0000 [IU] | Freq: Once | INTRAVENOUS | Status: AC
  Administered 2018-04-22: 1800 [IU] via INTRAVENOUS
  Filled 2018-04-22: qty 1800

## 2018-04-22 MED ORDER — ENOXAPARIN SODIUM 80 MG/0.8ML ~~LOC~~ SOLN
65.0000 mg | Freq: Once | SUBCUTANEOUS | Status: AC
  Administered 2018-04-22: 65 mg via SUBCUTANEOUS
  Filled 2018-04-22: qty 0.8

## 2018-04-22 MED ORDER — ENOXAPARIN SODIUM 80 MG/0.8ML ~~LOC~~ SOLN
65.0000 mg | Freq: Two times a day (BID) | SUBCUTANEOUS | Status: DC
  Administered 2018-04-22 – 2018-04-25 (×7): 65 mg via SUBCUTANEOUS
  Filled 2018-04-22 (×9): qty 0.8

## 2018-04-22 MED ORDER — ALPRAZOLAM 0.5 MG PO TABS
0.5000 mg | ORAL_TABLET | Freq: Three times a day (TID) | ORAL | Status: DC | PRN
  Filled 2018-04-22: qty 1

## 2018-04-22 MED ORDER — SODIUM CHLORIDE 0.9 % IV SOLN
0.0000 ug/min | INTRAVENOUS | Status: DC
  Administered 2018-04-22: 25 ug/min via INTRAVENOUS
  Filled 2018-04-22: qty 4
  Filled 2018-04-22: qty 40
  Filled 2018-04-22: qty 4

## 2018-04-22 NOTE — Procedures (Deleted)
FLEXIBLE BRONCHOSCOPY PROCEDURE NOTE    Flexible bronchoscopy was performed on 04/22/18 by : Floetta Brickey MD  assistance by : 1)Aleksey RN  and 2)Brantley RT   Indication for the procedure was : Septic shock with prolonged acute hypoxic respiratory failure   Medication list reviewed: y  The patient's interval history was taken and revealed: no new complaints The pre- procedure physical examination revealed: No new findings Refer to prior clinic note for details.  Informed Consent: emergent due to acute hypoxemia on MV, procedure discussed with husband by me.     Procedural Preparation: Time out was performed and patient was identified by name and birthdate and procedure to be performed and side for sampling, if any, was specified. Pt was intubated by anesthesia.   Procedure Findings:   Bronchoscope was inserted via ETT  without difficulty.  Posterior oropharynx, epiglottis, arytenoids, false cords and vocal cords were not visualized as these were bypassed by endotracheal tube. The distal trachea was normal in circumference and appearance without mucosal, cartilaginous or branching abnormalities.  The main carina was sharp nonsplayed . All right and left lobar airways were visualized to the Subsegmental level.  Sub- sub segmental carinae were identified in all the distal airways.  Secretions were visible in the following airways and appeared to be darkened phlegm. The mucosa was : non-edematous and nonfriable  Airways were notable for:        exophytic lesions :none       extrinsic compression in the following distributions: RML.       Friable mucosa: none       Anthrocotic material /pigmentation: none   Pictorial documentation attached: n      Specimens obtained included:   Broncho-alveolar lavage site:RML  sent for microbiology                             60ml volume infused 40ml volume returned with cloudy with darkened debris in  appearance   The bronchoscopy was terminated due to completion of the planned procedure and the bronchoscope was removed.    Moderate sedation used:   Versed 2 mg  Estimated Blood loss: 0cc.  Complications included:  none   Preliminary CXR findings :  n/a  Disposition: pt remains critically ill in MICU     Fred Novali Vollman MD  [facility] Division of Pulmonary & Critical Care Medicine 

## 2018-04-22 NOTE — Consult Note (Signed)
ANTICOAGULATION CONSULT NOTE - Initial Consult  Pharmacy Consult for Heparin Dosing and Monitoring Indication: chest pain/ACS, Elevated troponin, HX of VTE   Allergies  Allergen Reactions  . Skin Protectants, Misc. Rash  . Flagyl [Metronidazole]   . Gabapentin   . Levetiracetam   . Tape Other (See Comments)    blisters  . Ace Inhibitors Rash  . Ertapenem Rash  . Sertraline Hcl Rash    Patient Measurements: Height: 5\' 3"  (160 cm) Weight: 143 lb 11.8 oz (65.2 kg) IBW/kg (Calculated) : 52.4  Vital Signs: Temp: 97.9 F (36.6 C) (04/03 0345) Temp Source: Oral (04/03 0345) BP: 107/53 (04/03 0345) Pulse Rate: 71 (04/03 0345)  Labs: Recent Labs    04/20/18 1429 04/20/18 1654  04/21/18 0355  04/21/18 1200 04/21/18 1534 04/22/18 0217  HGB 13.0  --   --  13.4  --   --   --  12.6  HCT 39.8  --   --  41.2  --   --   --  40.6  PLT 320  --   --  330  --   --   --  352  APTT  --   --   --  144*  --   --  47*  --   LABPROT  --  >90.0*  --  >90.0*  --   --  20.2*  --   INR  --  >10.0*  --  >10.0*  --   --  1.8*  --   HEPARINUNFRC  --   --   --   --   --   --   --  <0.10*  CREATININE 1.16*  --    < > 0.75   < > 0.85 0.72 0.87  TROPONINI 0.99*  --    < > 2.29*   < > 3.16* 2.04* 11.28*   < > = values in this interval not displayed.    Estimated Creatinine Clearance: 64.8 mL/min (by C-G formula based on SCr of 0.87 mg/dL).   Medical History: Past Medical History:  Diagnosis Date  . Asthma   . CHF (congestive heart failure) (HCC)   . Chronic pain   . COPD (chronic obstructive pulmonary disease) (HCC)   . Diabetes mellitus (HCC)   . Peripheral vascular disease (HCC)   . Stroke (cerebrum) [facility])     Assessment: Pharmacy consulted for heparin dosing and monitoring for ACS/STEMI/Elevated Troponin and PMH of VTE. For a 58 yo female admitted with MSSA bacteremia. Patient was taking warfarin PTA and was >10 INR on admission.   4/2 heparin infusion started @ 750 units/hr   Goal  of Therapy:  Heparin level 0.3-0.7 units/ml aPTT 66-102s seconds Monitor platelets by anticoagulation protocol: Yes   Plan:  4/3 @ 0349 HL <0.10. Will order 1800 unit bolus and increase infusion to 950 units/hr. Will recheck HL in 6 hours.   Pharmacy will continue to monitor and adjust per consult.   Gardner Candle, PharmD, BCPS Clinical Pharmacist 04/22/2018 4:19 AM

## 2018-04-22 NOTE — Progress Notes (Addendum)
Late note 1223 Remains confused and aggressive verbally. Given 1 mg Lorazpam IVP and stayed with patient. Patient calmed down but was never oriented or would answer questions appropriately. 1300 sleeping at intervals. Troponin increased to 26.42.. Dr. Lady Gary notified. Late note-calmer after sedated with Lorazepam. Still refused to take scheduled pain medication at 1800. Still hallucinating and confused but not verbally aggressive. Repositioned for comfort. NeoSynephrine weaned as tolerated.

## 2018-04-22 NOTE — Progress Notes (Addendum)
Date of Admission:  04/20/2018   T   ID: Alexandra Goodman is a 58 y.o. female  Active Problems:   Sepsis (HCC) MSSA bacteremia  Subjective: as per her nurse some confusion and hallucination afebrile Medications:  . aspirin EC  81 mg Oral Daily  . atorvastatin  80 mg Oral Q2000  . clopidogrel  75 mg Oral Daily  . famotidine  40 mg Oral QPM  . FLUoxetine  20 mg Oral Daily  . insulin aspart  0-20 Units Subcutaneous Q4H  . insulin glargine  46 Units Subcutaneous Daily  . levothyroxine  88 mcg Oral Q0600  . mouth rinse  15 mL Mouth Rinse BID  . metoprolol succinate  100 mg Oral Daily  . mirabegron ER  25 mg Oral Daily  . mometasone-formoterol  2 puff Inhalation BID  . montelukast  10 mg Oral Daily  . morphine  30 mg Oral Q12H  . oxyCODONE-acetaminophen  1 tablet Oral 5 X Daily   And  . oxyCODONE  5 mg Oral 5 X Daily  . sodium chloride flush  10-40 mL Intracatheter Q12H  . tiotropium  1 capsule Inhalation Daily  . traZODone  150 mg Oral QHS    Objective: Vital signs in last 24 hours: Temp:  [97.9 F (36.6 C)-98.7 F (37.1 C)] 97.9 F (36.6 C) (04/03 0345) Pulse Rate:  [41-135] 122 (04/03 0900) Resp:  [11-25] 25 (04/03 0900) BP: (63-128)/(34-103) 104/60 (04/03 0900) SpO2:  [81 %-100 %] 88 % (04/03 0900)  PHYSICAL EXAM:  Because of contact/airborne isolation and to save PPE did not go into the patient room- discussed with her nurse and saw patient thru the glass window Lab Results Recent Labs    04/21/18 0355  04/21/18 1534 04/22/18 0217  WBC 20.3*  --   --  21.2*  HGB 13.4  --   --  12.6  HCT 41.2  --   --  40.6  NA 132*   < > 131* 136  K 3.7   < > 3.7 4.6  CL 97*   < > 94* 97*  CO2 26   < > 27 29  BUN 45*   < > 40* 44*  CREATININE 0.75   < > 0.72 0.87   < > = values in this interval not displayed.   Liver Panel Recent Labs    04/20/18 1429  PROT 6.1*  ALBUMIN 2.1*  AST 21  ALT 22  ALKPHOS 152*  BILITOT 0.7   Sedimentation Rate No results for  input(s): ESRSEDRATE in the last 72 hours. C-Reactive Protein No results for input(s): CRP in the last 72 hours.  Microbiology:  Studies/Results: Dg Chest Port 1 View  Result Date: 04/22/2018 CLINICAL DATA:  Acute respiratory failure.  History of CHF and COPD. EXAM: PORTABLE CHEST 1 VIEW COMPARISON:  Chest radiograph April 20, 2018 FINDINGS: Cardiac silhouette is mildly enlarged. Coronary artery stent. Calcified aortic arch. Worsening retrocardiac airspace opacity with small LEFT pleural effusion. Persistent small LEFT upper lobe nodular density. New bandlike density RIGHT lung base. Mild chronic interstitial changes. No pneumothorax. Soft tissue planes and included osseous structures are non suspicious. Multiple EKG lines overlie the patient and may obscure subtle underlying pathology. IMPRESSION: 1. Worsening retrocardiac consolidation with small pleural effusion. New RIGHT lung base atelectasis, less likely pneumonia. Followup PA and lateral chest X-ray is recommended in 3-4 weeks following trial of antibiotic therapy to ensure resolution and exclude underlying malignancy. 2. Stable nodular density LEFT  upper lobe. 3. Mild cardiomegaly. Electronically Signed   By: Awilda Metro M.D.   On: 04/22/2018 03:58   Dg Chest Port 1 View  Result Date: 04/20/2018 CLINICAL DATA:  Fever, multiple complaint EXAM: PORTABLE CHEST 1 VIEW COMPARISON:  01/14/2013 FINDINGS: Mild bilateral interstitial thickening. Hazy left lower lobe airspace disease. Small area of airspace disease in the left upper lobe. Possible small left pleural effusion. No right pleural effusion. No pneumothorax. Stable cardiomediastinal silhouette. Thoracic aortic atherosclerosis. No acute osseous abnormality. IMPRESSION: 1. Hazy left lower lobe airspace disease. Small area of airspace disease in the left upper lobe. Findings are concerning for an infectious etiology. Electronically Signed   By: Elige Ko   On: 04/20/2018 15:08   Ct Renal  Stone Study  Result Date: 04/20/2018 CLINICAL DATA:  58 year old female with abdominal pain EXAM: CT ABDOMEN AND PELVIS WITHOUT CONTRAST TECHNIQUE: Multidetector CT imaging of the abdomen and pelvis was performed following the standard protocol without IV contrast. COMPARISON:  None. FINDINGS: Lower chest: Respiratory motion limits evaluation. Atelectasis at the left lung base. Hepatobiliary: Cranial caudal span of the right liver measures greater than 18 cm. No focal lesion or nodular contour. Cholecystectomy. No intrahepatic or extrahepatic biliary ductal dilatation. Pancreas: Unremarkable pancreas Spleen: Unremarkable spleen Adrenals/Urinary Tract: Unremarkable adrenal glands. Right kidney demonstrates regions of cortical thinning. No hydronephrosis. Vascular calcifications in the hilum of the right kidney. Unremarkable course of the right ureter. Left kidney demonstrates no hydronephrosis. Likely nonobstructive stone in the inferior collecting system, versus vascular calcifications. Unremarkable course the left ureter. Urinary bladder partially distended. Stomach/Bowel: Unremarkable stomach. Unremarkable small bowel. Moderate to large formed stool burden. No focal inflammatory changes. Normal appendix. Vascular/Lymphatic: Vascular calcifications of the aorta and bilateral iliac arteries. Venous collaterals on the anterior pelvic wall favored to reflect chronic left iliac main stenosis/occlusion. Reproductive: Unremarkable appearance of uterus. Other: Hazy edema/infiltration of the musculature associated with the left eye fragment a cruise. There are small gas locules present. This is in continuity with the anterior aspect of the L1-L2 disc space, which appears relatively widened compared to adjacent disc spaces. Musculoskeletal: Vacuum disc phenomenon at T12-L1. Compression fracture of L1. Irregularity of the inferior endplate with questionable disc space widening of L1-L2. Ill-defined soft tissue extends from  the anterior aspect of the L1-L2 disc space into the left eye frag medic cruise. Degenerative changes of the lower lumbar spine. Vacuum disc phenomenon at L4-L5. IMPRESSION: Soft tissue changes of the left diaphragmatic cruz with small gas locules concerning for infection with gas-forming organism. This is in continuity with the anterior aspect of the L1-L2 disc space, and the source may be secondary to discitis/osteomyelitis at this site. Evaluation with MRI with contrast may be useful. Compression fracture of L1 of indeterminate age. These results were discussed by telephone at the time of interpretation on 04/20/2018 at 4:10 pm with Dr. Sharma Covert. Aortic Atherosclerosis (ICD10-I70.0). 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 Correlation with a prior history of DVT may be useful. Hepatomegaly. Electronically Signed   By: Gilmer Mor D.O.   On: 04/20/2018 16:14   [place] Ekg Site Rite  Result Date: 04/21/2018 If Site Rite image not attached, placement could not be confirmed due to current cardiac rhythm.    Assessment/Plan: MSSA bacteremia with L1-L2 discitis Likely source for MSSA is the rt great toe ulcer On  nafcillin 2 grams IV q 4 because of increased bioburden and discitis- Re[peat blood culture AM Will need TEE once covid 19 results and negative  Being tested for COVID- not high risk for getting COVID- because of O2 needs on airborne  COPD exacerbation with hypoxia  DM- management as per primary team Afib RVR intermittent  ID will follow peripherally over the weekend- on call ID available by phone

## 2018-04-22 NOTE — Consult Note (Addendum)
Reason for Consult: atrial fibrillation elevated troponin  Referring Physician:  Dr. Cherlynn Kaiser hospitalist,  Dr. Altamease Oiler primary  Alexandra STRENG is an 58 y.o. female.  HPI:  Patient is a 58 year old obese white female multiple medical problems including congestive heart failure preserved left ventricular function Chronic obstructive pulmonary disease hypertension diabetes peripheral vascular disease. Patient has had previous DVT diabetic neuropathy came to the hospital divide for primary physician because of evaluation possible urine tract infection or renal colic.  Patient has not been feeling well in the past few weeks primary physician to [facility] complained of lower back pain urinary frequency hesitancy urgency.  Patient was positive for Staph aureus and urine cultures.  Patient was advised to come to emergency room for further assessment.  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 Asthma   . CHF (congestive heart failure) (HCC)   . Chronic pain   . COPD (chronic obstructive pulmonary disease) (HCC)   . Diabetes mellitus (HCC)   . Peripheral vascular disease (HCC)   . Stroke (cerebrum) [facility])     Past Surgical History:  Procedure Laterality Date  . below the knee LLE amputation Left     Family History  Problem Relation Age of Onset  . Heart disease Mother   . Heart disease Father     Social History:  reports that she has been smoking cigarettes. She has a 90.00 pack-year smoking history. She has never used smokeless tobacco. She reports  previous alcohol use. She reports that she does not use drugs.  Allergies:  Allergies  Allergen Reactions  . Skin Protectants, Misc. Rash  . Flagyl [Metronidazole]   . Gabapentin   . Levetiracetam   . Tape Other (See Comments)    blisters  . Ace Inhibitors Rash  . Ertapenem Rash  . Sertraline Hcl Rash    Medications: I have reviewed the patient's current medications.  Results for orders placed or performed during the hospital encounter of 04/20/18 (from the past 48 hour(s))  Lactic acid, plasma     Status: Abnormal   Collection Time: 04/20/18  2:29 PM  Result Value Ref Range   Lactic Acid, Venous 2.0 (HH) 0.5 - 1.9 mmol/L    Comment: CRITICAL RESULT CALLED TO, READ BACK BY AND VERIFIED WITH AMY COYNE @1507  04/20/2018 MU/MLK Performed at [facility], [address] [place], [place] [address]   Comprehensive metabolic panel     Status: Abnormal   Collection Time: 04/20/18  2:29 PM  Result Value Ref Range   Sodium 124 (L) 135 - 145 mmol/L   Potassium 5.0 3.5 - 5.1 mmol/L   Chloride 88 (L) 98 - 111 mmol/L   CO2 25 22 - 32 mmol/L   Glucose, Bld 650 (HH) 70 - 99 mg/dL    Comment: CRITICAL RESULT CALLED TO, READ BACK BY AND VERIFIED WITH AMY COYNE @1507  04/20/2018 MU/MLK    BUN 62 (H) 6 - 20 mg/dL   Creatinine, Ser 6.04 (H) 0.44 - 1.00 mg/dL   Calcium 8.9 8.9 - 54.0 mg/dL   Total Protein 6.1 (L)  6.5 - 8.1 g/dL   Albumin 2.1 (L) 3.5 - 5.0 g/dL   AST 21 15 - 41 U/L   ALT 22 0 - 44 U/L   Alkaline Phosphatase 152 (H) 38 - 126 U/L   Total Bilirubin 0.7 0.3 - 1.2 mg/dL   GFR calc non Af Amer 52 (L) >60 mL/min   GFR calc Af Amer >60 >60 mL/min   Anion gap 11 5 - 15    Comment: Performed at [facility], [address] [place], [place] [address]  CBC WITH DIFFERENTIAL     Status: Abnormal   Collection Time: 04/20/18  2:29 PM  Result Value Ref Range   WBC 22.0 (H) 4.0 - 10.5 K/uL   RBC 5.18 (H) 3.87 - 5.11 MIL/uL   Hemoglobin 13.0 12.0 - 15.0 g/dL   HCT  46.9 62.9 - 52.8 %   MCV 76.8 (L) 80.0 - 100.0 fL   MCH 25.1 (L) 26.0 - 34.0 pg   MCHC 32.7 30.0 - 36.0 g/dL   RDW 41.3 (H) 24.4 - 01.0 %   Platelets 320 150 - 400 K/uL   nRBC 0.0 0.0 - 0.2 %   Neutrophils Relative % 91 %   Neutro Abs 20.1 (H) 1.7 - 7.7 K/uL   Lymphocytes Relative 3 %   Lymphs Abs 0.7 0.7 - 4.0 K/uL   Monocytes Relative 4 %   Monocytes Absolute 0.8 0.1 - 1.0 K/uL   Eosinophils Relative 1 %   Eosinophils Absolute 0.2 0.0 - 0.5 K/uL   Basophils Relative 0 %   Basophils Absolute 0.1 0.0 - 0.1 K/uL   Immature Granulocytes 1 %   Abs Immature Granulocytes 0.15 (H) 0.00 - 0.07 K/uL    Comment: Performed at [facility], [address] [place], [place] [address]  Troponin I - ONCE - STAT     Status: Abnormal   Collection Time: 04/20/18  2:29 PM  Result Value Ref Range   Troponin I 0.99 (HH) <0.03 ng/mL    Comment: CRITICAL RESULT CALLED TO, READ BACK BY AND VERIFIED WITH AMY COYNE @1507  04/20/2018 MU/MLK Performed at [facility], [address] [place], [place] [address]   Glucose, capillary     Status: Abnormal   Collection Time: 04/20/18  2:32 PM  Result Value Ref Range   Glucose-Capillary >600 (HH) 70 - 99 mg/dL  Blood gas, venous ([facility], [facility], [facility])     Status: Abnormal   Collection Time: 04/20/18  2:34 PM  Result Value Ref Range   pH, Ven 7.36 7.250 - 7.430   pCO2, Ven 51 44.0 - 60.0 mmHg   pO2, Ven 43.0 32.0 - 45.0 mmHg   Bicarbonate 28.8 (H) 20.0 - 28.0 mmol/L   Acid-Base Excess 2.4 (H) 0.0 - 2.0 mmol/L   O2 Saturation 76.4 %   Patient temperature 37.0    Collection site VEIN    Sample type VENOUS     Comment: Performed at [facility], [address] [place], [place] [address]  Blood Culture (routine x 2)     Status: None (Preliminary result)   Collection Time: 04/20/18  2:46 PM  Result Value Ref Range   Specimen Description BLOOD BLOOD RIGHT FOREARM    Special Requests      BOTTLES DRAWN AEROBIC AND ANAEROBIC Blood Culture  results may not be optimal due to an excessive volume of blood received in culture bottles   Culture  Setup Time      Organism  ID to follow GRAM POSITIVE COCCI IN BOTH AEROBIC AND ANAEROBIC BOTTLES CRITICAL RESULT CALLED TO, READ BACK BY AND VERIFIED WITH: [facility] HALLAJI 04/21/2018 222 REC Performed at [facility], [address] [place], [place] [address]    Culture GRAM POSITIVE COCCI    Report Status PENDING   Blood Culture (routine x 2)     Status: None (Preliminary result)   Collection Time: 04/20/18  2:46 PM  Result Value Ref Range   Specimen Description BLOOD LEFT ANTECUBITAL    Special Requests      BOTTLES DRAWN AEROBIC AND ANAEROBIC Blood Culture adequate volume   Culture  Setup Time      GRAM POSITIVE COCCI IN BOTH AEROBIC AND ANAEROBIC BOTTLES CRITICAL VALUE NOTED.  VALUE IS CONSISTENT WITH PREVIOUSLY REPORTED AND CALLED VALUE. Performed at [facility], [address] [place], [place] [address]    Culture GRAM POSITIVE COCCI    Report Status PENDING   Urine culture     Status: Abnormal (Preliminary result)   Collection Time: 04/20/18  2:46 PM  Result Value Ref Range   Specimen Description      URINE, RANDOM Performed at [facility], [address] [place], [place] [address]    Special Requests      NONE Performed at [facility], [address], [place], [place] [address]    Culture (A)     >=100,000 COLONIES/mL STAPHYLOCOCCUS AUREUS SUSCEPTIBILITIES TO FOLLOW Performed at [facility], [address], [place], [place] [address]    Report Status PENDING   Urinalysis, Complete w Microscopic     Status: Abnormal   Collection Time: 04/20/18  2:46 PM  Result Value Ref Range   Color, Urine YELLOW (A) YELLOW   APPearance HAZY (A) CLEAR   Specific Gravity, Urine 1.016 1.005 - 1.030   pH 6.0 5.0 - 8.0   Glucose, UA >=500 (A) NEGATIVE mg/dL   Hgb urine dipstick MODERATE (A) NEGATIVE   Bilirubin Urine NEGATIVE  NEGATIVE   Ketones, ur NEGATIVE NEGATIVE mg/dL   Protein, ur NEGATIVE NEGATIVE mg/dL   Nitrite POSITIVE (A) NEGATIVE   Leukocytes,Ua SMALL (A) NEGATIVE   RBC / HPF 0-5 0 - 5 RBC/hpf   WBC, UA 11-20 0 - 5 WBC/hpf   Bacteria, UA NONE SEEN NONE SEEN   Squamous Epithelial / LPF 0-5 0 - 5   WBC Clumps PRESENT    Mucus PRESENT     Comment: Performed at [facility], [address] [place], [place] [address]  Ketones, urine     Status: None   Collection Time: 04/20/18  2:46 PM  Result Value Ref Range   Ketones, ur NEGATIVE NEGATIVE mg/dL    Comment: Performed at [facility], [address] [place], [place] [address]  Blood Culture ID Panel (Reflexed)     Status: Abnormal   Collection Time: 04/20/18  2:46 PM  Result Value Ref Range   Enterococcus species NOT DETECTED NOT DETECTED   Listeria monocytogenes NOT DETECTED NOT DETECTED   Staphylococcus species DETECTED (A) NOT DETECTED    Comment: CRITICAL RESULT CALLED TO, READ BACK BY AND VERIFIED WITH: SHEEMA HALLAJI 04/21/2018 222 REC    Staphylococcus aureus (BCID) DETECTED (A) NOT DETECTED    Comment: Methicillin (oxacillin) susceptible Staphylococcus aureus (MSSA). Preferred therapy is anti staphylococcal beta lactam antibiotic (Cefazolin or Nafcillin), unless clinically contraindicated. CRITICAL RESULT CALLED TO, READ BACK BY AND VERIFIED WITH: SHEEMA HALLAJI 04/21/2018 222 REC  Methicillin resistance NOT DETECTED NOT DETECTED   Streptococcus species NOT DETECTED NOT DETECTED   Streptococcus agalactiae NOT DETECTED NOT DETECTED   Streptococcus pneumoniae NOT DETECTED NOT DETECTED   Streptococcus pyogenes NOT DETECTED NOT DETECTED   Acinetobacter baumannii NOT DETECTED NOT DETECTED   Enterobacteriaceae species NOT DETECTED NOT DETECTED   Enterobacter cloacae complex NOT DETECTED NOT DETECTED   Escherichia coli NOT DETECTED NOT DETECTED   Klebsiella oxytoca NOT DETECTED NOT DETECTED   Klebsiella pneumoniae NOT  DETECTED NOT DETECTED   Proteus species NOT DETECTED NOT DETECTED   Serratia marcescens NOT DETECTED NOT DETECTED   Haemophilus influenzae NOT DETECTED NOT DETECTED   Neisseria meningitidis NOT DETECTED NOT DETECTED   Pseudomonas aeruginosa NOT DETECTED NOT DETECTED   Candida albicans NOT DETECTED NOT DETECTED   Candida glabrata NOT DETECTED NOT DETECTED   Candida krusei NOT DETECTED NOT DETECTED   Candida parapsilosis NOT DETECTED NOT DETECTED   Candida tropicalis NOT DETECTED NOT DETECTED    Comment: Performed at [facility], [address] [place], [place] [address]  Glucose, capillary     Status: Abnormal   Collection Time: 04/20/18  4:37 PM  Result Value Ref Range   Glucose-Capillary >600 (HH) 70 - 99 mg/dL  Protime-INR     Status: Abnormal   Collection Time: 04/20/18  4:54 PM  Result Value Ref Range   Prothrombin Time >90.0 (H) 11.4 - 15.2 seconds   INR >10.0 (HH) 0.8 - 1.2    Comment: CRITICAL RESULT CALLED TO, READ BACK BY AND VERIFIED WITH: RENEE BABB 04/20/18 1930 KLW (NOTE) INR goal varies based on device and disease states. Performed at [facility], [address] [place], [place] [address]   Lactic acid, plasma     Status: Abnormal   Collection Time: 04/20/18  6:25 PM  Result Value Ref Range   Lactic Acid, Venous 2.9 (HH) 0.5 - 1.9 mmol/L    Comment: CRITICAL RESULT CALLED TO, READ BACK BY AND VERIFIED WITH TAYLOR FOSTER 04/20/18 @ 1904  MLK Performed at [facility], [address] [place], [place] [address]   Glucose, capillary     Status: Abnormal   Collection Time: 04/20/18  6:56 PM  Result Value Ref Range   Glucose-Capillary 565 (HH) 70 - 99 mg/dL  MRSA PCR Screening     Status: None   Collection Time: 04/20/18  7:04 PM  Result Value Ref Range   MRSA by PCR NEGATIVE NEGATIVE    Comment:        The GeneXpert MRSA Assay (FDA approved for NASAL specimens only), is one component of a comprehensive MRSA  colonization surveillance program. It is not intended to diagnose MRSA infection nor to guide or monitor treatment for MRSA infections. Performed at [facility], [address] [place], [place] [address]   Blood gas, arterial     Status: Abnormal   Collection Time: 04/20/18  7:36 PM  Result Value Ref Range   Delivery systems HI FLOW NASAL CANNULA     Comment: 10 LITERS   pH, Arterial 7.40 7.350 - 7.450   pCO2 arterial 43 32.0 - 48.0 mmHg   pO2, Arterial 75 (L) 83.0 - 108.0 mmHg   Bicarbonate 26.6 20.0 - 28.0 mmol/L   Acid-Base Excess 1.5 0.0 - 2.0 mmol/L   O2 Saturation 94.9 %   Patient temperature 37.0    Collection site RIGHT RADIAL    Sample type ARTERIAL DRAW    Allens test (pass/fail)  PASS PASS    Comment: Performed at [facility], [address] [place], [place] [address]  Troponin I - Now Then Q6H     Status: Abnormal   Collection Time: 04/20/18  7:39 PM  Result Value Ref Range   Troponin I 0.57 (HH) <0.03 ng/mL    Comment: CRITICAL VALUE NOTED. VALUE IS CONSISTENT WITH PREVIOUSLY REPORTED/CALLED VALUE SMA Performed at [facility], [address] [place], [place] [address]   Brain natriuretic peptide     Status: Abnormal   Collection Time: 04/20/18  7:39 PM  Result Value Ref Range   B Natriuretic Peptide 610.0 (H) 0.0 - 100.0 pg/mL    Comment: Performed at [facility], [address] [place], [place] [address]  HIV antibody (Routine Testing)     Status: None   Collection Time: 04/20/18  7:39 PM  Result Value Ref Range   HIV Screen 4th Generation wRfx Non Reactive Non Reactive    Comment: (NOTE) Performed At: [facility] [address] [place], [place] [id number] Jolene Schimke MD [id number]   Hemoglobin A1c     Status: Abnormal   Collection Time: 04/20/18  7:39 PM  Result Value Ref Range   Hgb A1c MFr Bld 9.6 (H) 4.8 - 5.6 %    Comment: (NOTE) Pre diabetes:          5.7%-6.4% Diabetes:               >6.4% Glycemic control for   <7.0% adults with diabetes    Mean Plasma Glucose 228.82 mg/dL    Comment: Performed at [facility], [address] [place], [place] [address]  Basic metabolic panel     Status: Abnormal   Collection Time: 04/20/18  7:39 PM  Result Value Ref Range   Sodium 125 (L) 135 - 145 mmol/L   Potassium 4.5 3.5 - 5.1 mmol/L   Chloride 87 (L) 98 - 111 mmol/L   CO2 22 22 - 32 mmol/L   Glucose, Bld 550 (HH) 70 - 99 mg/dL    Comment: CRITICAL RESULT CALLED TO, READ BACK BY AND VERIFIED WITH MICHELLE WILLIAMS AT 2031 04/20/2018 SMA    BUN 58 (H) 6 - 20 mg/dL   Creatinine, Ser 2.53 (H) 0.44 - 1.00 mg/dL   Calcium 8.8 (L) 8.9 - 10.3 mg/dL   GFR calc non Af Amer 56 (L) >60 mL/min   GFR calc Af Amer >60 >60 mL/min   Anion gap 16 (H) 5 - 15    Comment: Performed at [facility], [address] [place], [place] [address]  Beta-hydroxybutyric acid     Status: Abnormal   Collection Time: 04/20/18  7:47 PM  Result Value Ref Range   Beta-Hydroxybutyric Acid 0.47 (H) 0.05 - 0.27 mmol/L    Comment: Performed at [facility], [address] [place], [place] [address]  Glucose, capillary     Status: Abnormal   Collection Time: 04/20/18  8:23 PM  Result Value Ref Range   Glucose-Capillary 511 (HH) 70 - 99 mg/dL   Comment 1 Notify RN   Glucose, capillary     Status: Abnormal   Collection Time: 04/20/18  9:32 PM  Result Value Ref Range   Glucose-Capillary 459 (H) 70 - 99 mg/dL  Glucose, capillary     Status: Abnormal   Collection Time: 04/20/18 10:48 PM  Result Value Ref Range   Glucose-Capillary 380 (H) 70 - 99 mg/dL  Troponin I - Now  Then Q4H     Status: Abnormal   Collection Time: 04/20/18 11:07 PM  Result Value Ref Range   Troponin I 1.31 (HH) <0.03 ng/mL    Comment: CRITICAL VALUE NOTED. VALUE IS CONSISTENT WITH PREVIOUSLY REPORTED/CALLED VALUE SMA Performed at [facility], [address] [place], [place] [address]   Basic  metabolic panel     Status: Abnormal   Collection Time: 04/20/18 11:07 PM  Result Value Ref Range   Sodium 130 (L) 135 - 145 mmol/L   Potassium 5.2 (H) 3.5 - 5.1 mmol/L   Chloride 92 (L) 98 - 111 mmol/L   CO2 27 22 - 32 mmol/L   Glucose, Bld 344 (H) 70 - 99 mg/dL   BUN 53 (H) 6 - 20 mg/dL   Creatinine, Ser 7.82 (H) 0.44 - 1.00 mg/dL   Calcium 9.4 8.9 - 95.6 mg/dL   GFR calc non Af Amer 56 (L) >60 mL/min   GFR calc Af Amer >60 >60 mL/min   Anion gap 11 5 - 15    Comment: Performed at [facility], [address] [place], [place] [address]  Glucose, capillary     Status: Abnormal   Collection Time: 04/20/18 11:55 PM  Result Value Ref Range   Glucose-Capillary 342 (H) 70 - 99 mg/dL  Glucose, capillary     Status: Abnormal   Collection Time: 04/21/18  1:02 AM  Result Value Ref Range   Glucose-Capillary 258 (H) 70 - 99 mg/dL  Glucose, capillary     Status: Abnormal   Collection Time: 04/21/18  2:12 AM  Result Value Ref Range   Glucose-Capillary 341 (H) 70 - 99 mg/dL  Glucose, capillary     Status: Abnormal   Collection Time: 04/21/18  3:20 AM  Result Value Ref Range   Glucose-Capillary 172 (H) 70 - 99 mg/dL  Troponin I - Now Then Q4H     Status: Abnormal   Collection Time: 04/21/18  3:55 AM  Result Value Ref Range   Troponin I 2.29 (HH) <0.03 ng/mL    Comment: CRITICAL RESULT CALLED TO, READ BACK BY AND VERIFIED WITH RENEE BABB 0457 04/21/2018 SMA Performed at [facility], [address] [place], [place] [address]   Basic metabolic panel     Status: Abnormal   Collection Time: 04/21/18  3:55 AM  Result Value Ref Range   Sodium 132 (L) 135 - 145 mmol/L   Potassium 3.7 3.5 - 5.1 mmol/L   Chloride 97 (L) 98 - 111 mmol/L   CO2 26 22 - 32 mmol/L   Glucose, Bld 119 (H) 70 - 99 mg/dL   BUN 45 (H) 6 - 20 mg/dL   Creatinine, Ser 6.96 0.44 - 1.00 mg/dL   Calcium 9.3 8.9 - 29.5 mg/dL   GFR calc non Af Amer >60 >60 mL/min   GFR calc Af Amer >60 >60 mL/min    Anion gap 9 5 - 15    Comment: Performed at [facility], [address] [place], [place] [address]  CBC     Status: Abnormal   Collection Time: 04/21/18  3:55 AM  Result Value Ref Range   WBC 20.3 (H) 4.0 - 10.5 K/uL   RBC 5.40 (H) 3.87 - 5.11 MIL/uL   Hemoglobin 13.4 12.0 - 15.0 g/dL   HCT 24.4 01.0 - 27.2 %   MCV 76.3 (L) 80.0 - 100.0 fL   MCH 24.8 (L) 26.0 - 34.0 pg   MCHC 32.5 30.0 -  36.0 g/dL   RDW 16.1 (H) 09.6 - 04.5 %   Platelets 330 150 - 400 K/uL   nRBC 0.0 0.0 - 0.2 %    Comment: Performed at [facility], [address] [place], [place] [address]  Protime-INR     Status: Abnormal   Collection Time: 04/21/18  3:55 AM  Result Value Ref Range   Prothrombin Time >90.0 (H) 11.4 - 15.2 seconds   INR >10.0 (HH) 0.8 - 1.2    Comment: CRITICAL RESULT CALLED TO, READ BACK BY AND VERIFIED WITH: RENEE BABB RN [phone] 04/21/2018 HNM (NOTE) INR goal varies based on device and disease states. Performed at [facility], [address] [place], [place] [address]   APTT     Status: Abnormal   Collection Time: 04/21/18  3:55 AM  Result Value Ref Range   aPTT 144 (H) 24 - 36 seconds    Comment:        IF BASELINE aPTT IS ELEVATED, SUGGEST PATIENT RISK ASSESSMENT BE USED TO DETERMINE APPROPRIATE ANTICOAGULANT THERAPY. Performed at [facility], [address] [place], [place] [address]   Glucose, capillary     Status: Abnormal   Collection Time: 04/21/18  4:37 AM  Result Value Ref Range   Glucose-Capillary 118 (H) 70 - 99 mg/dL  Glucose, capillary     Status: Abnormal   Collection Time: 04/21/18  5:48 AM  Result Value Ref Range   Glucose-Capillary 150 (H) 70 - 99 mg/dL  Glucose, capillary     Status: Abnormal   Collection Time: 04/21/18  6:28 AM  Result Value Ref Range   Glucose-Capillary 161 (H) 70 - 99 mg/dL  Glucose, capillary     Status: Abnormal   Collection Time: 04/21/18  7:54 AM  Result Value Ref Range   Glucose-Capillary  171 (H) 70 - 99 mg/dL  Troponin I - Now Then Q6H     Status: Abnormal   Collection Time: 04/21/18  8:38 AM  Result Value Ref Range   Troponin I 2.82 (HH) <0.03 ng/mL    Comment: CRITICAL VALUE NOTED. VALUE IS CONSISTENT WITH PREVIOUSLY REPORTED/CALLED VALUE/HKP/MU Performed at [facility], [address] [place], [place] [address]   Basic metabolic panel     Status: Abnormal   Collection Time: 04/21/18  8:38 AM  Result Value Ref Range   Sodium 131 (L) 135 - 145 mmol/L   Potassium 4.1 3.5 - 5.1 mmol/L   Chloride 95 (L) 98 - 111 mmol/L   CO2 26 22 - 32 mmol/L   Glucose, Bld 194 (H) 70 - 99 mg/dL   BUN 43 (H) 6 - 20 mg/dL   Creatinine, Ser 4.69 0.44 - 1.00 mg/dL   Calcium 9.0 8.9 - 62.9 mg/dL   GFR calc non Af Amer >60 >60 mL/min   GFR calc Af Amer >60 >60 mL/min   Anion gap 10 5 - 15    Comment: Performed at [facility], [address] [place], [place] [address]  Basic metabolic panel     Status: Abnormal   Collection Time: 04/21/18 12:00 PM  Result Value Ref Range   Sodium 131 (L) 135 - 145 mmol/L   Potassium 3.9 3.5 - 5.1 mmol/L   Chloride 93 (L) 98 - 111 mmol/L   CO2 25 22 - 32 mmol/L   Glucose, Bld 315 (H) 70 - 99 mg/dL   BUN 40 (H) 6 - 20 mg/dL   Creatinine, Ser 3.24 0.44 - 1.00  mg/dL   Calcium 9.0 8.9 - 16.1 mg/dL   GFR calc non Af Amer >60 >60 mL/min   GFR calc Af Amer >60 >60 mL/min   Anion gap 13 5 - 15    Comment: Performed at [facility], [address] [place], [place] [address]  Cortisol     Status: None   Collection Time: 04/21/18 12:00 PM  Result Value Ref Range   Cortisol, Plasma 28.8 ug/dL    Comment: (NOTE) AM    6.7 - 22.6 ug/dL PM   <54.0       ug/dL Performed at [facility], [address] [place], [place] [address]   Troponin I - Now Then Q6H     Status: Abnormal   Collection Time: 04/21/18 12:00 PM  Result Value Ref Range   Troponin I 3.16 (HH) <0.03 ng/mL    Comment: CRITICAL VALUE NOTED. VALUE IS  CONSISTENT WITH PREVIOUSLY REPORTED/CALLED VALUE MU/HKP Performed at [facility], [address] [place], [place] [address]   Glucose, capillary     Status: Abnormal   Collection Time: 04/21/18 12:08 PM  Result Value Ref Range   Glucose-Capillary 311 (H) 70 - 99 mg/dL  Basic metabolic panel     Status: Abnormal   Collection Time: 04/21/18  3:34 PM  Result Value Ref Range   Sodium 131 (L) 135 - 145 mmol/L   Potassium 3.7 3.5 - 5.1 mmol/L   Chloride 94 (L) 98 - 111 mmol/L   CO2 27 22 - 32 mmol/L   Glucose, Bld 278 (H) 70 - 99 mg/dL   BUN 40 (H) 6 - 20 mg/dL   Creatinine, Ser 9.56 0.44 - 1.00 mg/dL   Calcium 9.0 8.9 - 21.3 mg/dL   GFR calc non Af Amer >60 >60 mL/min   GFR calc Af Amer >60 >60 mL/min   Anion gap 10 5 - 15    Comment: Performed at [facility], [address] [place], [place] [address]  Protime-INR     Status: Abnormal   Collection Time: 04/21/18  3:34 PM  Result Value Ref Range   Prothrombin Time 20.2 (H) 11.4 - 15.2 seconds   INR 1.8 (H) 0.8 - 1.2    Comment: (NOTE) INR goal varies based on device and disease states. Performed at [facility], [address] [place], [place] [address]   APTT     Status: Abnormal   Collection Time: 04/21/18  3:34 PM  Result Value Ref Range   aPTT 47 (H) 24 - 36 seconds    Comment:        IF BASELINE aPTT IS ELEVATED, SUGGEST PATIENT RISK ASSESSMENT BE USED TO DETERMINE APPROPRIATE ANTICOAGULANT THERAPY. Performed at [facility], [address] [place], [place] [address]   Troponin I - Now Then Q6H     Status: Abnormal   Collection Time: 04/21/18  3:34 PM  Result Value Ref Range   Troponin I 2.04 (HH) <0.03 ng/mL    Comment: CRITICAL VALUE NOTED. VALUE IS CONSISTENT WITH PREVIOUSLY REPORTED/CALLED VALUE KLW Performed at [facility], [address] [place], [place] [address]   Glucose, capillary     Status: Abnormal   Collection Time: 04/21/18  5:58 PM  Result  Value Ref Range   Glucose-Capillary 323 (H) 70 - 99 mg/dL  Glucose, capillary     Status: Abnormal   Collection Time: 04/21/18  8:04 PM  Result Value Ref Range   Glucose-Capillary 287 (  H) 70 - 99 mg/dL  Glucose, capillary     Status: Abnormal   Collection Time: 04/21/18 11:28 PM  Result Value Ref Range   Glucose-Capillary 201 (H) 70 - 99 mg/dL  Troponin I - Now Then Q6H     Status: Abnormal   Collection Time: 04/22/18  2:17 AM  Result Value Ref Range   Troponin I 11.28 (HH) <0.03 ng/mL    Comment: CRITICAL RESULT CALLED TO, READ BACK BY AND VERIFIED WITH TONI WALKER AT 0301 04/22/2018 SMA Performed at [facility], [address] [place], [place] [address]   Heparin level (unfractionated)     Status: Abnormal   Collection Time: 04/22/18  2:17 AM  Result Value Ref Range   Heparin Unfractionated <0.10 (L) 0.30 - 0.70 IU/mL    Comment: (NOTE) If heparin results are below expected values, and patient dosage has  been confirmed, suggest follow up testing of antithrombin III levels. Performed at [facility], [address] [place], [place] [address]   CBC     Status: Abnormal   Collection Time: 04/22/18  2:17 AM  Result Value Ref Range   WBC 21.2 (H) 4.0 - 10.5 K/uL   RBC 5.13 (H) 3.87 - 5.11 MIL/uL   Hemoglobin 12.6 12.0 - 15.0 g/dL   HCT 19.1 47.8 - 29.5 %   MCV 79.1 (L) 80.0 - 100.0 fL   MCH 24.6 (L) 26.0 - 34.0 pg   MCHC 31.0 30.0 - 36.0 g/dL   RDW 62.1 (H) 30.8 - 65.7 %   Platelets 352 150 - 400 K/uL   nRBC 0.0 0.0 - 0.2 %    Comment: Performed at [facility], [address] [place], [place] [address]  Basic metabolic panel     Status: Abnormal   Collection Time: 04/22/18  2:17 AM  Result Value Ref Range   Sodium 136 135 - 145 mmol/L   Potassium 4.6 3.5 - 5.1 mmol/L   Chloride 97 (L) 98 - 111 mmol/L   CO2 29 22 - 32 mmol/L   Glucose, Bld 156 (H) 70 - 99 mg/dL   BUN 44 (H) 6 - 20 mg/dL   Creatinine, Ser 2.95 0.44 - 1.00 mg/dL    Calcium 9.0 8.9 - 28.4 mg/dL   GFR calc non Af Amer >60 >60 mL/min   GFR calc Af Amer >60 >60 mL/min   Anion gap 10 5 - 15    Comment: Performed at [facility], [address] [place], [place] [address]  Magnesium     Status: None   Collection Time: 04/22/18  2:17 AM  Result Value Ref Range   Magnesium 2.1 1.7 - 2.4 mg/dL    Comment: Performed at [facility], [address] [place], [place] [address]  Phosphorus     Status: None   Collection Time: 04/22/18  2:17 AM  Result Value Ref Range   Phosphorus 4.3 2.5 - 4.6 mg/dL    Comment: Performed at [facility], [address] [place], [place] [address]  APTT     Status: Abnormal   Collection Time: 04/22/18  2:17 AM  Result Value Ref Range   aPTT 41 (H) 24 - 36 seconds    Comment:        IF BASELINE aPTT IS ELEVATED, SUGGEST PATIENT RISK ASSESSMENT BE USED TO DETERMINE APPROPRIATE ANTICOAGULANT THERAPY. Performed at [facility], [address], [place], [place] [address]   Glucose, capillary  Status: Abnormal   Collection Time: 04/22/18  3:23 AM  Result Value Ref Range   Glucose-Capillary 134 (H) 70 - 99 mg/dL    Dg Chest Port 1 View  Result Date: 04/22/2018 CLINICAL DATA:  Acute respiratory failure.  History of CHF and COPD. EXAM: PORTABLE CHEST 1 VIEW COMPARISON:  Chest radiograph April 20, 2018 FINDINGS: Cardiac silhouette is mildly enlarged. Coronary artery stent. Calcified aortic arch. Worsening retrocardiac airspace opacity with small LEFT pleural effusion. Persistent small LEFT upper lobe nodular density. New bandlike density RIGHT lung base. Mild chronic interstitial changes. No pneumothorax. Soft tissue planes and included osseous structures are non suspicious. Multiple EKG lines overlie the patient and may obscure subtle underlying pathology. IMPRESSION: 1. Worsening retrocardiac consolidation with small pleural effusion. New RIGHT lung base atelectasis, less likely pneumonia.  Followup PA and lateral chest X-ray is recommended in 3-4 weeks following trial of antibiotic therapy to ensure resolution and exclude underlying malignancy. 2. Stable nodular density LEFT upper lobe. 3. Mild cardiomegaly. Electronically Signed   By: Awilda Metro M.D.   On: 04/22/2018 03:58   Dg Chest Port 1 View  Result Date: 04/20/2018 CLINICAL DATA:  Fever, multiple complaint EXAM: PORTABLE CHEST 1 VIEW COMPARISON:  01/14/2013 FINDINGS: Mild bilateral interstitial thickening. Hazy left lower lobe airspace disease. Small area of airspace disease in the left upper lobe. Possible small left pleural effusion. No right pleural effusion. No pneumothorax. Stable cardiomediastinal silhouette. Thoracic aortic atherosclerosis. No acute osseous abnormality. IMPRESSION: 1. Hazy left lower lobe airspace disease. Small area of airspace disease in the left upper lobe. Findings are concerning for an infectious etiology. Electronically Signed   By: Elige Ko   On: 04/20/2018 15:08   Ct Renal Stone Study  Result Date: 04/20/2018 CLINICAL DATA:  58 year old female with abdominal pain EXAM: CT ABDOMEN AND PELVIS WITHOUT CONTRAST TECHNIQUE: Multidetector CT imaging of the abdomen and pelvis was performed following the standard protocol without IV contrast. COMPARISON:  None. FINDINGS: Lower chest: Respiratory motion limits evaluation. Atelectasis at the left lung base. Hepatobiliary: Cranial caudal span of the right liver measures greater than 18 cm. No focal lesion or nodular contour. Cholecystectomy. No intrahepatic or extrahepatic biliary ductal dilatation. Pancreas: Unremarkable pancreas Spleen: Unremarkable spleen Adrenals/Urinary Tract: Unremarkable adrenal glands. Right kidney demonstrates regions of cortical thinning. No hydronephrosis. Vascular calcifications in the hilum of the right kidney. Unremarkable course of the right ureter. Left kidney demonstrates no hydronephrosis. Likely nonobstructive stone in the  inferior collecting system, versus vascular calcifications. Unremarkable course the left ureter. Urinary bladder partially distended. Stomach/Bowel: Unremarkable stomach. Unremarkable small bowel. Moderate to large formed stool burden. No focal inflammatory changes. Normal appendix. Vascular/Lymphatic: Vascular calcifications of the aorta and bilateral iliac arteries. Venous collaterals on the anterior pelvic wall favored to reflect chronic left iliac main stenosis/occlusion. Reproductive: Unremarkable appearance of uterus. Other: Hazy edema/infiltration of the musculature associated with the left eye fragment a cruise. There are small gas locules present. This is in continuity with the anterior aspect of the L1-L2 disc space, which appears relatively widened compared to adjacent disc spaces. Musculoskeletal: Vacuum disc phenomenon at T12-L1. Compression fracture of L1. Irregularity of the inferior endplate with questionable disc space widening of L1-L2. Ill-defined soft tissue extends from the anterior aspect of the L1-L2 disc space into the left eye frag medic cruise. Degenerative changes of the lower lumbar spine. Vacuum disc phenomenon at L4-L5. IMPRESSION: Soft tissue changes of the left diaphragmatic cruz with small gas locules concerning for infection with  gas-forming organism. This is in continuity with the anterior aspect of the L1-L2 disc space, and the source may be secondary to discitis/osteomyelitis at this site. Evaluation with MRI with contrast may be useful. Compression fracture of L1 of indeterminate age. These results were discussed by telephone at the time of interpretation on 04/20/2018 at 4:10 pm with Dr. Sharma Covert. Aortic Atherosclerosis (ICD10-I70.0). 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 Correlation with a prior history of DVT may be useful. Hepatomegaly. Electronically Signed   By: Gilmer Mor D.O.   On: 04/20/2018 16:14   [place] Ekg Site Rite  Result Date: 04/21/2018 If Site Rite image  not attached, placement could not be confirmed due to current cardiac rhythm.   Review of Systems  Constitutional: Positive for chills, fever, malaise/fatigue and weight loss.  HENT: Positive for congestion.   Eyes: Negative.   Respiratory: Positive for sputum production and wheezing.   Cardiovascular: Positive for palpitations, orthopnea and PND.  Gastrointestinal: Negative.   Genitourinary: Positive for flank pain, frequency and urgency.  Musculoskeletal: Positive for back pain and myalgias.  Skin: Negative.   Neurological: Positive for dizziness.  Endo/Heme/Allergies: Negative.   Psychiatric/Behavioral: Negative.    Blood pressure (!) 104/51, pulse 76, temperature 97.9 F (36.6 C), temperature source Oral, resp. rate 19, height 5\' 3"  (1.6 m), weight 65.2 kg, SpO2 94 %. Physical Exam  Nursing note and vitals reviewed. Constitutional: She is oriented to person, place, and time. She appears well-developed and well-nourished.  HENT:  Head: Normocephalic and atraumatic.  Eyes: Pupils are equal, round, and reactive to light. Conjunctivae and EOM are normal.  Neck: Neck supple.  Cardiovascular: Normal rate and regular rhythm.  Murmur heard. Respiratory: Effort normal and breath sounds normal.  GI: Soft. Bowel sounds are normal.  Musculoskeletal:        General: Deformity present.  Neurological: She is alert and oriented to person, place, and time.  Left BKA  Skin: Skin is warm and dry.  Psychiatric: She has a normal mood and affect.    Assessment/Plan: Sepsis  elevated lactic acid  atrial fibrillation  tachycardia  diskitis osteomyelitis  pneumonia  diabetes  hyponatremia  elevated troponin  acute renal insufficiency  history of DVT .  plan  agree with ICU admission  agreed brought for treatment  Brock spectrum antibiotics for  diskitis osteomyelitis possible sepsis  elevated troponin probably demand ischemia would recommend conservative cardiac therapy  consider  nephrology input for renal insufficiency  continue diabetes management and control  atrial fibrillation with converted to sinus rhythm continue metoprolol for rate Coumadin for anticoagulation  agreed with screening for COVID 19  continue hypertension management with metoprolol which will also help with rate continue to hold losartan because of renal insufficiency  will defer echocardiogram at this point most recent echocardiogram was benign with normal left ventricular function recently  recommend conservative cardiac input  do not recommend invasive strategy at this point   Dwayne D Callwood 04/22/2018, 7:53 AM

## 2018-04-22 NOTE — Progress Notes (Addendum)
Inpatient Diabetes Program Recommendations  AACE/ADA: New Consensus Statement on Inpatient Glycemic Control (2015)  Target Ranges:  Prepandial:   less than 140 mg/dL      Peak postprandial:   less than 180 mg/dL (1-2 hours)      Critically ill patients:  140 - 180 mg/dL   Results for Alexandra Goodman, Alexandra Goodman (MRN [id number]) as of 04/22/2018 08:02  Ref. Range 04/20/2018 23:55 04/21/2018 01:02 04/21/2018 02:12 04/21/2018 03:20 04/21/2018 04:37 04/21/2018 05:48 04/21/2018 06:28 04/21/2018 07:54 04/21/2018 12:08 04/21/2018 17:58 04/21/2018 20:04  Glucose-Capillary Latest Ref Range: 70 - 99 mg/dL 251 (H)  IV Insulin Drip 258 (H)  IV Insulin Drip 341 (H)  IV Insulin Drip 172 (H)  IV Insulin Drip 118 (H)  IV Insulin Drip 150 (H)  IV Insulin Drip 161 (H)  IV Insulin Drip  30 units LANTUS given at 6am 171 (H)  3 units NOVOLOG  311 (H)  11 units NOVOLOG  323 (H)  11 units NOVOLOG  287 (H)  11 units NOVOLOG +  10 units LANTUS   Results for Alexandra Goodman, Alexandra Goodman (MRN [id number]) as of 04/22/2018 08:45  Ref. Range 04/21/2018 23:28 04/22/2018 03:23 04/22/2018 08:04  Glucose-Capillary Latest Ref Range: 70 - 99 mg/dL 281 (H)  7 units NOVOLOG  134 (H)  3 units NOVOLOG  107 (H)    46 units LANTUS     Admit with: Sepsis/ Suspected osteomyelitis/discitis/ Pneumonia/ Rule Out COVID 19/ Hyperglycemia  History: DM, CHF, COPD  Home DM Meds: Lantus 46 units QHS       Metformin 1000 mg BID  Current Orders: Lantus 46 units Daily      Novolog Resistant Correction Scale/ SSI (0-20 units) Q4 hours      Note patient transitioned off the IV Insulin Drip yesterday morning to Lantus and Novolog.  30 units Lantus given at 6am and the Insulin drip was stopped.  An additional 10 units Lantus was given last night at 8pm.  Lantus dose increased to 46 units Daily today.  CBGs decreasing.    MD- Please consider the following in-hospital insulin adjustments:  1. Change/Reduce Novolog SSI to the Moderate scale (0-15  units) TID AC + HS  Now getting PO diet  2. Start Novolog Meal Coverage:  Novolog 4 units TID with meals  (Please add the following Hold Parameters: Hold if pt eats <50% of meal, Hold if pt NPO)       --Will follow patient during hospitalization--  Ambrose Finland RN, MSN, CDE Diabetes Coordinator Inpatient Glycemic Control Team Team Pager: [phone] (8a-5p)

## 2018-04-22 NOTE — Progress Notes (Signed)
Pt is here for sepsis, staph bacteremia, Possible L1 discitis, suspected COVID 19- pending result.  She also have elevated troponin and A fib RVR Seen by cardio- currently on heparine drip, no intervention due to concern regarding COVID19. Rate controlled.  Abx per ID, may need TEE once COVID negative.  Remains in ICU due to high risk for worsening. I have not seen as per current hospital policy regarding COVID suspected pts in ICU

## 2018-04-22 NOTE — Progress Notes (Signed)
CRITICAL CARE NOTE         SUBJECTIVE FINDINGS & SIGNIFICANT EVENTS   Patient clinically improved.  S/p cardio eval for AF/acs - continuing med mgt. , Sepsis bacteremia c/w current plan. She is confused this am - declined from yesterday.  ABG- with significant hypoxemia on 7L Switching to lovenox to free up line access as patient declining peripheral sticks , trop trending.   Family phone conference today -  Daughter spoke to me, reviewed case with daughter including assessment, plan, current condition.  Daughter seemed inebriated during discourse and I have asked her to write down in bullet form our discussion so she could share with other family members as well as read herself once more lucid mental status. She  PAST MEDICAL HISTORY   Past Medical History:  Diagnosis Date   Asthma    CHF (congestive heart failure) (HCC)    Chronic pain    COPD (chronic obstructive pulmonary disease) (HCC)    Diabetes mellitus (HCC)    Peripheral vascular disease (HCC)    Stroke (cerebrum) (HCC)      SURGICAL HISTORY   Past Surgical History:  Procedure Laterality Date   below the knee LLE amputation Left      FAMILY HISTORY   Family History  Problem Relation Age of Onset   Heart disease Mother    Heart disease Father      SOCIAL HISTORY   Social History   Tobacco Use   Smoking status: Current Every Day Smoker    Packs/day: 2.00    Years: 45.00    Pack years: 90.00    Types: Cigarettes   Smokeless tobacco: Never Used  Substance Use Topics   Alcohol use: Not Currently   Drug use: Never     MEDICATIONS   Current Medication:  Current Facility-Administered Medications:    acetaminophen (TYLENOL) tablet 650 mg, 650 mg, Oral, Q6H PRN **OR** acetaminophen (TYLENOL) suppository 650  mg, 650 mg, Rectal, Q6H PRN, Sainani, Vivek J, MD   albuterol (PROVENTIL HFA;VENTOLIN HFA) 108 (90 Base) MCG/ACT inhaler 2 puff, 2 puff, Inhalation, Q4H PRN, Sainani, Rolly PancakeVivek J, MD   aspirin EC tablet 81 mg, 81 mg, Oral, Daily, Mossie Gilder, MD, 81 mg at 04/22/18 0812   atorvastatin (LIPITOR) tablet 80 mg, 80 mg, Oral, Q2000, Houston SirenSainani, Vivek J, MD, 80 mg at 04/21/18 2007   clopidogrel (PLAVIX) tablet 75 mg, 75 mg, Oral, Daily, [place] SirenSainani, Vivek J, MD, 75 mg at 04/22/18 0815   famotidine (  PEPCID) tablet 40 mg, 40 mg, Oral, QPM, Sainani, Rolly Pancake, MD, 40 mg at 04/21/18 2007   FLUoxetine (PROZAC) capsule 20 mg, 20 mg, Oral, Daily, Sainani, Vivek J, MD, 20 mg at 04/22/18 0813   heparin ADULT infusion 100 units/mL (25000 units/240mL sodium chloride 0.45%), 950 Units/hr, Intravenous, Continuous, Hallaji, Sheema M, RPH, Last Rate: 9.5 mL/hr at 04/22/18 0600, 950 Units/hr at 04/22/18 0600   insulin aspart (novoLOG) injection 0-20 Units, 0-20 Units, Subcutaneous, Q4H, Harlon Ditty D, NP, 3 Units at 04/22/18 0326   insulin glargine (LANTUS) injection 46 Units, 46 Units, Subcutaneous, Daily, Harlon Ditty D, NP, 46 Units at 04/22/18 0807   levothyroxine (SYNTHROID, LEVOTHROID) tablet 88 mcg, 88 mcg, Oral, Q0600, Houston Siren, MD, 88 mcg at 04/22/18 8295   MEDLINE mouth rinse, 15 mL, Mouth Rinse, BID, Karna Christmas, Ltanya Bayley, MD   metoprolol succinate (TOPROL-XL) 24 hr tablet 100 mg, 100 mg, Oral, Daily, Sainani, Vivek J, MD, 100 mg at 04/21/18 6213   mirabegron ER (MYRBETRIQ) tablet 25 mg, 25 mg, Oral, Daily, Sainani, Vivek J, MD, 25 mg at 04/22/18 0813   mometasone-formoterol (DULERA) 200-5 MCG/ACT inhaler 2 puff, 2 puff, Inhalation, BID, Houston Siren, MD, 2 puff at 04/22/18 0824   montelukast (SINGULAIR) tablet 10 mg, 10 mg, Oral, Daily, Houston Siren, MD, 10 mg at 04/22/18 0815   morphine (MS CONTIN) 12 hr tablet 30 mg, 30 mg, Oral, Q12H, Sainani, Rolly Pancake, MD, 30 mg at 04/22/18 0811    nafcillin 2 g in sodium chloride 0.9 % 100 mL IVPB, 2 g, Intravenous, Q4H, Ravishankar, Rhodia Albright, MD, Last Rate: 200 mL/hr at 04/22/18 0748, 2 g at 04/22/18 0748   ondansetron (ZOFRAN) tablet 4 mg, 4 mg, Oral, Q6H PRN **OR** ondansetron (ZOFRAN) injection 4 mg, 4 mg, Intravenous, Q6H PRN, Cherlynn Kaiser, Rolly Pancake, MD   oxyCODONE-acetaminophen (PERCOCET/ROXICET) 5-325 MG per tablet 1 tablet, 1 tablet, Oral, 5 X Daily, 1 tablet at 04/22/18 0323 **AND** oxyCODONE (Oxy IR/ROXICODONE) immediate release tablet 5 mg, 5 mg, Oral, 5 X Daily, Sainani, Vivek J, MD, 5 mg at 04/22/18 0323   phenylephrine (NEO-SYNEPHRINE) 40 mg in sodium chloride 0.9 % 250 mL (0.16 mg/mL) infusion, 0-400 mcg/min, Intravenous, Titrated, Harlon Ditty D, NP, Last Rate: 9.38 mL/hr at 04/22/18 0503, 25 mcg/min at 04/22/18 0503   sodium chloride flush (NS) 0.9 % injection 10-40 mL, 10-40 mL, Intracatheter, Q12H, Altamese Dilling, MD, 40 mL at 04/21/18 2341   sodium chloride flush (NS) 0.9 % injection 10-40 mL, 10-40 mL, Intracatheter, PRN, Altamese Dilling, MD   tiotropium (SPIRIVA) inhalation capsule ([facility] use ONLY) 18 mcg, 1 capsule, Inhalation, Daily, Houston Siren, MD, 18 mcg at 04/22/18 0818   traZODone (DESYREL) tablet 150 mg, 150 mg, Oral, QHS, Sainani, Vivek J, MD, 150 mg at 04/20/18 2256    ALLERGIES   Skin protectants, misc.; Flagyl [metronidazole]; Gabapentin; Levetiracetam; Tape; Ace inhibitors; Ertapenem; and Sertraline hcl    REVIEW OF SYSTEMS   10 system ROS not conducted due to coronavirus precautions  PHYSICAL EXAMINATION   Vitals:   04/22/18 0630 04/22/18 0645  BP: (!) 101/51 (!) 104/51  Pulse: 74 76  Resp: (!) 23 19  Temp:    SpO2: 100% 94%    GENERAL:aggitation  HEAD: Normocephalic, atraumatic.  EYES: Pupils equal, round, reactive to light.  No scleral icterus.  MOUTH: Moist mucosal membrane. NECK: Supple. No thyromegaly. No nodules. No JVD.  PULMONARY: b/l rhonchorous breath  sounds  CARDIOVASCULAR: S1 and S2. Regular rate and  rhythm. No murmurs, rubs, or gallops.  GASTROINTESTINAL: Soft, nontender, non-distended. No masses. Positive bowel sounds. No hepatosplenomegaly.  MUSCULOSKELETAL: No swelling, clubbing, or edema.  NEUROLOGIC: Mild distress due to acute illness SKIN:intact,warm,dry   LABS AND IMAGING     LAB RESULTS: Recent Labs  Lab 04/21/18 1200 04/21/18 1534 04/22/18 0217  NA 131* 131* 136  K 3.9 3.7 4.6  CL 93* 94* 97*  CO2 25 27 29   BUN 40* 40* 44*  CREATININE 0.85 0.72 0.87  GLUCOSE 315* 278* 156*   Recent Labs  Lab 04/20/18 1429 04/21/18 0355 04/22/18 0217  HGB 13.0 13.4 12.6  HCT 39.8 41.2 40.6  WBC 22.0* 20.3* 21.2*  PLT 320 330 352     IMAGING RESULTS: Dg Chest Port 1 View  Result Date: 04/22/2018 CLINICAL DATA:  Acute respiratory failure.  History of CHF and COPD. EXAM: PORTABLE CHEST 1 VIEW COMPARISON:  Chest radiograph April 20, 2018 FINDINGS: Cardiac silhouette is mildly enlarged. Coronary artery stent. Calcified aortic arch. Worsening retrocardiac airspace opacity with small LEFT pleural effusion. Persistent small LEFT upper lobe nodular density. New bandlike density RIGHT lung base. Mild chronic interstitial changes. No pneumothorax. Soft tissue planes and included osseous structures are non suspicious. Multiple EKG lines overlie the patient and may obscure subtle underlying pathology. IMPRESSION: 1. Worsening retrocardiac consolidation with small pleural effusion. New RIGHT lung base atelectasis, less likely pneumonia. Followup PA and lateral chest X-ray is recommended in 3-4 weeks following trial of antibiotic therapy to ensure resolution and exclude underlying malignancy. 2. Stable nodular density LEFT upper lobe. 3. Mild cardiomegaly. Electronically Signed   By: Awilda Metro M.D.   On: 04/22/2018 03:58   [place] Ekg Site Rite  Result Date: 04/21/2018 If Site Rite image not attached, placement could not be confirmed due  to current cardiac rhythm.     ASSESSMENT AND PLAN   Acuteon chronic hypoxic Respiratory Failure -Rule out novel coronavirus infection -Background history of CHF and COPD- no signs of pulmonary edema on CT or x-ray, no clinical signs of acute COPD exacerbation at this time - clinically improved but aggitated this am    CARDIO     -AFrvr- resolved - rate/rythm contorlled - stopped cardizem, placed on metoprolol yesterday due to ACS -                      no chest pain however pt with DM, critically ill    - EKG without STelevations or new LBBB -    - initiating modified acs protocol for now    - cardiology on case - appreciate recommendations - Dr Lady Gary - med management for now     Mixed acid base disorder with highanion gap metabolic acidosis and metabolic alkalosis-resolved -Complicated by pseudohyponatremia,hypoalbuminemia with hyporchloremia -Likely due to DKA and sepsis -Urine ketones pending-initiating phase 1 DKA protocol    Atrial fibrillation with rapid ventricular response-resolved -Cardizem gtt.stopped now on PO -oxygen as needed -follow up cardiac enzymes as indicated-troponin trending up Cardiology consultation-appreciate recommendations ICU monitoring    Moderate protein calorie malnutrition-albumin 2.1 -Dietary consultation-appreciate recommendation    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  -hx of bilateral pulmonary nodule with hilar and mediastinal lymphadenopathy - hold bronchodilator therapy for now as patient is in AFrVR - outpatient appt made with [facility] clinic pulmonology -incentive spirometry and flutter valve for BPH   Acute kidney Injury- KDIGO stage I -most likely due topre-renal azotemia vs ATN -Resuscitative fluids- 20cc/kg due to  her CHF status -follow chem 7 -follow UO -continue Foley  Catheter-assess need daily   Sepsis -Due to staph aureus - bacteremia -follow ABG and LA -follow up cultures -emperic ABX -consider stress dose steroids   ID -continue IV abx as prescibed -follow up cultures  GI/Nutrition GI PROPHYLAXIS as indicated DIET-->TF's as tolerated Constipation protocol as indicated  ENDO - ICU hypoglycemic\Hyperglycemia protocol -check FSBS per protocol   ELECTROLYTES -follow labs as needed -replace as needed -pharmacy consultation   DVT/GI PRX ordered -SCDs  TRANSFUSIONS AS NEEDED MONITOR FSBS ASSESS the need for LABS as needed   This document was prepared using Dragon voice recognition software and may include unintentional dictation errors.    Vida Rigger, M.D.  Division of Pulmonary & Critical Care Medicine  [facility]

## 2018-04-22 NOTE — Progress Notes (Signed)
Patient Name: Alexandra Goodman Date of Encounter: 04/22/2018  Hospital Problem List     Active Problems:   Sepsis [facility])    Patient Profile     58 year old female with history of congestive heart failure, chronic pain, COPD, diabetes, peripheral vascular disease with a DVT and CVA who was admitted after presenting to the ER with probable urinary tract infection.  Patient has been complaining of lower back pain and urinary frequency.  Urinalysis was positive for staph aureus.  She was advised to come to the ER.  He had abdominal pain but no chest pain no nausea vomiting or shortness of breath.  She had flank pain.  She had shortness of breath which was chronic.  She had recent travel history including [place] and in the last month for a funeral.  In the emergency room she was noted to have mild acute on chronic renal insufficiency with severe hyperglycemia with leukocytosis and elevated lactic acid consistent with sepsis.  She underwent a CT showing no nephrolithiasis but possible discitis/osteomyelitis in the L1-L2 region.  Initial troponin was 0.99. Novel viral test is still pending.  She has no chest pain.  She developed atrial fibrillation with rapid ventricular response.  She remains on a heparin drip and aspirin and Plavix.  She was on metoprolol succinate at 100 mg daily.  She was given vitamin K IV after her initial INR greater than 10 was noted..  There was no bleeding.  She was placed on empiric antibiotic therapy including Zosyn and vancomycin.  Blood cultures were positive for MSSA.  She is denying chest pain.  Troponins were continued to be drawn.  Most recent troponin was 11.28.  Chest x-ray this morning revealed worsening retrocardiac consolidation with a small pleural effusion.  Patient has history of coronary disease status post PCI.  Echo in 2014 showed EF of 50 to 55%.  Echo from this visit still pending.  Patient was given IV diltiazem for rate control.  Subjective   Flank pain  but no chest pain.  Inpatient Medications    . aspirin EC  81 mg Oral Daily  . atorvastatin  80 mg Oral Q2000  . clopidogrel  75 mg Oral Daily  . famotidine  40 mg Oral QPM  . FLUoxetine  20 mg Oral Daily  . insulin aspart  0-20 Units Subcutaneous Q4H  . insulin glargine  46 Units Subcutaneous Daily  . levothyroxine  88 mcg Oral Q0600  . mouth rinse  15 mL Mouth Rinse BID  . metoprolol succinate  100 mg Oral Daily  . mirabegron ER  25 mg Oral Daily  . mometasone-formoterol  2 puff Inhalation BID  . montelukast  10 mg Oral Daily  . morphine  30 mg Oral Q12H  . oxyCODONE-acetaminophen  1 tablet Oral 5 X Daily   And  . oxyCODONE  5 mg Oral 5 X Daily  . sodium chloride flush  10-40 mL Intracatheter Q12H  . tiotropium  1 capsule Inhalation Daily  . traZODone  150 mg Oral QHS    Vital Signs    Vitals:   04/22/18 0600 04/22/18 0615 04/22/18 0630 04/22/18 0645  BP: (!) 92/49 (!) 99/49 (!) 101/51 (!) 104/51  Pulse: 77 76 74 76  Resp: (!) 21 (!) 21 (!) 23 19  Temp:      TempSrc:      SpO2: (!) 88% 94% 100% 94%  Weight:      Height:  Intake/Output Summary (Last 24 hours) at 04/22/2018 0804 Last data filed at 04/22/2018 0600 Gross per 24 hour  Intake 2508.73 ml  Output 500 ml  Net 2008.73 ml   Filed Weights   04/20/18 1430 04/20/18 1900  Weight: 71.2 kg 65.2 kg    Physical Exam    GEN: Well nourished, well developed, in no acute distress.  HEENT: normal.  Neck: Supple, no JVD, carotid bruits, or masses. Cardiac: RRR, no murmurs, rubs, or gallops. No clubbing, cyanosis, edema.  Radials/DP/PT 2+ and equal bilaterally.  Respiratory:  Respirations regular and unlabored, clear to auscultation bilaterally. GI: Soft, nontender, nondistended, BS + x 4. MS: no deformity or atrophy. Skin: warm and dry, no rash. Neuro:  Strength and sensation are intact. Psych: Normal affect.  Labs    CBC Recent Labs    04/20/18 1429 04/21/18 0355 04/22/18 0217  WBC 22.0* 20.3*  21.2*  NEUTROABS 20.1*  --   --   HGB 13.0 13.4 12.6  HCT 39.8 41.2 40.6  MCV 76.8* 76.3* 79.1*  PLT 320 330 352   Basic Metabolic Panel Recent Labs    09/81/1902/03/10 1534 04/22/18 0217  NA 131* 136  K 3.7 4.6  CL 94* 97*  CO2 27 29  GLUCOSE 278* 156*  BUN 40* 44*  CREATININE 0.72 0.87  CALCIUM 9.0 9.0  MG  --  2.1  PHOS  --  4.3   Liver Function Tests Recent Labs    04/20/18 1429  AST 21  ALT 22  ALKPHOS 152*  BILITOT 0.7  PROT 6.1*  ALBUMIN 2.1*   No results for input(s): LIPASE, AMYLASE in the last 72 hours. Cardiac Enzymes Recent Labs    04/21/18 1200 04/21/18 1534 04/22/18 0217  TROPONINI 3.16* 2.04* 11.28*   BNP Recent Labs    04/20/18 1939  BNP 610.0*   D-Dimer No results for input(s): DDIMER in the last 72 hours. Hemoglobin A1C Recent Labs    04/20/18 1939  HGBA1C 9.6*   Fasting Lipid Panel No results for input(s): CHOL, HDL, LDLCALC, TRIG, CHOLHDL, LDLDIRECT in the last 72 hours. Thyroid Function Tests No results for input(s): TSH, T4TOTAL, T3FREE, THYROIDAB in the last 72 hours.  Invalid input(s): FREET3  Telemetry    Normal sinus rhythm  ECG    Normal sinus rhythm with nonspecific ST-T wave changes.  Radiology    Dg Chest Port 1 View  Result Date: 04/22/2018 CLINICAL DATA:  Acute respiratory failure.  History of CHF and COPD. EXAM: PORTABLE CHEST 1 VIEW COMPARISON:  Chest radiograph April 20, 2018 FINDINGS: Cardiac silhouette is mildly enlarged. Coronary artery stent. Calcified aortic arch. Worsening retrocardiac airspace opacity with small LEFT pleural effusion. Persistent small LEFT upper lobe nodular density. New bandlike density RIGHT lung base. Mild chronic interstitial changes. No pneumothorax. Soft tissue planes and included osseous structures are non suspicious. Multiple EKG lines overlie the patient and may obscure subtle underlying pathology. IMPRESSION: 1. Worsening retrocardiac consolidation with small pleural effusion. New  RIGHT lung base atelectasis, less likely pneumonia. Followup PA and lateral chest X-ray is recommended in 3-4 weeks following trial of antibiotic therapy to ensure resolution and exclude underlying malignancy. 2. Stable nodular density LEFT upper lobe. 3. Mild cardiomegaly. Electronically Signed   By: Awilda Metroourtnay  Bloomer M.D.   On: 04/22/2018 03:58   Dg Chest Port 1 View  Result Date: 04/20/2018 CLINICAL DATA:  Fever, multiple complaint EXAM: PORTABLE CHEST 1 VIEW COMPARISON:  01/14/2013 FINDINGS: Mild bilateral interstitial thickening. Hazy left lower  lobe airspace disease. Small area of airspace disease in the left upper lobe. Possible small left pleural effusion. No right pleural effusion. No pneumothorax. Stable cardiomediastinal silhouette. Thoracic aortic atherosclerosis. No acute osseous abnormality. IMPRESSION: 1. Hazy left lower lobe airspace disease. Small area of airspace disease in the left upper lobe. Findings are concerning for an infectious etiology. Electronically Signed   By: Elige Ko   On: 04/20/2018 15:08   Ct Renal Stone Study  Result Date: 04/20/2018 CLINICAL DATA:  58 year old female with abdominal pain EXAM: CT ABDOMEN AND PELVIS WITHOUT CONTRAST TECHNIQUE: Multidetector CT imaging of the abdomen and pelvis was performed following the standard protocol without IV contrast. COMPARISON:  None. FINDINGS: Lower chest: Respiratory motion limits evaluation. Atelectasis at the left lung base. Hepatobiliary: Cranial caudal span of the right liver measures greater than 18 cm. No focal lesion or nodular contour. Cholecystectomy. No intrahepatic or extrahepatic biliary ductal dilatation. Pancreas: Unremarkable pancreas Spleen: Unremarkable spleen Adrenals/Urinary Tract: Unremarkable adrenal glands. Right kidney demonstrates regions of cortical thinning. No hydronephrosis. Vascular calcifications in the hilum of the right kidney. Unremarkable course of the right ureter. Left kidney demonstrates no  hydronephrosis. Likely nonobstructive stone in the inferior collecting system, versus vascular calcifications. Unremarkable course the left ureter. Urinary bladder partially distended. Stomach/Bowel: Unremarkable stomach. Unremarkable small bowel. Moderate to large formed stool burden. No focal inflammatory changes. Normal appendix. Vascular/Lymphatic: Vascular calcifications of the aorta and bilateral iliac arteries. Venous collaterals on the anterior pelvic wall favored to reflect chronic left iliac main stenosis/occlusion. Reproductive: Unremarkable appearance of uterus. Other: Hazy edema/infiltration of the musculature associated with the left eye fragment a cruise. There are small gas locules present. This is in continuity with the anterior aspect of the L1-L2 disc space, which appears relatively widened compared to adjacent disc spaces. Musculoskeletal: Vacuum disc phenomenon at T12-L1. Compression fracture of L1. Irregularity of the inferior endplate with questionable disc space widening of L1-L2. Ill-defined soft tissue extends from the anterior aspect of the L1-L2 disc space into the left eye frag medic cruise. Degenerative changes of the lower lumbar spine. Vacuum disc phenomenon at L4-L5. IMPRESSION: Soft tissue changes of the left diaphragmatic cruz with small gas locules concerning for infection with gas-forming organism. This is in continuity with the anterior aspect of the L1-L2 disc space, and the source may be secondary to discitis/osteomyelitis at this site. Evaluation with MRI with contrast may be useful. Compression fracture of L1 of indeterminate age. These results were discussed by telephone at the time of interpretation on 04/20/2018 at 4:10 pm with Dr. Sharma Covert. Aortic Atherosclerosis (ICD10-I70.0). 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 Correlation with a prior history of DVT may be useful. Hepatomegaly. Electronically Signed   By: Gilmer Mor D.O.   On: 04/20/2018 16:14   [place] Ekg  Site Rite  Result Date: 04/21/2018 If Site Rite image not attached, placement could not be confirmed due to current cardiac rhythm.   Assessment & Plan    Patient with history of multiple medical problems including PCI in the past admitted with sepsis.  He developed A. fib with rapid ventricular response.  She was given IV Cardizem and is converted back to sinus rhythm currently on metoprolol.  Holoman has increased to 11.  Patient has no chest pain or ischemic changes on her electrocardiogram.  Her chest x-ray shows somewhat worsening retrocardiac infiltrate.  She again is not complaint of chest pain.  Her covid test is still pending.  Echo is pending.  Will continue  with heparin.  Her INR was reversed as it was greater than 10 on presentation.  She is on antibiotics for MSSA staph aureus bacteremia.  Will attempt medical management at present given multiple medical problems.  Will follow along with you.  Signed, Darlin Priestly Cydney Alvarenga MD 04/22/2018, 8:04 AM  Pager: [phone]

## 2018-04-22 NOTE — TOC Initial Note (Signed)
Transition of Care [facility]) - Initial/Assessment Note    Patient Details  Name: Alexandra Goodman MRN: [id number] Date of Birth: September 25, 1960  Transition of Care [facility]) CM/SW Contact:    Allayne Butcher, RN Phone Number: 04/22/2018, 11:42 AM  Clinical Narrative:                 Patient is from home and lives with her long time boyfriend [employer].  Patient is on home O2 and still smokes.  Patient is also medically non compliant per her family members.  Patient is currently in the ICU very confused and refusing lab draws.  RNCM will cont to follow patient and assist with discharge planning.  Patient will likely need SNF placement.   Expected Discharge Plan: Skilled Nursing Facility Barriers to Discharge: Continued Medical Work up   Patient Goals and CMS Choice        Expected Discharge Plan and Services Expected Discharge Plan: Skilled Nursing Facility   Discharge Planning Services: CM Consult   Living arrangements for the past 2 months: Mobile Home                          Prior Living Arrangements/Services Living arrangements for the past 2 months: Mobile Home Lives with:: Significant Other Patient language and need for interpreter reviewed:: Yes        Need for Family Participation in Patient Care: Yes (Comment)(chronically ill with comorbidities) Care giver support system in place?: Yes (comment)(boyfriend) Current home services: DME(home O2) Criminal Activity/Legal Involvement Pertinent to Current Situation/Hospitalization: No - Comment as needed  Activities of Daily Living      Permission Sought/Granted                  Emotional Assessment Appearance:: Appears older than stated age, Disheveled Attitude/Demeanor/Rapport: Uncooperative Affect (typically observed): Restless, Inappropriate Orientation: : Oriented to Self Alcohol / Substance Use: Tobacco Use Psych Involvement: No (comment)  Admission diagnosis:  Lactic acidosis [E87.2] Hyperglycemia  [R73.9] Community acquired pneumonia of left lower lobe of lung (HCC) [J18.1] Sepsis (HCC) [A41.9] Patient Active Problem List   Diagnosis Date Noted  . Sepsis (HCC) 04/20/2018   PCP:  Floreen Comber, MD Pharmacy:   [facility] [phone] - [place], [place] - [address] [place] [address] Phone: [phone] Fax: [phone]     Social Determinants of Health (SDOH) Interventions    Readmission Risk Interventions No flowsheet data found.
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
## 2018-04-23 LAB — BASIC METABOLIC PANEL
Anion gap: 10 (ref 5–15)
BUN: 33 mg/dL — ABNORMAL HIGH (ref 6–20)
CO2: 27 mmol/L (ref 22–32)
Calcium: 8.3 mg/dL — ABNORMAL LOW (ref 8.9–10.3)
Chloride: 104 mmol/L (ref 98–111)
Creatinine, Ser: 0.71 mg/dL (ref 0.44–1.00)
GFR calc Af Amer: >60 mL/min (ref >60)
GFR calc non Af Amer: >60 mL/min (ref >60)
Glucose, Bld: 72 mg/dL (ref 70–99)
Potassium: 3.4 mmol/L — ABNORMAL LOW (ref 3.5–5.1)
Sodium: 141 mmol/L (ref 135–145)

## 2018-04-23 LAB — TROPONIN I: Troponin I: 22.8 ng/mL (ref <0.03)

## 2018-04-23 LAB — CBC
HCT: 39.4 % (ref 36.0–46.0)
Hemoglobin: 12.4 g/dL (ref 12.0–15.0)
MCH: 24.3 pg — ABNORMAL LOW (ref 26.0–34.0)
MCHC: 31.5 g/dL (ref 30.0–36.0)
MCV: 77.1 fL — ABNORMAL LOW (ref 80.0–100.0)
Platelets: 400 10*3/uL (ref 150–400)
RBC: 5.11 MIL/uL (ref 3.87–5.11)
RDW: 18.8 % — ABNORMAL HIGH (ref 11.5–15.5)
WBC: 20.7 10*3/uL — ABNORMAL HIGH (ref 4.0–10.5)
nRBC: 0 % (ref 0.0–0.2)

## 2018-04-23 LAB — CULTURE, BLOOD (ROUTINE X 2): Special Requests: ADEQUATE

## 2018-04-23 LAB — GLUCOSE, CAPILLARY
Glucose-Capillary: 108 mg/dL — ABNORMAL HIGH (ref 70–99)
Glucose-Capillary: 231 mg/dL — ABNORMAL HIGH (ref 70–99)
Glucose-Capillary: 277 mg/dL — ABNORMAL HIGH (ref 70–99)
Glucose-Capillary: 290 mg/dL — ABNORMAL HIGH (ref 70–99)
Glucose-Capillary: 358 mg/dL — ABNORMAL HIGH (ref 70–99)
Glucose-Capillary: 81 mg/dL (ref 70–99)
Glucose-Capillary: 81 mg/dL (ref 70–99)

## 2018-04-23 LAB — NOVEL CORONAVIRUS, NAA (HOSP ORDER, SEND-OUT TO REF LAB; TAT 18-24 HRS): SARS-CoV-2, NAA: NOT DETECTED

## 2018-04-23 LAB — MAGNESIUM: Magnesium: 2 mg/dL (ref 1.7–2.4)

## 2018-04-23 MED ORDER — SODIUM CHLORIDE 0.9 % IV BOLUS
250.0000 mL | Freq: Once | INTRAVENOUS | Status: AC
  Administered 2018-04-23: 06:00:00 250 mL via INTRAVENOUS

## 2018-04-23 MED ORDER — INSULIN ASPART 100 UNIT/ML ~~LOC~~ SOLN
0.0000 [IU] | SUBCUTANEOUS | Status: DC
  Administered 2018-04-23: 17:00:00 5 [IU] via SUBCUTANEOUS
  Administered 2018-04-23: 12:00:00 8 [IU] via SUBCUTANEOUS
  Administered 2018-04-23: 22:00:00 15 [IU] via SUBCUTANEOUS
  Administered 2018-04-24: 8 [IU] via SUBCUTANEOUS
  Filled 2018-04-23 (×4): qty 1

## 2018-04-23 MED ORDER — HYDRALAZINE HCL 20 MG/ML IJ SOLN: 10.0000 mg | INTRAMUSCULAR | Status: DC | PRN

## 2018-04-23 MED ORDER — AMIODARONE HCL IN DEXTROSE 360-4.14 MG/200ML-% IV SOLN
60.0000 mg/h | INTRAVENOUS | Status: DC
  Administered 2018-04-23: 05:00:00 60 mg/h via INTRAVENOUS
  Filled 2018-04-23 (×2): qty 200

## 2018-04-23 MED ORDER — AMIODARONE IV BOLUS ONLY 150 MG/100ML
INTRAVENOUS | Status: AC
  Administered 2018-04-23: 150 mg
  Filled 2018-04-23: qty 100

## 2018-04-23 MED ORDER — POTASSIUM CHLORIDE 10 MEQ/50ML IV SOLN
10.0000 meq | INTRAVENOUS | Status: AC
  Administered 2018-04-23 (×2): 10 meq via INTRAVENOUS
  Filled 2018-04-23 (×2): qty 50

## 2018-04-23 MED ORDER — AMIODARONE HCL IN DEXTROSE 360-4.14 MG/200ML-% IV SOLN
30.0000 mg/h | INTRAVENOUS | Status: DC
  Administered 2018-04-23 (×2): 30 mg/h via INTRAVENOUS
  Filled 2018-04-23: qty 200

## 2018-04-23 MED ORDER — AMIODARONE LOAD VIA INFUSION
150.0000 mg | Freq: Once | INTRAVENOUS | Status: DC
  Filled 2018-04-23: qty 83.34

## 2018-04-23 NOTE — Progress Notes (Signed)
[employer] - [place] at [facility]                                                                                                                                                                                  Patient Demographics   Alexandra Goodman, is a 58 y.o. female, DOB - 1960/06/25, ZOX:[id number]  Admit date - 04/20/2018   Admitting Physician Houston Siren, MD  Outpatient Primary MD for the patient is Kimel-Scott, Clydie Braun, MD   LOS - 3  Subjective: Patient was confused and agitated yesterday now doing better    Review of Systems:   CONSTITUTIONAL: No documented fever. No fatigue, weakness. No weight gain, no weight loss.  EYES: No blurry or double vision.  ENT: No tinnitus. No postnasal drip. No redness of the oropharynx.  RESPIRATORY: No cough, no wheeze, no hemoptysis. No dyspnea.  CARDIOVASCULAR: No chest pain. No orthopnea. No palpitations. No syncope.  GASTROINTESTINAL: No nausea, no vomiting or diarrhea. No abdominal pain. No melena or hematochezia.  GENITOURINARY: No dysuria or hematuria.  ENDOCRINE: No polyuria or nocturia. No heat or cold intolerance.  HEMATOLOGY: No anemia. No bruising. No bleeding.  INTEGUMENTARY: No rashes. No lesions.  MUSCULOSKELETAL: No arthritis. No swelling. No gout.  NEUROLOGIC: No numbness, tingling, or ataxia. No seizure-type activity.  PSYCHIATRIC: No anxiety. No insomnia. No ADD.    Vitals:   Vitals:   04/23/18 0700 04/23/18 0800 04/23/18 0900 04/23/18 1000  BP: 101/61 103/69  (!) 86/58  Pulse: (!) 102 94    Resp: Temp: 98.3 F (36.8 C)     TempSrc:      SpO2: 93% 95%    Weight:      Height:        Wt Readings from Last 3 Encounters:  04/20/18 65.2 kg     Intake/Output Summary (Last 24 hours) at 04/23/2018 1112 Last data filed at 04/23/2018 0600 Gross per 24 hour  Intake 844.12 ml  Output 600 ml  Net 244.12 ml    Physical Exam:   GENERAL: Pleasant-appearing in no apparent distress.   HEAD, EYES, EARS, NOSE AND THROAT: Atraumatic, normocephalic. Extraocular muscles are intact. Pupils equal and reactive to light. Sclerae anicteric. No conjunctival injection. No oro-pharyngeal erythema.  NECK: Supple. There is no jugular venous distention. No bruits, no lymphadenopathy, no thyromegaly.  HEART: Regular rate and rhythm,. No murmurs, no rubs, no clicks.  LUNGS: Clear to auscultation bilaterally. No rales or rhonchi. No wheezes.  ABDOMEN: Soft, flat, nontender, nondistended. Has good bowel sounds. No hepatosplenomegaly appreciated.  EXTREMITIES: No evidence of any cyanosis, clubbing, or peripheral edema.  +2 pedal and radial pulses  bilaterally.  NEUROLOGIC: The patient is alert, awake, and oriented to person SKIN: Moist and warm with no rashes appreciated.  Psych: Not anxious, depressed LN: No inguinal LN enlargement    Antibiotics   Anti-infectives (From admission, onward)   Start     Dose/Rate Route Frequency Ordered Stop   04/21/18 2000  vancomycin (VANCOCIN) IVPB 1000 mg/200 mL premix  Status:  Discontinued     1,000 mg 200 mL/hr over 60 Minutes Intravenous Every 24 hours 04/20/18 1646 04/21/18 0249   04/21/18 2000  nafcillin 2 g in sodium chloride 0.9 % 100 mL IVPB     2 g 200 mL/hr over 30 Minutes Intravenous Every 4 hours 04/21/18 1835     04/21/18 0600  ceFAZolin (ANCEF) IVPB 2g/100 mL premix  Status:  Discontinued     2 g 200 mL/hr over 30 Minutes Intravenous Every 8 hours 04/21/18 0251 04/21/18 1835   04/20/18 2300  piperacillin-tazobactam (ZOSYN) IVPB 3.375 g  Status:  Discontinued     3.375 g 12.5 mL/hr over 240 Minutes Intravenous Every 8 hours 04/20/18 1646 04/21/18 0249   04/20/18 2000  vancomycin (VANCOCIN) IVPB 750 mg/150 ml premix     750 mg 150 mL/hr over 60 Minutes Intravenous  Once 04/20/18 1646 04/20/18 2255   04/20/18 1500  vancomycin (VANCOCIN) IVPB 1000 mg/200 mL premix     1,000 mg 200 mL/hr over 60 Minutes Intravenous  Once 04/20/18 1457  04/20/18 1627   04/20/18 1500  piperacillin-tazobactam (ZOSYN) IVPB 3.375 g     3.375 g 100 mL/hr over 30 Minutes Intravenous  Once 04/20/18 1457 04/20/18 1559      Medications   Scheduled Meds: . amiodarone  150 mg Intravenous Once  . aspirin EC  81 mg Oral Daily  . atorvastatin  80 mg Oral Q2000  . clopidogrel  75 mg Oral Daily  . enoxaparin (LOVENOX) injection  65 mg Subcutaneous Q12H  . famotidine  40 mg Oral QPM  . FLUoxetine  20 mg Oral Daily  . insulin aspart  0-15 Units Subcutaneous Q4H  . insulin glargine  46 Units Subcutaneous Daily  . levothyroxine  88 mcg Oral Q0600  . mouth rinse  15 mL Mouth Rinse BID  . metoprolol succinate  100 mg Oral Daily  . mirabegron ER  25 mg Oral Daily  . mometasone-formoterol  2 puff Inhalation BID  . montelukast  10 mg Oral Daily  . morphine  30 mg Oral Q12H  . oxyCODONE-acetaminophen  1 tablet Oral 5 X Daily   And  . oxyCODONE  5 mg Oral 5 X Daily  . QUEtiapine  25 mg Oral Q24H  . sodium chloride flush  10-40 mL Intracatheter Q12H  . tiotropium  1 capsule Inhalation Daily  . traZODone  150 mg Oral QHS   Continuous Infusions: . amiodarone 30 mg/hr (04/23/18 1102)  . nafcillin IV 2 g (04/23/18 0936)  . phenylephrine (NEO-SYNEPHRINE) Adult infusion 4 mcg/min (04/23/18 0822)   PRN Meds:.acetaminophen **OR** acetaminophen, albuterol, ALPRAZolam, ondansetron **OR** ondansetron (ZOFRAN) IV, sodium chloride flush   Data Review:   Micro Results Recent Results (from the past 240 hour(s))  Blood Culture (routine x 2)     Status: Abnormal   Collection Time: 04/20/18  2:46 PM  Result Value Ref Range Status   Specimen Description   Final    BLOOD BLOOD RIGHT FOREARM Performed at [facility], [address], [place], [place] [address]    Special Requests   Final  BOTTLES DRAWN AEROBIC AND ANAEROBIC Blood Culture results may not be optimal due to an excessive volume of blood received in culture bottles Performed at  [facility], [address] [place], [place] [address]    Culture  Setup Time   Final    GRAM POSITIVE COCCI IN BOTH AEROBIC AND ANAEROBIC BOTTLES CRITICAL RESULT CALLED TO, READ BACK BY AND VERIFIED WITH: SHEEMA HALLAJI 04/21/2018 222 REC Performed at [facility], [address] [place], [place] [address]    Culture STAPHYLOCOCCUS AUREUS (A)  Final   Report Status 04/23/2018 FINAL  Final   Organism ID, Bacteria STAPHYLOCOCCUS AUREUS  Final      Susceptibility   Staphylococcus aureus - MIC*    CIPROFLOXACIN >=8 RESISTANT Resistant     ERYTHROMYCIN >=8 RESISTANT Resistant     GENTAMICIN <=0.5 SENSITIVE Sensitive     OXACILLIN <=0.25 SENSITIVE Sensitive     TETRACYCLINE <=1 SENSITIVE Sensitive     VANCOMYCIN 1 SENSITIVE Sensitive     TRIMETH/SULFA <=10 SENSITIVE Sensitive     CLINDAMYCIN >=8 RESISTANT Resistant     RIFAMPIN <=0.5 SENSITIVE Sensitive     Inducible Clindamycin NEGATIVE Sensitive     * STAPHYLOCOCCUS AUREUS  Blood Culture (routine x 2)     Status: Abnormal   Collection Time: 04/20/18  2:46 PM  Result Value Ref Range Status   Specimen Description   Final    BLOOD LEFT ANTECUBITAL Performed at [facility], [address] [place], [place] [address]    Special Requests   Final    BOTTLES DRAWN AEROBIC AND ANAEROBIC Blood Culture adequate volume Performed at [facility], [address] [place], [place] [address]    Culture  Setup Time   Final    GRAM POSITIVE COCCI IN BOTH AEROBIC AND ANAEROBIC BOTTLES CRITICAL VALUE NOTED.  VALUE IS CONSISTENT WITH PREVIOUSLY REPORTED AND CALLED VALUE. Performed at [facility], [address] [place], [place] [address]    Culture (A)  Final    STAPHYLOCOCCUS AUREUS SUSCEPTIBILITIES PERFORMED ON PREVIOUS CULTURE WITHIN THE LAST 5 DAYS. Performed at [facility], [address], [place], [place] [address]    Report Status 04/23/2018 FINAL  Final  Urine culture     Status:  Abnormal   Collection Time: 04/20/18  2:46 PM  Result Value Ref Range Status   Specimen Description   Final    URINE, RANDOM Performed at [facility], [address] [place], [place] [address]    Special Requests   Final    NONE Performed at [facility], [address] [place], [place] [address]    Culture >=100,000 COLONIES/mL STAPHYLOCOCCUS AUREUS (A)  Final   Report Status 04/22/2018 FINAL  Final   Organism ID, Bacteria STAPHYLOCOCCUS AUREUS (A)  Final      Susceptibility   Staphylococcus aureus - MIC*    CIPROFLOXACIN >=8 RESISTANT Resistant     GENTAMICIN <=0.5 SENSITIVE Sensitive     NITROFURANTOIN <=16 SENSITIVE Sensitive     OXACILLIN <=0.25 SENSITIVE Sensitive     TETRACYCLINE >=16 RESISTANT Resistant     VANCOMYCIN 1 SENSITIVE Sensitive     TRIMETH/SULFA <=10 SENSITIVE Sensitive     CLINDAMYCIN >=8 RESISTANT Resistant     RIFAMPIN <=0.5 SENSITIVE Sensitive     Inducible Clindamycin NEGATIVE Sensitive     * >=100,000 COLONIES/mL STAPHYLOCOCCUS AUREUS  Blood Culture ID Panel (Reflexed)     Status: Abnormal   Collection  Time: 04/20/18  2:46 PM  Result Value Ref Range Status   Enterococcus species NOT DETECTED NOT DETECTED Final   Listeria monocytogenes NOT DETECTED NOT DETECTED Final   Staphylococcus species DETECTED (A) NOT DETECTED Final    Comment: CRITICAL RESULT CALLED TO, READ BACK BY AND VERIFIED WITH: SHEEMA HALLAJI 04/21/2018 222 REC    Staphylococcus aureus (BCID) DETECTED (A) NOT DETECTED Final    Comment: Methicillin (oxacillin) susceptible Staphylococcus aureus (MSSA). Preferred therapy is anti staphylococcal beta lactam antibiotic (Cefazolin or Nafcillin), unless clinically contraindicated. CRITICAL RESULT CALLED TO, READ BACK BY AND VERIFIED WITH: SHEEMA HALLAJI 04/21/2018 222 REC    Methicillin resistance NOT DETECTED NOT DETECTED Final   Streptococcus species NOT DETECTED NOT DETECTED Final   Streptococcus agalactiae NOT DETECTED  NOT DETECTED Final   Streptococcus pneumoniae NOT DETECTED NOT DETECTED Final   Streptococcus pyogenes NOT DETECTED NOT DETECTED Final   Acinetobacter baumannii NOT DETECTED NOT DETECTED Final   Enterobacteriaceae species NOT DETECTED NOT DETECTED Final   Enterobacter cloacae complex NOT DETECTED NOT DETECTED Final   Escherichia coli NOT DETECTED NOT DETECTED Final   Klebsiella oxytoca NOT DETECTED NOT DETECTED Final   Klebsiella pneumoniae NOT DETECTED NOT DETECTED Final   Proteus species NOT DETECTED NOT DETECTED Final   Serratia marcescens NOT DETECTED NOT DETECTED Final   Haemophilus influenzae NOT DETECTED NOT DETECTED Final   Neisseria meningitidis NOT DETECTED NOT DETECTED Final   Pseudomonas aeruginosa NOT DETECTED NOT DETECTED Final   Candida albicans NOT DETECTED NOT DETECTED Final   Candida glabrata NOT DETECTED NOT DETECTED Final   Candida krusei NOT DETECTED NOT DETECTED Final   Candida parapsilosis NOT DETECTED NOT DETECTED Final   Candida tropicalis NOT DETECTED NOT DETECTED Final    Comment: Performed at [facility], [address] [place], [place] [address]  Novel Coronavirus, NAA (hospital order; send-out to ref lab)     Status: None   Collection Time: 04/20/18  4:03 PM  Result Value Ref Range Status   SARS-CoV-2, NAA NOT DETECTED NOT DETECTED Final    Comment: Negative (Not Detected) results do not exclude infection caused by SARS CoV 2 and should not be used as the sole basis for treatment or other patient management decisions. Optimum specimen types and timing for peak viral levels during infections caused  by SARS CoV 2 have not been determined. Collection of multiple specimens (types and time points) from the same patient may be necessary to detect the virus. Improper specimen collection and handling, sequence variability underlying assay primers and or probes, or the presence of organisms in  quantities less than the limit of detection of the assay may  lead to false negative results. Positive and negative predictive values of testing are highly dependent on prevalence. False negative results are more likely when prevalence of disease is high. (NOTE) The expected result is Negative (Not Detected). The SARS CoV 2 test is intended for the presumptive qualitative  detection of nucleic acid from SARS CoV 2 in upper and lower  respir atory specimens. Testing methodology is real time RT PCR. Test results must be correlated with clinical presentation and  evaluated in the context of other laboratory and epidemiologic data.  Test performance can be affected because the epidemiology and  clinical spectrum of infection caused by SARS CoV 2 is not fully  known. For example, the optimum types of specimens to collect and  when during the course of infection these specimens are most likely  to  contain detectable viral RNA may not be known. This test has not been Food and Drug Administration (FDA) cleared or  approved and has been authorized by FDA under an Emergency Use  Authorization (EUA). The test is only authorized for the duration of  the declaration that circumstances exist justifying the authorization  of emergency use of in vitro diagnostic tests for detection and or  diagnosis of SARS CoV 2 under Section 564(b)(1) of the Act, 21 U.S.C.  section 9700389624 3(b)(1), unless the authorization is terminated or   revoked sooner. [facility] is certified under the  Clinical Laboratory Improvement Amendments of 1988 (CLIA), 42 U.S.C.  section [phone], to perform high complexity tests. Performed at [facility] CLIA [id number] [address], [place], [place] [address] Laboratory Director: Turner Daniels, MD Performed at [facility], [address] [place], [place] [address]    Coronavirus Source NASOPHARYNGEAL  Final    Comment: Performed at [facility], [address]  [place], [place] [address]  MRSA PCR Screening     Status: None   Collection Time: 04/20/18  7:04 PM  Result Value Ref Range Status   MRSA by PCR NEGATIVE NEGATIVE Final    Comment:        The GeneXpert MRSA Assay (FDA approved for NASAL specimens only), is one component of a comprehensive MRSA colonization surveillance program. It is not intended to diagnose MRSA infection nor to guide or monitor treatment for MRSA infections. Performed at [facility], [address] [place], [place] [address]   Culture, blood (Routine X 2) w Reflex to ID Panel     Status: None (Preliminary result)   Collection Time: 04/23/18  2:53 AM  Result Value Ref Range Status   Specimen Description BLOOD RIGHT HAND  Final   Special Requests   Final    BOTTLES DRAWN AEROBIC AND ANAEROBIC Blood Culture results may not be optimal due to an excessive volume of blood received in culture bottles   Culture   Final    NO GROWTH < 12 HOURS Performed at [facility], [address] [place], [place] [address]    Report Status PENDING  Incomplete  Culture, blood (Routine X 2) w Reflex to ID Panel     Status: None (Preliminary result)   Collection Time: 04/23/18  2:55 AM  Result Value Ref Range Status   Specimen Description BLOOD RIGHT WRIST  Final   Special Requests   Final    BOTTLES DRAWN AEROBIC AND ANAEROBIC Blood Culture adequate volume   Culture   Final    NO GROWTH < 12 HOURS Performed at [facility], [address], [place], [place] [address]    Report Status PENDING  Incomplete    Radiology Reports Dg Chest Port 1 View  Result Date: 04/22/2018 CLINICAL DATA:  Acute respiratory failure.  History of CHF and COPD. EXAM: PORTABLE CHEST 1 VIEW COMPARISON:  Chest radiograph April 20, 2018 FINDINGS: Cardiac silhouette is mildly enlarged. Coronary artery stent. Calcified aortic arch. Worsening retrocardiac airspace opacity with small LEFT pleural effusion. Persistent small LEFT upper  lobe nodular density. New bandlike density RIGHT lung base. Mild chronic interstitial changes. No pneumothorax. Soft tissue planes and included osseous structures are non suspicious. Multiple EKG lines overlie the patient and may obscure subtle underlying pathology. IMPRESSION: 1. Worsening retrocardiac consolidation with small pleural effusion. New RIGHT lung base atelectasis, less likely pneumonia. Followup PA and  lateral chest X-ray is recommended in 3-4 weeks following trial of antibiotic therapy to ensure resolution and exclude underlying malignancy. 2. Stable nodular density LEFT upper lobe. 3. Mild cardiomegaly. Electronically Signed   By: Awilda Metro M.D.   On: 04/22/2018 03:58   Dg Chest Port 1 View  Result Date: 04/20/2018 CLINICAL DATA:  Fever, multiple complaint EXAM: PORTABLE CHEST 1 VIEW COMPARISON:  01/14/2013 FINDINGS: Mild bilateral interstitial thickening. Hazy left lower lobe airspace disease. Small area of airspace disease in the left upper lobe. Possible small left pleural effusion. No right pleural effusion. No pneumothorax. Stable cardiomediastinal silhouette. Thoracic aortic atherosclerosis. No acute osseous abnormality. IMPRESSION: 1. Hazy left lower lobe airspace disease. Small area of airspace disease in the left upper lobe. Findings are concerning for an infectious etiology. Electronically Signed   By: Elige Ko   On: 04/20/2018 15:08   Ct Renal Stone Study  Result Date: 04/20/2018 CLINICAL DATA:  58 year old female with abdominal pain EXAM: CT ABDOMEN AND PELVIS WITHOUT CONTRAST TECHNIQUE: Multidetector CT imaging of the abdomen and pelvis was performed following the standard protocol without IV contrast. COMPARISON:  None. FINDINGS: Lower chest: Respiratory motion limits evaluation. Atelectasis at the left lung base. Hepatobiliary: Cranial caudal span of the right liver measures greater than 18 cm. No focal lesion or nodular contour. Cholecystectomy. No intrahepatic or  extrahepatic biliary ductal dilatation. Pancreas: Unremarkable pancreas Spleen: Unremarkable spleen Adrenals/Urinary Tract: Unremarkable adrenal glands. Right kidney demonstrates regions of cortical thinning. No hydronephrosis. Vascular calcifications in the hilum of the right kidney. Unremarkable course of the right ureter. Left kidney demonstrates no hydronephrosis. Likely nonobstructive stone in the inferior collecting system, versus vascular calcifications. Unremarkable course the left ureter. Urinary bladder partially distended. Stomach/Bowel: Unremarkable stomach. Unremarkable small bowel. Moderate to large formed stool burden. No focal inflammatory changes. Normal appendix. Vascular/Lymphatic: Vascular calcifications of the aorta and bilateral iliac arteries. Venous collaterals on the anterior pelvic wall favored to reflect chronic left iliac main stenosis/occlusion. Reproductive: Unremarkable appearance of uterus. Other: Hazy edema/infiltration of the musculature associated with the left eye fragment a cruise. There are small gas locules present. This is in continuity with the anterior aspect of the L1-L2 disc space, which appears relatively widened compared to adjacent disc spaces. Musculoskeletal: Vacuum disc phenomenon at T12-L1. Compression fracture of L1. Irregularity of the inferior endplate with questionable disc space widening of L1-L2. Ill-defined soft tissue extends from the anterior aspect of the L1-L2 disc space into the left eye frag medic cruise. Degenerative changes of the lower lumbar spine. Vacuum disc phenomenon at L4-L5. IMPRESSION: Soft tissue changes of the left diaphragmatic cruz with small gas locules concerning for infection with gas-forming organism. This is in continuity with the anterior aspect of the L1-L2 disc space, and the source may be secondary to discitis/osteomyelitis at this site. Evaluation with MRI with contrast may be useful. Compression fracture of L1 of indeterminate  age. These results were discussed by telephone at the time of interpretation on 04/20/2018 at 4:10 pm with Dr. Sharma Covert. Aortic Atherosclerosis (ICD10-I70.0). 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 Correlation with a prior history of DVT may be useful. Hepatomegaly. Electronically Signed   By: Gilmer Mor D.O.   On: 04/20/2018 16:14   [place] Ekg Site Rite  Result Date: 04/21/2018 If Site Rite image not attached, placement could not be confirmed due to current cardiac rhythm.    CBC Recent Labs  Lab 04/20/18 1429 04/21/18 0355 04/22/18 0217 04/23/18 0255  WBC 22.0* 20.3* 21.2*  20.7*  HGB 13.0 13.4 12.6 12.4  HCT 39.8 41.2 40.6 39.4  PLT 320 330 352 400  MCV 76.8* 76.3* 79.1* 77.1*  MCH 25.1* 24.8* 24.6* 24.3*  MCHC 32.7 32.5 31.0 31.5  RDW 18.4* 18.5* 18.4* 18.8*  LYMPHSABS 0.7  --   --   --   MONOABS 0.8  --   --   --   EOSABS 0.2  --   --   --   BASOSABS 0.1  --   --   --     Chemistries  Recent Labs  Lab 04/20/18 1429  04/21/18 0838 04/21/18 1200 04/21/18 1534 04/22/18 0217 04/23/18 0255  NA 124*   < > 131* 131* 131* 136 141  K 5.0   < > 4.1 3.9 3.7 4.6 3.4*  CL 88*   < > 95* 93* 94* 97* 104  CO2 25   < > 26 25 27 29 27   GLUCOSE 650*   < > 194* 315* 278* 156* 72  BUN 62*   < > 43* 40* 40* 44* 33*  CREATININE 1.16*   < > 0.76 0.85 0.72 0.87 0.71  CALCIUM 8.9   < > 9.0 9.0 9.0 9.0 8.3*  MG  --   --   --   --   --  2.1 2.0  AST 21  --   --   --   --   --   --   ALT 22  --   --   --   --   --   --   ALKPHOS 152*  --   --   --   --   --   --   BILITOT 0.7  --   --   --   --   --   --    < > = values in this interval not displayed.   ------------------------------------------------------------------------------------------------------------------ estimated creatinine clearance is 70.4 mL/min (by C-G formula based on SCr of 0.71 mg/dL). ------------------------------------------------------------------------------------------------------------------ Recent Labs     04/20/18 1939  HGBA1C 9.6*   ------------------------------------------------------------------------------------------------------------------ No results for input(s): CHOL, HDL, LDLCALC, TRIG, CHOLHDL, LDLDIRECT in the last 72 hours. ------------------------------------------------------------------------------------------------------------------ No results for input(s): TSH, T4TOTAL, T3FREE, THYROIDAB in the last 72 hours.  Invalid input(s): FREET3 ------------------------------------------------------------------------------------------------------------------ No results for input(s): VITAMINB12, FOLATE, FERRITIN, TIBC, IRON, RETICCTPCT in the last 72 hours.  Coagulation profile Recent Labs  Lab 04/20/18 1654 04/21/18 0355 04/21/18 1534  INR >10.0* >10.0* 1.8*    No results for input(s): DDIMER in the last 72 hours.  Cardiac Enzymes Recent Labs  Lab 04/22/18 1441 04/22/18 2145 04/23/18 0255  TROPONINI 26.42* 26.52* 22.80*   ------------------------------------------------------------------------------------------------------------------ Invalid input(s): POCBNP    Assessment & Plan   58 year old female with past medical history of diabetes, hypertension, diabetic neuropathy, COPD with ongoing tobacco abuse, peripheral vascular disease status post left BKA, previous history of nephrolithiasis, history of DVT, chronic back pain who presents to the hospital due to back pain, shortness of breath, weakness   1.  MSSA bacteremia with L1-L2 discitis likely source right great toe ulcer Continue nafcillin COVID test negative  2.  A. fib with RVR heart rate stable appreciate cardiology input continue amiodarone, continue Lovenox  3.  COPD without acute exacerbation continue current therapy patient stable from respiratory standpoint  4.  Acute kidney injury resolved with IV hydration  5.  Elevated troponin further management per cardiology  6.  Diabetes type 2 continue  Lovenox, and sliding scale insulin  7.  Hyperlipidemia continue Lipitor  8.  Previous history of stroke continue Plavix       Code Status Orders  (From admission, onward)         Start     Ordered   04/20/18 1925  Full code  Continuous     04/20/18 1926        Code Status History    Date Active Date Inactive Code Status Order ID Comments User Context   04/20/2018 1903 04/20/2018 1926 Full Code [id number]  Houston Siren, MD Inpatient    Advance Directive Documentation     Most Recent Value  Type of Advance Directive  Living will  Pre-existing out of facility DNR order (yellow form or pink MOST form)  -  "MOST" Form in Place?  -           Consults cardiology pulmonary critical care  DVT Prophylaxis Lovenox  Lab Results  Component Value Date   PLT 400 04/23/2018     Time Spent in minutes   Greater than 50% of time spent in care coordination and counseling patient regarding the condition and plan of care.   Auburn Bilberry M.D on 04/23/2018 at 11:12 AM  Between 7am to 6pm - Pager - [phone]  After 6pm go to [email or web address] - Social research officer, government  [employer]   Office  [phone]

## 2018-04-23 NOTE — Progress Notes (Signed)
Pt has remained in NSR all day , with AMiodarone at 16.7 ( 30 Mg) , BP is slowly improving and support weaned from 4 mcg to 2. Bath given, pleaseant and cooperative this shift. Foley removed late am and pt voided 225 since.

## 2018-04-23 NOTE — Progress Notes (Signed)
Patient Name: Alexandra Goodman Date of Encounter: 04/23/2018  Hospital Problem List     Active Problems:   Sepsis (HCC)   Pressure injury of skin    Patient Profile     Patient is a 58 year old female with history of diabetes, coronary artery disease, history of previous DVT treated with chronic warfarin therapy, atrial fibrillation , polysubstance abuse, peripheral vascular disease admitted with complaints of urinary symptoms and was noted to have atrial fibrillation with rapid ventricular response.  Her troponin has elevated to 26.  She has had no chest pain.  Electrocardiogram is showing no ischemic changes.  She has been treated with chronic pain medication as an outpatient including Percocet and oxycodone.  She is also on MS Contin.  She has evidence of withdrawal confusion.  Last p.m. and recurrent atrial fibrillation with rapid ventricular response with relative hypotension.  Has been on Neo-Synephrine and has been loaded with amiodarone.  Rate is improved.  Subjective   Confused but denies chest pain.  Inpatient Medications    . amiodarone  150 mg Intravenous Once  . aspirin EC  81 mg Oral Daily  . atorvastatin  80 mg Oral Q2000  . clopidogrel  75 mg Oral Daily  . enoxaparin (LOVENOX) injection  65 mg Subcutaneous Q12H  . famotidine  40 mg Oral QPM  . FLUoxetine  20 mg Oral Daily  . insulin aspart  0-15 Units Subcutaneous Q4H  . insulin glargine  46 Units Subcutaneous Daily  . levothyroxine  88 mcg Oral Q0600  . mouth rinse  15 mL Mouth Rinse BID  . metoprolol succinate  100 mg Oral Daily  . mirabegron ER  25 mg Oral Daily  . mometasone-formoterol  2 puff Inhalation BID  . montelukast  10 mg Oral Daily  . morphine  30 mg Oral Q12H  . oxyCODONE-acetaminophen  1 tablet Oral 5 X Daily   And  . oxyCODONE  5 mg Oral 5 X Daily  . QUEtiapine  25 mg Oral Q24H  . sodium chloride flush  10-40 mL Intracatheter Q12H  . tiotropium  1 capsule Inhalation Daily  . traZODone  150 mg  Oral QHS    Vital Signs    Vitals:   04/23/18 0600 04/23/18 0615 04/23/18 0630 04/23/18 0700  BP: (!) 91/56 (!) 91/55 100/60 101/61  Pulse: 97 99 84 (!) 102  Resp: 20 17 (!) 22 19  Temp:    98.3 F (36.8 C)  TempSrc:      SpO2: 92% 94% 95% 93%  Weight:      Height:        Intake/Output Summary (Last 24 hours) at 04/23/2018 0826 Last data filed at 04/23/2018 0600 Gross per 24 hour  Intake 863.12 ml  Output 1000 ml  Net -136.88 ml   Filed Weights   04/20/18 1430 04/20/18 1900  Weight: 71.2 kg 65.2 kg    Physical Exam    GEN: Well nourished, well developed, in no acute distress.  HEENT: normal.  Neck: Supple, no JVD, carotid bruits, or masses. Cardiac: RRR, no murmurs, rubs, or gallops. No clubbing, cyanosis, edema.  Radials/DP/PT 2+ and equal bilaterally.  Respiratory:  Respirations regular and unlabored, clear to auscultation bilaterally. GI: Soft, nontender, nondistended, BS + x 4. MS: no deformity or atrophy. Skin: warm and dry, no rash. Neuro:  Strength and sensation are intact. Psych: Normal affect.  Labs    CBC Recent Labs    04/20/18 1429  04/22/18 0217 04/23/18  0255  WBC 22.0*   < > 21.2* 20.7*  NEUTROABS 20.1*  --   --   --   HGB 13.0   < > 12.6 12.4  HCT 39.8   < > 40.6 39.4  MCV 76.8*   < > 79.1* 77.1*  PLT 320   < > 352 400   < > = values in this interval not displayed.   Basic Metabolic Panel Recent Labs    40/98/11 0217 04/23/18 0255  NA 136 141  K 4.6 3.4*  CL 97* 104  CO2 29 27  GLUCOSE 156* 72  BUN 44* 33*  CREATININE 0.87 0.71  CALCIUM 9.0 8.3*  MG 2.1 2.0  PHOS 4.3  --    Liver Function Tests Recent Labs    04/20/18 1429  AST 21  ALT 22  ALKPHOS 152*  BILITOT 0.7  PROT 6.1*  ALBUMIN 2.1*   No results for input(s): LIPASE, AMYLASE in the last 72 hours. Cardiac Enzymes Recent Labs    04/22/18 1441 04/22/18 2145 04/23/18 0255  TROPONINI 26.42* 26.52* 22.80*   BNP Recent Labs    04/20/18 1939  BNP 610.0*    D-Dimer No results for input(s): DDIMER in the last 72 hours. Hemoglobin A1C Recent Labs    04/20/18 1939  HGBA1C 9.6*   Fasting Lipid Panel No results for input(s): CHOL, HDL, LDLCALC, TRIG, CHOLHDL, LDLDIRECT in the last 72 hours. Thyroid Function Tests No results for input(s): TSH, T4TOTAL, T3FREE, THYROIDAB in the last 72 hours.  Invalid input(s): FREET3  Telemetry    Atrial fibrillation converted to sinus rhythm/sinus tachycardia  ECG       Radiology    Dg Chest Port 1 View  Result Date: 04/22/2018 CLINICAL DATA:  Acute respiratory failure.  History of CHF and COPD. EXAM: PORTABLE CHEST 1 VIEW COMPARISON:  Chest radiograph April 20, 2018 FINDINGS: Cardiac silhouette is mildly enlarged. Coronary artery stent. Calcified aortic arch. Worsening retrocardiac airspace opacity with small LEFT pleural effusion. Persistent small LEFT upper lobe nodular density. New bandlike density RIGHT lung base. Mild chronic interstitial changes. No pneumothorax. Soft tissue planes and included osseous structures are non suspicious. Multiple EKG lines overlie the patient and may obscure subtle underlying pathology. IMPRESSION: 1. Worsening retrocardiac consolidation with small pleural effusion. New RIGHT lung base atelectasis, less likely pneumonia. Followup PA and lateral chest X-ray is recommended in 3-4 weeks following trial of antibiotic therapy to ensure resolution and exclude underlying malignancy. 2. Stable nodular density LEFT upper lobe. 3. Mild cardiomegaly. Electronically Signed   By: Awilda Metro M.D.   On: 04/22/2018 03:58   Dg Chest Port 1 View  Result Date: 04/20/2018 CLINICAL DATA:  Fever, multiple complaint EXAM: PORTABLE CHEST 1 VIEW COMPARISON:  01/14/2013 FINDINGS: Mild bilateral interstitial thickening. Hazy left lower lobe airspace disease. Small area of airspace disease in the left upper lobe. Possible small left pleural effusion. No right pleural effusion. No pneumothorax.  Stable cardiomediastinal silhouette. Thoracic aortic atherosclerosis. No acute osseous abnormality. IMPRESSION: 1. Hazy left lower lobe airspace disease. Small area of airspace disease in the left upper lobe. Findings are concerning for an infectious etiology. Electronically Signed   By: Elige Ko   On: 04/20/2018 15:08   Ct Renal Stone Study  Result Date: 04/20/2018 CLINICAL DATA:  58 year old female with abdominal pain EXAM: CT ABDOMEN AND PELVIS WITHOUT CONTRAST TECHNIQUE: Multidetector CT imaging of the abdomen and pelvis was performed following the standard protocol without IV contrast. COMPARISON:  None.  FINDINGS: Lower chest: Respiratory motion limits evaluation. Atelectasis at the left lung base. Hepatobiliary: Cranial caudal span of the right liver measures greater than 18 cm. No focal lesion or nodular contour. Cholecystectomy. No intrahepatic or extrahepatic biliary ductal dilatation. Pancreas: Unremarkable pancreas Spleen: Unremarkable spleen Adrenals/Urinary Tract: Unremarkable adrenal glands. Right kidney demonstrates regions of cortical thinning. No hydronephrosis. Vascular calcifications in the hilum of the right kidney. Unremarkable course of the right ureter. Left kidney demonstrates no hydronephrosis. Likely nonobstructive stone in the inferior collecting system, versus vascular calcifications. Unremarkable course the left ureter. Urinary bladder partially distended. Stomach/Bowel: Unremarkable stomach. Unremarkable small bowel. Moderate to large formed stool burden. No focal inflammatory changes. Normal appendix. Vascular/Lymphatic: Vascular calcifications of the aorta and bilateral iliac arteries. Venous collaterals on the anterior pelvic wall favored to reflect chronic left iliac main stenosis/occlusion. Reproductive: Unremarkable appearance of uterus. Other: Hazy edema/infiltration of the musculature associated with the left eye fragment a cruise. There are small gas locules present.  This is in continuity with the anterior aspect of the L1-L2 disc space, which appears relatively widened compared to adjacent disc spaces. Musculoskeletal: Vacuum disc phenomenon at T12-L1. Compression fracture of L1. Irregularity of the inferior endplate with questionable disc space widening of L1-L2. Ill-defined soft tissue extends from the anterior aspect of the L1-L2 disc space into the left eye frag medic cruise. Degenerative changes of the lower lumbar spine. Vacuum disc phenomenon at L4-L5. IMPRESSION: Soft tissue changes of the left diaphragmatic cruz with small gas locules concerning for infection with gas-forming organism. This is in continuity with the anterior aspect of the L1-L2 disc space, and the source may be secondary to discitis/osteomyelitis at this site. Evaluation with MRI with contrast may be useful. Compression fracture of L1 of indeterminate age. These results were discussed by telephone at the time of interpretation on 04/20/2018 at 4:10 pm with Dr. Sharma Covert. Aortic Atherosclerosis (ICD10-I70.0). 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 Correlation with a prior history of DVT may be useful. Hepatomegaly. Electronically Signed   By: Gilmer Mor D.O.   On: 04/20/2018 16:14   [place] Ekg Site Rite  Result Date: 04/21/2018 If Site Rite image not attached, placement could not be confirmed due to current cardiac rhythm.   Assessment & Plan    59 year old female with coronary artery disease, diabetes, history of DVT, history of atrial fibrillation, history of polysubstance abuse admitted with atrial fibrillation with rapid ventricular response and probable sepsis.  Troponin is elevated to 26.  Etiology of this is likely multifactorial to include demand ischemia given atrial fibrillation with rapid ventricular response in the face of acute on chronic renal insufficiency.  She is currently refusing blood draws.  Atrial fibrillation-continue with IV amiodarone load for now.  Will hold p.o.  metoprolol for now due to relative hypotension.  Would agree with weaning off of Neo-Synephrine if possible.  Will resume warfarin if the patient will allow blood draws following INR prior to discharge.  Elevated troponin likely multifactorial to include demand ischemia in the face of probable coronary disease.  This clinical picture does not appear to be an acute coronary syndrome.  Given the fact the patient is refusing blood draws and remains fairly agitated would defer invasive evaluation at present.  Consideration for invasive evaluation pending hospital course when other issues are more stable may be warranted given elevated cardiac markers although electrocardiogram and patient symptoms are not consistent with an acute coronary syndrome.  Signed, Darlin Priestly Yaquelin Langelier MD 04/23/2018, 8:26 AM  Pager: [phone]

## 2018-04-23 NOTE — Progress Notes (Signed)
CRITICAL CARE NOTE     SUBJECTIVE FINDINGS & SIGNIFICANT EVENTS   Patient clinically improved, was more awake attempting to use telephone today.  Still some periods of confusion  PAST MEDICAL HISTORY   Past Medical History:  Diagnosis Date  . Asthma   . CHF (congestive heart failure) (HCC)   . Chronic pain   . COPD (chronic obstructive pulmonary disease) (HCC)   . Diabetes mellitus (HCC)   . Peripheral vascular disease (HCC)   . Stroke (cerebrum) [facility])      SURGICAL HISTORY   Past Surgical History:  Procedure Laterality Date  . below the knee LLE amputation Left      FAMILY HISTORY   Family History  Problem Relation Age of Onset  . Heart disease Mother   . Heart disease Father      SOCIAL HISTORY   Social History   Tobacco Use  . Smoking status: Current Every Day Smoker    Packs/day: 2.00    Years: 45.00    Pack years: 90.00    Types: Cigarettes  . Smokeless tobacco: Never Used  Substance Use Topics  . Alcohol use: Not Currently  . Drug use: Never     MEDICATIONS   Current Medication:  Current Facility-Administered Medications:  .  acetaminophen (TYLENOL) tablet 650 mg, 650 mg, Oral, Q6H PRN **OR** acetaminophen (TYLENOL) suppository 650 mg, 650 mg, Rectal, Q6H PRN, Sainani, Vivek J, MD .  albuterol (PROVENTIL HFA;VENTOLIN HFA) 108 (90 Base) MCG/ACT inhaler 2 puff, 2 puff, Inhalation, Q4H PRN, Sainani, Rolly Pancake, MD .  ALPRAZolam Prudy Feeler) tablet 0.5 mg, 0.5 mg, Oral, TID PRN, Vida Rigger, MD .  amiodarone (NEXTERONE) 1.8 mg/mL load via infusion 150 mg, 150 mg, Intravenous, Once **FOLLOWED BY** [EXPIRED] amiodarone (NEXTERONE PREMIX) 360-4.14 MG/200ML-% (1.8 mg/mL) IV infusion, 60 mg/hr, Intravenous, Continuous, Stopped at 04/23/18 1119 **FOLLOWED BY** amiodarone (NEXTERONE  PREMIX) 360-4.14 MG/200ML-% (1.8 mg/mL) IV infusion, 30 mg/hr, Intravenous, Continuous, Blakeney, Dana G, NP, Last Rate: 16.67 mL/hr at 04/23/18 1102, 30 mg/hr at 04/23/18 1102 .  aspirin EC tablet 81 mg, 81 mg, Oral, Daily, Darinda [place], MD, 81 mg at 04/23/18 0815 .  atorvastatin (LIPITOR) tablet 80 mg, 80 mg, Oral, Q2000, Houston Siren, MD, 80 mg at 04/22/18 2029 .  clopidogrel (PLAVIX) tablet 75 mg, 75 mg, Oral, Daily, [place] Siren, MD, 75 mg at 04/23/18 0815 .  enoxaparin (LOVENOX) injection 65 mg, 65 mg, Subcutaneous, Q12H, Vida Rigger, MD, 65 mg at 04/23/18 0937 .  famotidine (PEPCID) tablet 40 mg, 40 mg, Oral, QPM, Sainani, Rolly Pancake, MD, 40 mg at 04/22/18 2030 .  FLUoxetine (PROZAC) capsule 20 mg, 20 mg, Oral, Daily, Sainani, Rolly Pancake, MD, 20 mg at 04/23/18 0815 .  insulin aspart (novoLOG) injection 0-15 Units, 0-15 Units, Subcutaneous, Q4H, Eugenie Norrie, NP, 8 Units at 04/23/18 1205 .  insulin glargine (LANTUS) injection 46 Units, 46 Units, Subcutaneous, Daily, Judithe Modest, NP, 46 Units at 04/23/18 0803 .  levothyroxine (SYNTHROID, LEVOTHROID) tablet 88 mcg, 88 mcg, Oral, Q0600, Houston Siren, MD, 88 mcg at 04/23/18 0815 .  MEDLINE mouth rinse, 15 mL, Mouth Rinse, BID, Eren Ryser, MD, 15 mL at 04/23/18 1103 .  metoprolol succinate (TOPROL-XL) 24 hr tablet 100 mg, 100 mg, Oral, Daily, Sainani, Vivek J, MD, 100 mg at 04/21/18 0808 .  mirabegron ER (MYRBETRIQ) tablet 25 mg, 25 mg, Oral, Daily, Houston Siren, MD, 25 mg at 04/23/18 0815 .  mometasone-formoterol (DULERA) 200-5 MCG/ACT inhaler  2 puff, 2 puff, Inhalation, BID, Houston Siren, MD, 2 puff at 04/23/18 0807 .  montelukast (SINGULAIR) tablet 10 mg, 10 mg, Oral, Daily, Sainani, Rolly Pancake, MD, 10 mg at 04/23/18 0815 .  morphine (MS CONTIN) 12 hr tablet 30 mg, 30 mg, Oral, Q12H, Sainani, Vivek J, MD, 30 mg at 04/23/18 0820 .  nafcillin 2 g in sodium chloride 0.9 % 100 mL IVPB, 2 g, Intravenous, Q4H,  Ravishankar, Jayashree, MD, Last Rate: 200 mL/hr at 04/23/18 1203, 2 g at 04/23/18 1203 .  ondansetron (ZOFRAN) tablet 4 mg, 4 mg, Oral, Q6H PRN **OR** ondansetron (ZOFRAN) injection 4 mg, 4 mg, Intravenous, Q6H PRN, Cherlynn Kaiser, Rolly Pancake, MD .  oxyCODONE-acetaminophen (PERCOCET/ROXICET) 5-325 MG per tablet 1 tablet, 1 tablet, Oral, 5 X Daily, 1 tablet at 04/23/18 1354 **AND** oxyCODONE (Oxy IR/ROXICODONE) immediate release tablet 5 mg, 5 mg, Oral, 5 X Daily, Sainani, Vivek J, MD, 5 mg at 04/23/18 1354 .  phenylephrine (NEO-SYNEPHRINE) 40 mg in sodium chloride 0.9 % 250 mL (0.16 mg/mL) infusion, 0-400 mcg/min, Intravenous, Titrated, Harlon Ditty D, NP, Last Rate: 2.25 mL/hr at 04/23/18 1211, 6 mcg/min at 04/23/18 1211 .  QUEtiapine (SEROQUEL) tablet 25 mg, 25 mg, Oral, Q24H, Paralee Pendergrass, MD, 25 mg at 04/22/18 2029 .  sodium chloride flush (NS) 0.9 % injection 10-40 mL, 10-40 mL, Intracatheter, Q12H, Altamese Dilling, MD, 30 mL at 04/22/18 2126 .  sodium chloride flush (NS) 0.9 % injection 10-40 mL, 10-40 mL, Intracatheter, PRN, Altamese Dilling, MD .  tiotropium (SPIRIVA) inhalation capsule ([facility] use ONLY) 18 mcg, 1 capsule, Inhalation, Daily, Houston Siren, MD, 18 mcg at 04/23/18 0804 .  traZODone (DESYREL) tablet 150 mg, 150 mg, Oral, QHS, Sainani, Vivek J, MD, 150 mg at 04/22/18 2030    ALLERGIES   Skin protectants, misc.; Flagyl [metronidazole]; Gabapentin; Levetiracetam; Tape; Ace inhibitors; Ertapenem; and Sertraline hcl    REVIEW OF SYSTEMS    Unable to obtain due to confusion  PHYSICAL EXAMINATION   Vitals:   04/23/18 1300 04/23/18 1400  BP: (!) 97/55 (!) 114/48  Pulse:    Resp: 19 (!) 21  Temp:  98.4 F (36.9 C)  SpO2:      GENERAL: Mildly confused but improved HEAD: Normocephalic, atraumatic.  EYES: Pupils equal, round, reactive to light.  No scleral icterus.  MOUTH: Moist mucosal membrane. NECK: Supple. No thyromegaly. No nodules. No JVD.   PULMONARY: Bibasilar crackles CARDIOVASCULAR: S1 and S2. Regular rate and rhythm. No murmurs, rubs, or gallops.  GASTROINTESTINAL: Soft, nontender, non-distended. No masses. Positive bowel sounds. No hepatosplenomegaly.  MUSCULOSKELETAL: No swelling, clubbing, or edema.  NEUROLOGIC: Mild distress due to acute illness SKIN:intact,warm,dry   LABS AND IMAGING     -I personally reviewed most recent blood work, imaging and microbiology - significant findings today are hypokalemia, leukocytosis  LAB RESULTS: Recent Labs  Lab 04/21/18 1534 04/22/18 0217 04/23/18 0255  NA 131* 136 141  K 3.7 4.6 3.4*  CL 94* 97* 104  CO2 27 29 27   BUN 40* 44* 33*  CREATININE 0.72 0.87 0.71  GLUCOSE 278* 156* 72   Recent Labs  Lab 04/21/18 0355 04/22/18 0217 04/23/18 0255  HGB 13.4 12.6 12.4  HCT 41.2 40.6 39.4  WBC 20.3* 21.2* 20.7*  PLT 330 352 400     IMAGING RESULTS: No results found.    ASSESSMENT AND PLAN      Acuteon chronic hypoxic Respiratory Failure Novel coronavirus negative -Background history of CHF and COPD-no signs of  pulmonary edema on CT or x-ray, no clinical signs of acute COPD exacerbation at this time - clinically improved but aggitated this am    CARDIO-need transesophageal echo due to staph aureus bacteremia-will discuss with Dr. Lady Gary cardiology     -AFrvr- resolved - rate/rythm contorlled -on amiodarone                       no chest pain however pt with DM, critically ill - EKG without STelevations or new LBBB - - initiating modified acs protocol for now - cardiology on case - appreciate recommendations - med management for now     Mixed acid base disorder with highanion gap metabolic acidosis and metabolic alkalosis-resolved -Complicated by pseudohyponatremia,hypoalbuminemia with hyporchloremia -Likely due to DKA and sepsis -Urine ketones pending-initiating phase 1 DKA protocol    Atrial  fibrillation with rapid ventricular response-resolved -Cardizem gtt.stopped now on PO -oxygen as needed -follow up cardiac enzymes as indicated-troponin trending up Cardiology consultation-appreciate recommendations ICU monitoring    Moderate protein calorie malnutrition-albumin 2.1 -Dietary consultation-appreciate recommendation    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  -hx of bilateral pulmonary nodule with hilar and mediastinal lymphadenopathy - hold bronchodilator therapy for now as patient is in AFrVR - outpatient appt made with [facility] pulmonology -incentive spirometry and flutter valve for BPH   Acute kidney Injury- KDIGO stage I -most likely due topre-renal azotemia vs ATN -Resuscitative fluids- 20cc/kg due to her CHF status -follow chem 7 -follow UO -continue Foley Catheter-assess need daily   Sepsis -Due to staph aureus- bacteremia -ID on case- appreciate input-on nafcillin  ID -continue IV abx as prescibed -follow up cultures  GI/Nutrition GI PROPHYLAXIS as indicated DIET-->TF's as tolerated Constipation protocol as indicated  ENDO - ICU hypoglycemic\Hyperglycemia protocol -check FSBS per protocol   ELECTROLYTES -follow labs as needed -replace as needed -pharmacy consultation   DVT/GI PRX ordered -SCDs  TRANSFUSIONS AS NEEDED MONITOR FSBS ASSESS the need for LABS as needed  This document was prepared using Dragon voice recognition software and may include unintentional dictation errors.  Critical care time (minutes):35 Critical care time was exclusive of: Separately billable procedures and treating other patients Critical care was necessary to treat or prevent imminent or life-threatening deterioration of the following conditions:Sepsis with bacteremia ,severe hyperglycemia, atrial fibrillation with rapid ventricular  response, discitis, acute kidney injury, protein calorie malnutrition, , acute on chronic hypoxemic respiratory failure with possible novelcoronavirus infection. Critical care was time spent personally by me on the following activities: Development of treatment plan with patient or surrogate, discussions with consultants, evaluation of patient's response to treatment, examination of patient, obtaining history from patient or surrogate, ordering and performing treatments and interventions, ordering and review of laboratory studies and re-evaluation of patient's condition. I assumed direction of critical care for this patient from another provider in my specialty: no   Vida Rigger, M.D.  Division of Pulmonary & Critical Care Medicine  [facility]

## 2018-04-24 LAB — CBC WITH DIFFERENTIAL/PLATELET
Abs Immature Granulocytes: 0.15 10*3/uL — ABNORMAL HIGH (ref 0.00–0.07)
Basophils Absolute: 0 10*3/uL (ref 0.0–0.1)
Basophils Relative: 0 %
Eosinophils Absolute: 0.2 10*3/uL (ref 0.0–0.5)
Eosinophils Relative: 1 %
HCT: 33.8 % — ABNORMAL LOW (ref 36.0–46.0)
Hemoglobin: 10.9 g/dL — ABNORMAL LOW (ref 12.0–15.0)
Immature Granulocytes: 1 %
Lymphocytes Relative: 7 %
Lymphs Abs: 1.3 10*3/uL (ref 0.7–4.0)
MCH: 24.8 pg — ABNORMAL LOW (ref 26.0–34.0)
MCHC: 32.2 g/dL (ref 30.0–36.0)
MCV: 76.8 fL — ABNORMAL LOW (ref 80.0–100.0)
Monocytes Absolute: 0.7 10*3/uL (ref 0.1–1.0)
Monocytes Relative: 4 %
Neutro Abs: 15.2 10*3/uL — ABNORMAL HIGH (ref 1.7–7.7)
Neutrophils Relative %: 87 %
Platelets: 333 10*3/uL (ref 150–400)
RBC: 4.4 MIL/uL (ref 3.87–5.11)
RDW: 19.2 % — ABNORMAL HIGH (ref 11.5–15.5)
WBC: 17.5 10*3/uL — ABNORMAL HIGH (ref 4.0–10.5)
nRBC: 0 % (ref 0.0–0.2)

## 2018-04-24 LAB — BASIC METABOLIC PANEL
Anion gap: 9 (ref 5–15)
BUN: 33 mg/dL — ABNORMAL HIGH (ref 6–20)
CO2: 26 mmol/L (ref 22–32)
Calcium: 7.7 mg/dL — ABNORMAL LOW (ref 8.9–10.3)
Chloride: 101 mmol/L (ref 98–111)
Creatinine, Ser: 0.7 mg/dL (ref 0.44–1.00)
GFR calc Af Amer: >60 mL/min (ref >60)
GFR calc non Af Amer: >60 mL/min (ref >60)
Glucose, Bld: 95 mg/dL (ref 70–99)
Potassium: 3.6 mmol/L (ref 3.5–5.1)
Sodium: 136 mmol/L (ref 135–145)

## 2018-04-24 LAB — GLUCOSE, CAPILLARY
Glucose-Capillary: 100 mg/dL — ABNORMAL HIGH (ref 70–99)
Glucose-Capillary: 94 mg/dL (ref 70–99)
Glucose-Capillary: 135 mg/dL — ABNORMAL HIGH (ref 70–99)
Glucose-Capillary: 144 mg/dL — ABNORMAL HIGH (ref 70–99)
Glucose-Capillary: 304 mg/dL — ABNORMAL HIGH (ref 70–99)

## 2018-04-24 MED ORDER — SODIUM CHLORIDE 0.9 % IV SOLN
INTRAVENOUS | Status: DC | PRN
  Administered 2018-04-24 – 2018-04-25 (×5): 500 mL via INTRAVENOUS
  Administered 2018-04-26 – 2018-04-27 (×3): 250 mL via INTRAVENOUS

## 2018-04-24 MED ORDER — HYDRALAZINE HCL 20 MG/ML IJ SOLN: 10.0000 mg | INTRAMUSCULAR | Status: DC | PRN

## 2018-04-24 MED ORDER — AMIODARONE HCL 200 MG PO TABS
400.0000 mg | ORAL_TABLET | Freq: Two times a day (BID) | ORAL | Status: DC
  Administered 2018-04-24 – 2018-04-27 (×7): 400 mg via ORAL
  Filled 2018-04-24 (×7): qty 2

## 2018-04-24 MED ORDER — INSULIN ASPART 100 UNIT/ML ~~LOC~~ SOLN
0.0000 [IU] | Freq: Three times a day (TID) | SUBCUTANEOUS | Status: DC
  Administered 2018-04-24 (×2): 2 [IU] via SUBCUTANEOUS
  Administered 2018-04-25 – 2018-04-26 (×2): 3 [IU] via SUBCUTANEOUS
  Administered 2018-04-27: 5 [IU] via SUBCUTANEOUS
  Filled 2018-04-24 (×5): qty 1

## 2018-04-24 MED ORDER — INSULIN ASPART 100 UNIT/ML ~~LOC~~ SOLN
0.0000 [IU] | Freq: Every day | SUBCUTANEOUS | Status: DC
  Administered 2018-04-24: 4 [IU] via SUBCUTANEOUS
  Filled 2018-04-24 (×2): qty 1

## 2018-04-24 NOTE — Progress Notes (Signed)
Patient Name: Alexandra Goodman Date of Encounter: 04/24/2018  Hospital Problem List     Active Problems:   Sepsis (HCC)   Pressure injury of skin    Patient Profile     Patient with history of narcotic abuse, bacteremia with atrial fibrillation rapid ventricular response.  Appears to have converted to sinus rhythm IV amiodarone.  Denies chest pain or shortness of breath.  Requested to do transesophageal echo by infectious disease.  Subjective   No chest pain.  Is asking to go home.  Denies shortness of breath.  Inpatient Medications    . amiodarone  150 mg Intravenous Once  . amiodarone  400 mg Oral BID  . aspirin EC  81 mg Oral Daily  . atorvastatin  80 mg Oral Q2000  . clopidogrel  75 mg Oral Daily  . enoxaparin (LOVENOX) injection  65 mg Subcutaneous Q12H  . famotidine  40 mg Oral QPM  . FLUoxetine  20 mg Oral Daily  . insulin aspart  0-15 Units Subcutaneous TID WC  . insulin aspart  0-5 Units Subcutaneous QHS  . insulin glargine  46 Units Subcutaneous Daily  . levothyroxine  88 mcg Oral Q0600  . mouth rinse  15 mL Mouth Rinse BID  . mirabegron ER  25 mg Oral Daily  . mometasone-formoterol  2 puff Inhalation BID  . montelukast  10 mg Oral Daily  . morphine  30 mg Oral Q12H  . oxyCODONE-acetaminophen  1 tablet Oral 5 X Daily  . QUEtiapine  25 mg Oral Q24H  . sodium chloride flush  10-40 mL Intracatheter Q12H  . tiotropium  1 capsule Inhalation Daily  . traZODone  150 mg Oral QHS    Vital Signs    Vitals:   04/24/18 0025 04/24/18 0057 04/24/18 0436 04/24/18 0756  BP: 98/62 (!) 98/51 (!) 97/58 112/67  Pulse:  84 84 90  Resp: 20 20 20 20   Temp:  99.6 F (37.6 C) 99.9 F (37.7 C) 100.1 F (37.8 C)  TempSrc:  Oral Oral Oral  SpO2:  91% 91% 90%  Weight:  68 kg    Height:        Intake/Output Summary (Last 24 hours) at 04/24/2018 1039 Last data filed at 04/24/2018 0630 Gross per 24 hour  Intake 2110.21 ml  Output 800 ml  Net 1310.21 ml   Filed Weights    04/20/18 1430 04/20/18 1900 04/24/18 0057  Weight: 71.2 kg 65.2 kg 68 kg    Physical Exam    GEN: Well nourished, well developed, in no acute distress.  HEENT: normal.  Neck: Supple, no JVD, carotid bruits, or masses. Cardiac: RRR, no murmurs, rubs, or gallops. No clubbing, cyanosis, edema.  Radials/DP/PT 2+ and equal bilaterally.  Respiratory:  Respirations regular and unlabored, clear to auscultation bilaterally. GI: Soft, nontender, nondistended, BS + x 4. MS: no deformity or atrophy. Skin: warm and dry, no rash. Neuro:  Strength and sensation are intact. Psych: Normal affect.  Labs    CBC Recent Labs    04/23/18 0255 04/24/18 0514  WBC 20.7* 17.5*  NEUTROABS  --  15.2*  HGB 12.4 10.9*  HCT 39.4 33.8*  MCV 77.1* 76.8*  PLT 400 333   Basic Metabolic Panel Recent Labs    47/82/9502/04/08 0217 04/23/18 0255 04/24/18 0514  NA 136 141 136  K 4.6 3.4* 3.6  CL 97* 104 101  CO2 29 27 26   GLUCOSE 156* 72 95  BUN 44* 33* 33*  CREATININE  0.87 0.71 0.70  CALCIUM 9.0 8.3* 7.7*  MG 2.1 2.0  --   PHOS 4.3  --   --    Liver Function Tests No results for input(s): AST, ALT, ALKPHOS, BILITOT, PROT, ALBUMIN in the last 72 hours. No results for input(s): LIPASE, AMYLASE in the last 72 hours. Cardiac Enzymes Recent Labs    04/22/18 1441 04/22/18 2145 04/23/18 0255  TROPONINI 26.42* 26.52* 22.80*   BNP No results for input(s): BNP in the last 72 hours. D-Dimer No results for input(s): DDIMER in the last 72 hours. Hemoglobin A1C No results for input(s): HGBA1C in the last 72 hours. Fasting Lipid Panel No results for input(s): CHOL, HDL, LDLCALC, TRIG, CHOLHDL, LDLDIRECT in the last 72 hours. Thyroid Function Tests No results for input(s): TSH, T4TOTAL, T3FREE, THYROIDAB in the last 72 hours.  Invalid input(s): FREET3  Telemetry    Sinus rhythm  ECG       Radiology    Dg Chest Port 1 View  Result Date: 04/22/2018 CLINICAL DATA:  Acute respiratory failure.   History of CHF and COPD. EXAM: PORTABLE CHEST 1 VIEW COMPARISON:  Chest radiograph April 20, 2018 FINDINGS: Cardiac silhouette is mildly enlarged. Coronary artery stent. Calcified aortic arch. Worsening retrocardiac airspace opacity with small LEFT pleural effusion. Persistent small LEFT upper lobe nodular density. New bandlike density RIGHT lung base. Mild chronic interstitial changes. No pneumothorax. Soft tissue planes and included osseous structures are non suspicious. Multiple EKG lines overlie the patient and may obscure subtle underlying pathology. IMPRESSION: 1. Worsening retrocardiac consolidation with small pleural effusion. New RIGHT lung base atelectasis, less likely pneumonia. Followup PA and lateral chest X-ray is recommended in 3-4 weeks following trial of antibiotic therapy to ensure resolution and exclude underlying malignancy. 2. Stable nodular density LEFT upper lobe. 3. Mild cardiomegaly. Electronically Signed   By: Awilda Metro M.D.   On: 04/22/2018 03:58   Dg Chest Port 1 View  Result Date: 04/20/2018 CLINICAL DATA:  Fever, multiple complaint EXAM: PORTABLE CHEST 1 VIEW COMPARISON:  01/14/2013 FINDINGS: Mild bilateral interstitial thickening. Hazy left lower lobe airspace disease. Small area of airspace disease in the left upper lobe. Possible small left pleural effusion. No right pleural effusion. No pneumothorax. Stable cardiomediastinal silhouette. Thoracic aortic atherosclerosis. No acute osseous abnormality. IMPRESSION: 1. Hazy left lower lobe airspace disease. Small area of airspace disease in the left upper lobe. Findings are concerning for an infectious etiology. Electronically Signed   By: Elige Ko   On: 04/20/2018 15:08   Ct Renal Stone Study  Result Date: 04/20/2018 CLINICAL DATA:  58 year old female with abdominal pain EXAM: CT ABDOMEN AND PELVIS WITHOUT CONTRAST TECHNIQUE: Multidetector CT imaging of the abdomen and pelvis was performed following the standard protocol  without IV contrast. COMPARISON:  None. FINDINGS: Lower chest: Respiratory motion limits evaluation. Atelectasis at the left lung base. Hepatobiliary: Cranial caudal span of the right liver measures greater than 18 cm. No focal lesion or nodular contour. Cholecystectomy. No intrahepatic or extrahepatic biliary ductal dilatation. Pancreas: Unremarkable pancreas Spleen: Unremarkable spleen Adrenals/Urinary Tract: Unremarkable adrenal glands. Right kidney demonstrates regions of cortical thinning. No hydronephrosis. Vascular calcifications in the hilum of the right kidney. Unremarkable course of the right ureter. Left kidney demonstrates no hydronephrosis. Likely nonobstructive stone in the inferior collecting system, versus vascular calcifications. Unremarkable course the left ureter. Urinary bladder partially distended. Stomach/Bowel: Unremarkable stomach. Unremarkable small bowel. Moderate to large formed stool burden. No focal inflammatory changes. Normal appendix. Vascular/Lymphatic: Vascular calcifications of  the aorta and bilateral iliac arteries. Venous collaterals on the anterior pelvic wall favored to reflect chronic left iliac main stenosis/occlusion. Reproductive: Unremarkable appearance of uterus. Other: Hazy edema/infiltration of the musculature associated with the left eye fragment a cruise. There are small gas locules present. This is in continuity with the anterior aspect of the L1-L2 disc space, which appears relatively widened compared to adjacent disc spaces. Musculoskeletal: Vacuum disc phenomenon at T12-L1. Compression fracture of L1. Irregularity of the inferior endplate with questionable disc space widening of L1-L2. Ill-defined soft tissue extends from the anterior aspect of the L1-L2 disc space into the left eye frag medic cruise. Degenerative changes of the lower lumbar spine. Vacuum disc phenomenon at L4-L5. IMPRESSION: Soft tissue changes of the left diaphragmatic cruz with small gas  locules concerning for infection with gas-forming organism. This is in continuity with the anterior aspect of the L1-L2 disc space, and the source may be secondary to discitis/osteomyelitis at this site. Evaluation with MRI with contrast may be useful. Compression fracture of L1 of indeterminate age. These results were discussed by telephone at the time of interpretation on 04/20/2018 at 4:10 pm with Dr. Sharma Covert. Aortic Atherosclerosis (ICD10-I70.0). 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 Correlation with a prior history of DVT may be useful. Hepatomegaly. Electronically Signed   By: Gilmer Mor D.O.   On: 04/20/2018 16:14   [place] Ekg Site Rite  Result Date: 04/21/2018 If Site Rite image not attached, placement could not be confirmed due to current cardiac rhythm.   Assessment & Plan    Patient with history of paroxysmal atrial fibrillation had been with rapid ventricular response.  Currently on IV amiodarone load.  Will discontinue IV amiodarone and convert to p.o. amiodarone at 40 mg twice daily.  Will follow heart rate and hemodynamics.  Discussed chronic anticoagulation resumption prior to discharge.  Bacteremia-asked to do transesophageal echo by infectious disease.  Transthoracic echo has not been done.  Will review transthoracic echo when available and if no obvious vegetations consider transesophageal echo.  Given the patient's narcotic use, will need anesthesia to administer propofol to allow adequate sedation.  Will discuss this with anesthesia tomorrow.  Will make n.p.o. after midnight in case transesophageal echo is needed.  Abnormal troponin-likely demand ischemia.  Not a candidate for invasive evaluation at present as patient is noncompliant with medications and has been refusing blood draws.  Signed, Darlin Priestly Antwone Capozzoli MD 04/24/2018, 10:39 AM  Pager: [phone]

## 2018-04-24 NOTE — Progress Notes (Signed)
[employer] - [place] at [facility]                                                                                                                                                                                  Patient Demographics   Alexandra Goodman, is a 58 y.o. female, DOB - 1961/01/06, KGS:[id number]  Admit date - 04/20/2018   Admitting Physician Houston Siren, MD  Outpatient Primary MD for the patient is Kimel-Scott, Clydie Braun, MD   LOS - 4  Subjective: Patient doing better    Review of Systems:   CONSTITUTIONAL: No documented fever. No fatigue, weakness. No weight gain, no weight loss.  EYES: No blurry or double vision.  ENT: No tinnitus. No postnasal drip. No redness of the oropharynx.  RESPIRATORY: No cough, no wheeze, no hemoptysis. No dyspnea.  CARDIOVASCULAR: No chest pain. No orthopnea. No palpitations. No syncope.  GASTROINTESTINAL: No nausea, no vomiting or diarrhea. No abdominal pain. No melena or hematochezia.  GENITOURINARY: No dysuria or hematuria.  ENDOCRINE: No polyuria or nocturia. No heat or cold intolerance.  HEMATOLOGY: No anemia. No bruising. No bleeding.  INTEGUMENTARY: No rashes. No lesions.  MUSCULOSKELETAL: No arthritis. No swelling. No gout.  NEUROLOGIC: No numbness, tingling, or ataxia. No seizure-type activity.  PSYCHIATRIC: No anxiety. No insomnia. No ADD.    Vitals:   Vitals:   04/24/18 0057 04/24/18 0436 04/24/18 0756 04/24/18 1102  BP: (!) 98/51 (!) 97/58 112/67 (!) 97/54  Pulse: 84 84 90 88  Resp: 20 20 20    Temp: 99.6 F (37.6 C) 99.9 F (37.7 C) 100.1 F (37.8 C)   TempSrc: Oral Oral Oral   SpO2: 91% 91% 90% 92%  Weight: 68 kg     Height:        Wt Readings from Last 3 Encounters:  04/24/18 68 kg     Intake/Output Summary (Last 24 hours) at 04/24/2018 1149 Last data filed at 04/24/2018 0630 Gross per 24 hour  Intake 2110.21 ml  Output 800 ml  Net 1310.21 ml    Physical Exam:   GENERAL: Pleasant-appearing in  no apparent distress.  HEAD, EYES, EARS, NOSE AND THROAT: Atraumatic, normocephalic. Extraocular muscles are intact. Pupils equal and reactive to light. Sclerae anicteric. No conjunctival injection. No oro-pharyngeal erythema.  NECK: Supple. There is no jugular venous distention. No bruits, no lymphadenopathy, no thyromegaly.  HEART: Regular rate and rhythm,. No murmurs, no rubs, no clicks.  LUNGS: Clear to auscultation bilaterally. No rales or rhonchi. No wheezes.  ABDOMEN: Soft, flat, nontender, nondistended. Has good bowel sounds. No hepatosplenomegaly appreciated.  EXTREMITIES: No evidence of any cyanosis, clubbing, or peripheral edema.  ,  Left  lower extremity BKA NEUROLOGIC: The patient is alert, awake, and oriented to person SKIN: Moist and warm with no rashes appreciated.  Psych: Not anxious, depressed LN: No inguinal LN enlargement    Antibiotics   Anti-infectives (From admission, onward)   Start     Dose/Rate Route Frequency Ordered Stop   04/21/18 2000  vancomycin (VANCOCIN) IVPB 1000 mg/200 mL premix  Status:  Discontinued     1,000 mg 200 mL/hr over 60 Minutes Intravenous Every 24 hours 04/20/18 1646 04/21/18 0249   04/21/18 2000  nafcillin 2 g in sodium chloride 0.9 % 100 mL IVPB     2 g 200 mL/hr over 30 Minutes Intravenous Every 4 hours 04/21/18 1835     04/21/18 0600  ceFAZolin (ANCEF) IVPB 2g/100 mL premix  Status:  Discontinued     2 g 200 mL/hr over 30 Minutes Intravenous Every 8 hours 04/21/18 0251 04/21/18 1835   04/20/18 2300  piperacillin-tazobactam (ZOSYN) IVPB 3.375 g  Status:  Discontinued     3.375 g 12.5 mL/hr over 240 Minutes Intravenous Every 8 hours 04/20/18 1646 04/21/18 0249   04/20/18 2000  vancomycin (VANCOCIN) IVPB 750 mg/150 ml premix     750 mg 150 mL/hr over 60 Minutes Intravenous  Once 04/20/18 1646 04/20/18 2255   04/20/18 1500  vancomycin (VANCOCIN) IVPB 1000 mg/200 mL premix     1,000 mg 200 mL/hr over 60 Minutes Intravenous  Once 04/20/18  1457 04/20/18 1627   04/20/18 1500  piperacillin-tazobactam (ZOSYN) IVPB 3.375 g     3.375 g 100 mL/hr over 30 Minutes Intravenous  Once 04/20/18 1457 04/20/18 1559      Medications   Scheduled Meds: . amiodarone  150 mg Intravenous Once  . amiodarone  400 mg Oral BID  . aspirin EC  81 mg Oral Daily  . atorvastatin  80 mg Oral Q2000  . clopidogrel  75 mg Oral Daily  . enoxaparin (LOVENOX) injection  65 mg Subcutaneous Q12H  . famotidine  40 mg Oral QPM  . FLUoxetine  20 mg Oral Daily  . insulin aspart  0-15 Units Subcutaneous TID WC  . insulin aspart  0-5 Units Subcutaneous QHS  . insulin glargine  46 Units Subcutaneous Daily  . levothyroxine  88 mcg Oral Q0600  . mouth rinse  15 mL Mouth Rinse BID  . mirabegron ER  25 mg Oral Daily  . mometasone-formoterol  2 puff Inhalation BID  . montelukast  10 mg Oral Daily  . morphine  30 mg Oral Q12H  . oxyCODONE-acetaminophen  1 tablet Oral 5 X Daily  . QUEtiapine  25 mg Oral Q24H  . sodium chloride flush  10-40 mL Intracatheter Q12H  . tiotropium  1 capsule Inhalation Daily  . traZODone  150 mg Oral QHS   Continuous Infusions: . sodium chloride 10 mL/hr at 04/24/18 0630  . nafcillin IV 2 g (04/24/18 0923)   PRN Meds:.sodium chloride, acetaminophen **OR** acetaminophen, albuterol, ALPRAZolam, hydrALAZINE, ondansetron **OR** ondansetron (ZOFRAN) IV, sodium chloride flush   Data Review:   Micro Results Recent Results (from the past 240 hour(s))  Blood Culture (routine x 2)     Status: Abnormal   Collection Time: 04/20/18  2:46 PM  Result Value Ref Range Status   Specimen Description   Final    BLOOD BLOOD RIGHT FOREARM Performed at [facility], [address], [place], [place] [address]    Special Requests   Final    BOTTLES DRAWN AEROBIC AND ANAEROBIC  Blood Culture results may not be optimal due to an excessive volume of blood received in culture bottles Performed at [facility], [address]  [place], [place] [address]    Culture  Setup Time   Final    GRAM POSITIVE COCCI IN BOTH AEROBIC AND ANAEROBIC BOTTLES CRITICAL RESULT CALLED TO, READ BACK BY AND VERIFIED WITH: SHEEMA HALLAJI 04/21/2018 222 REC Performed at [facility], [address], [place], [place] [address]    Culture STAPHYLOCOCCUS AUREUS (A)  Final   Report Status 04/23/2018 FINAL  Final   Organism ID, Bacteria STAPHYLOCOCCUS AUREUS  Final      Susceptibility   Staphylococcus aureus - MIC*    CIPROFLOXACIN >=8 RESISTANT Resistant     ERYTHROMYCIN >=8 RESISTANT Resistant     GENTAMICIN <=0.5 SENSITIVE Sensitive     OXACILLIN <=0.25 SENSITIVE Sensitive     TETRACYCLINE <=1 SENSITIVE Sensitive     VANCOMYCIN 1 SENSITIVE Sensitive     TRIMETH/SULFA <=10 SENSITIVE Sensitive     CLINDAMYCIN >=8 RESISTANT Resistant     RIFAMPIN <=0.5 SENSITIVE Sensitive     Inducible Clindamycin NEGATIVE Sensitive     * STAPHYLOCOCCUS AUREUS  Blood Culture (routine x 2)     Status: Abnormal   Collection Time: 04/20/18  2:46 PM  Result Value Ref Range Status   Specimen Description   Final    BLOOD LEFT ANTECUBITAL Performed at [facility], [address], [place], [place] [address]    Special Requests   Final    BOTTLES DRAWN AEROBIC AND ANAEROBIC Blood Culture adequate volume Performed at [facility], [address] [place], [place] [address]    Culture  Setup Time   Final    GRAM POSITIVE COCCI IN BOTH AEROBIC AND ANAEROBIC BOTTLES CRITICAL VALUE NOTED.  VALUE IS CONSISTENT WITH PREVIOUSLY REPORTED AND CALLED VALUE. Performed at [facility], [address] [place], [place] [address]    Culture (A)  Final    STAPHYLOCOCCUS AUREUS SUSCEPTIBILITIES PERFORMED ON PREVIOUS CULTURE WITHIN THE LAST 5 DAYS. Performed at [facility], [address] [place], [place] [address]    Report Status 04/23/2018 FINAL  Final  Urine culture     Status: Abnormal   Collection Time: 04/20/18  2:46  PM  Result Value Ref Range Status   Specimen Description   Final    URINE, RANDOM Performed at [facility], [address] [place], [place] [address]    Special Requests   Final    NONE Performed at [facility], [address] [place], [place] [address]    Culture >=100,000 COLONIES/mL STAPHYLOCOCCUS AUREUS (A)  Final   Report Status 04/22/2018 FINAL  Final   Organism ID, Bacteria STAPHYLOCOCCUS AUREUS (A)  Final      Susceptibility   Staphylococcus aureus - MIC*    CIPROFLOXACIN >=8 RESISTANT Resistant     GENTAMICIN <=0.5 SENSITIVE Sensitive     NITROFURANTOIN <=16 SENSITIVE Sensitive     OXACILLIN <=0.25 SENSITIVE Sensitive     TETRACYCLINE >=16 RESISTANT Resistant     VANCOMYCIN 1 SENSITIVE Sensitive     TRIMETH/SULFA <=10 SENSITIVE Sensitive     CLINDAMYCIN >=8 RESISTANT Resistant     RIFAMPIN <=0.5 SENSITIVE Sensitive     Inducible Clindamycin NEGATIVE Sensitive     * >=100,000 COLONIES/mL STAPHYLOCOCCUS AUREUS  Blood Culture ID Panel (Reflexed)     Status: Abnormal   Collection Time: 04/20/18  2:46 PM  Result Value Ref Range Status   Enterococcus species NOT DETECTED NOT DETECTED Final   Listeria monocytogenes NOT DETECTED NOT DETECTED Final   Staphylococcus species DETECTED (A) NOT DETECTED Final    Comment: CRITICAL RESULT CALLED TO, READ BACK BY AND VERIFIED WITH: SHEEMA HALLAJI 04/21/2018 222 REC    Staphylococcus aureus (BCID) DETECTED (A) NOT DETECTED Final    Comment: Methicillin (oxacillin) susceptible Staphylococcus aureus (MSSA). Preferred therapy is anti staphylococcal beta lactam antibiotic (Cefazolin or Nafcillin), unless clinically contraindicated. CRITICAL RESULT CALLED TO, READ BACK BY AND VERIFIED WITH: SHEEMA HALLAJI 04/21/2018 222 REC    Methicillin resistance NOT DETECTED NOT DETECTED Final   Streptococcus species NOT DETECTED NOT DETECTED Final   Streptococcus agalactiae NOT DETECTED NOT DETECTED Final   Streptococcus  pneumoniae NOT DETECTED NOT DETECTED Final   Streptococcus pyogenes NOT DETECTED NOT DETECTED Final   Acinetobacter baumannii NOT DETECTED NOT DETECTED Final   Enterobacteriaceae species NOT DETECTED NOT DETECTED Final   Enterobacter cloacae complex NOT DETECTED NOT DETECTED Final   Escherichia coli NOT DETECTED NOT DETECTED Final   Klebsiella oxytoca NOT DETECTED NOT DETECTED Final   Klebsiella pneumoniae NOT DETECTED NOT DETECTED Final   Proteus species NOT DETECTED NOT DETECTED Final   Serratia marcescens NOT DETECTED NOT DETECTED Final   Haemophilus influenzae NOT DETECTED NOT DETECTED Final   Neisseria meningitidis NOT DETECTED NOT DETECTED Final   Pseudomonas aeruginosa NOT DETECTED NOT DETECTED Final   Candida albicans NOT DETECTED NOT DETECTED Final   Candida glabrata NOT DETECTED NOT DETECTED Final   Candida krusei NOT DETECTED NOT DETECTED Final   Candida parapsilosis NOT DETECTED NOT DETECTED Final   Candida tropicalis NOT DETECTED NOT DETECTED Final    Comment: Performed at [facility], [address] [place], [place] [address]  Novel Coronavirus, NAA (hospital order; send-out to ref lab)     Status: None   Collection Time: 04/20/18  4:03 PM  Result Value Ref Range Status   SARS-CoV-2, NAA NOT DETECTED NOT DETECTED Final    Comment: Negative (Not Detected) results do not exclude infection caused by SARS CoV 2 and should not be used as the sole basis for treatment or other patient management decisions. Optimum specimen types and timing for peak viral levels during infections caused  by SARS CoV 2 have not been determined. Collection of multiple specimens (types and time points) from the same patient may be necessary to detect the virus. Improper specimen collection and handling, sequence variability underlying assay primers and or probes, or the presence of organisms in  quantities less than the limit of detection of the assay may lead to false negative  results. Positive and negative predictive values of testing are highly dependent on prevalence. False negative results are more likely when prevalence of disease is high. (NOTE) The expected result is Negative (Not Detected). The SARS CoV 2 test is intended for the presumptive qualitative  detection of nucleic acid from SARS CoV 2 in upper and lower  respir atory specimens. Testing methodology is real time RT PCR. Test results must be correlated with clinical presentation and  evaluated in the context of other laboratory and epidemiologic data.  Test performance can be affected because the epidemiology and  clinical spectrum of infection caused by SARS CoV 2 is not fully  known. For example, the optimum types of specimens to collect and  when during the course of infection these specimens are most likely  to contain detectable viral RNA may not  be known. This test has not been Food and Drug Administration (FDA) cleared or  approved and has been authorized by FDA under an Emergency Use  Authorization (EUA). The test is only authorized for the duration of  the declaration that circumstances exist justifying the authorization  of emergency use of in vitro diagnostic tests for detection and or  diagnosis of SARS CoV 2 under Section 564(b)(1) of the Act, 21 U.S.C.  section 386 195 7152 3(b)(1), unless the authorization is terminated or   revoked sooner. [facility] is certified under the  Clinical Laboratory Improvement Amendments of 1988 (CLIA), 42 U.S.C.  section [phone], to perform high complexity tests. Performed at [facility] CLIA [id number] [address], [place], [place] [address] Laboratory Director: Turner Daniels, MD Performed at [facility], [address] [place], [place] [address]    Coronavirus Source NASOPHARYNGEAL  Final    Comment: Performed at [facility], [address] [place], [place] [address]   MRSA PCR Screening     Status: None   Collection Time: 04/20/18  7:04 PM  Result Value Ref Range Status   MRSA by PCR NEGATIVE NEGATIVE Final    Comment:        The GeneXpert MRSA Assay (FDA approved for NASAL specimens only), is one component of a comprehensive MRSA colonization surveillance program. It is not intended to diagnose MRSA infection nor to guide or monitor treatment for MRSA infections. Performed at [facility], [address] [place], [place] [address]   Culture, blood (Routine X 2) w Reflex to ID Panel     Status: None (Preliminary result)   Collection Time: 04/23/18  2:53 AM  Result Value Ref Range Status   Specimen Description BLOOD RIGHT HAND  Final   Special Requests   Final    BOTTLES DRAWN AEROBIC AND ANAEROBIC Blood Culture results may not be optimal due to an excessive volume of blood received in culture bottles   Culture   Final    NO GROWTH 1 DAY Performed at [facility], [address] [place], [place] [address]    Report Status PENDING  Incomplete  Culture, blood (Routine X 2) w Reflex to ID Panel     Status: None (Preliminary result)   Collection Time: 04/23/18  2:55 AM  Result Value Ref Range Status   Specimen Description BLOOD RIGHT WRIST  Final   Special Requests   Final    BOTTLES DRAWN AEROBIC AND ANAEROBIC Blood Culture adequate volume   Culture   Final    NO GROWTH 1 DAY Performed at [facility], [address], [place], [place] [address]    Report Status PENDING  Incomplete    Radiology Reports Dg Chest Port 1 View  Result Date: 04/22/2018 CLINICAL DATA:  Acute respiratory failure.  History of CHF and COPD. EXAM: PORTABLE CHEST 1 VIEW COMPARISON:  Chest radiograph April 20, 2018 FINDINGS: Cardiac silhouette is mildly enlarged. Coronary artery stent. Calcified aortic arch. Worsening retrocardiac airspace opacity with small LEFT pleural effusion. Persistent small LEFT upper lobe nodular density. New  bandlike density RIGHT lung base. Mild chronic interstitial changes. No pneumothorax. Soft tissue planes and included osseous structures are non suspicious. Multiple EKG lines overlie the patient and may obscure subtle underlying pathology. IMPRESSION: 1. Worsening retrocardiac consolidation with small pleural effusion. New RIGHT lung base atelectasis, less likely pneumonia. Followup PA and lateral chest X-ray is recommended in 3-4 weeks  following trial of antibiotic therapy to ensure resolution and exclude underlying malignancy. 2. Stable nodular density LEFT upper lobe. 3. Mild cardiomegaly. Electronically Signed   By: Awilda Metroourtnay  Bloomer M.D.   On: 04/22/2018 03:58   Dg Chest Port 1 View  Result Date: 04/20/2018 CLINICAL DATA:  Fever, multiple complaint EXAM: PORTABLE CHEST 1 VIEW COMPARISON:  01/14/2013 FINDINGS: Mild bilateral interstitial thickening. Hazy left lower lobe airspace disease. Small area of airspace disease in the left upper lobe. Possible small left pleural effusion. No right pleural effusion. No pneumothorax. Stable cardiomediastinal silhouette. Thoracic aortic atherosclerosis. No acute osseous abnormality. IMPRESSION: 1. Hazy left lower lobe airspace disease. Small area of airspace disease in the left upper lobe. Findings are concerning for an infectious etiology. Electronically Signed   By: Elige KoHetal  Baylin Gamblin   On: 04/20/2018 15:08   Ct Renal Stone Study  Result Date: 04/20/2018 CLINICAL DATA:  58 year old female with abdominal pain EXAM: CT ABDOMEN AND PELVIS WITHOUT CONTRAST TECHNIQUE: Multidetector CT imaging of the abdomen and pelvis was performed following the standard protocol without IV contrast. COMPARISON:  None. FINDINGS: Lower chest: Respiratory motion limits evaluation. Atelectasis at the left lung base. Hepatobiliary: Cranial caudal span of the right liver measures greater than 18 cm. No focal lesion or nodular contour. Cholecystectomy. No intrahepatic or extrahepatic biliary ductal  dilatation. Pancreas: Unremarkable pancreas Spleen: Unremarkable spleen Adrenals/Urinary Tract: Unremarkable adrenal glands. Right kidney demonstrates regions of cortical thinning. No hydronephrosis. Vascular calcifications in the hilum of the right kidney. Unremarkable course of the right ureter. Left kidney demonstrates no hydronephrosis. Likely nonobstructive stone in the inferior collecting system, versus vascular calcifications. Unremarkable course the left ureter. Urinary bladder partially distended. Stomach/Bowel: Unremarkable stomach. Unremarkable small bowel. Moderate to large formed stool burden. No focal inflammatory changes. Normal appendix. Vascular/Lymphatic: Vascular calcifications of the aorta and bilateral iliac arteries. Venous collaterals on the anterior pelvic wall favored to reflect chronic left iliac main stenosis/occlusion. Reproductive: Unremarkable appearance of uterus. Other: Hazy edema/infiltration of the musculature associated with the left eye fragment a cruise. There are small gas locules present. This is in continuity with the anterior aspect of the L1-L2 disc space, which appears relatively widened compared to adjacent disc spaces. Musculoskeletal: Vacuum disc phenomenon at T12-L1. Compression fracture of L1. Irregularity of the inferior endplate with questionable disc space widening of L1-L2. Ill-defined soft tissue extends from the anterior aspect of the L1-L2 disc space into the left eye frag medic cruise. Degenerative changes of the lower lumbar spine. Vacuum disc phenomenon at L4-L5. IMPRESSION: Soft tissue changes of the left diaphragmatic cruz with small gas locules concerning for infection with gas-forming organism. This is in continuity with the anterior aspect of the L1-L2 disc space, and the source may be secondary to discitis/osteomyelitis at this site. Evaluation with MRI with contrast may be useful. Compression fracture of L1 of indeterminate age. These results were  discussed by telephone at the time of interpretation on 04/20/2018 at 4:10 pm with Dr. Sharma CovertNorman. Aortic Atherosclerosis (ICD10-I70.0). 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 Correlation with a prior history of DVT may be useful. Hepatomegaly. Electronically Signed   By: Gilmer MorJaime  Wagner D.O.   On: 04/20/2018 16:14   [place] Ekg Site Rite  Result Date: 04/21/2018 If Site Rite image not attached, placement could not be confirmed due to current cardiac rhythm.    CBC Recent Labs  Lab 04/20/18 1429 04/21/18 0355 04/22/18 0217 04/23/18 0255 04/24/18 0514  WBC 22.0* 20.3* 21.2* 20.7* 17.5*  HGB 13.0 13.4  12.6 12.4 10.9*  HCT 39.8 41.2 40.6 39.4 33.8*  PLT 320 330 352 400 333  MCV 76.8* 76.3* 79.1* 77.1* 76.8*  MCH 25.1* 24.8* 24.6* 24.3* 24.8*  MCHC 32.7 32.5 31.0 31.5 32.2  RDW 18.4* 18.5* 18.4* 18.8* 19.2*  LYMPHSABS 0.7  --   --   --  1.3  MONOABS 0.8  --   --   --  0.7  EOSABS 0.2  --   --   --  0.2  BASOSABS 0.1  --   --   --  0.0    Chemistries  Recent Labs  Lab 04/20/18 1429  04/21/18 1200 04/21/18 1534 04/22/18 0217 04/23/18 0255 04/24/18 0514  NA 124*   < > 131* 131* 136 141 136  K 5.0   < > 3.9 3.7 4.6 3.4* 3.6  CL 88*   < > 93* 94* 97* 104 101  CO2 25   < > GLUCOSE 650*   < > 315* 278* 156* 72 95  BUN 62*   < > 40* 40* 44* 33* 33*  CREATININE 1.16*   < > 0.85 0.72 0.87 0.71 0.70  CALCIUM 8.9   < > 9.0 9.0 9.0 8.3* 7.7*  MG  --   --   --   --  2.1 2.0  --   AST 21  --   --   --   --   --   --   ALT 22  --   --   --   --   --   --   ALKPHOS 152*  --   --   --   --   --   --   BILITOT 0.7  --   --   --   --   --   --    < > = values in this interval not displayed.   ------------------------------------------------------------------------------------------------------------------ estimated creatinine clearance is 71.8 mL/min (by C-G formula based on SCr of 0.7  mg/dL). ------------------------------------------------------------------------------------------------------------------ No results for input(s): HGBA1C in the last 72 hours. ------------------------------------------------------------------------------------------------------------------ No results for input(s): CHOL, HDL, LDLCALC, TRIG, CHOLHDL, LDLDIRECT in the last 72 hours. ------------------------------------------------------------------------------------------------------------------ No results for input(s): TSH, T4TOTAL, T3FREE, THYROIDAB in the last 72 hours.  Invalid input(s): FREET3 ------------------------------------------------------------------------------------------------------------------ No results for input(s): VITAMINB12, FOLATE, FERRITIN, TIBC, IRON, RETICCTPCT in the last 72 hours.  Coagulation profile Recent Labs  Lab 04/20/18 1654 04/21/18 0355 04/21/18 1534  INR >10.0* >10.0* 1.8*    No results for input(s): DDIMER in the last 72 hours.  Cardiac Enzymes Recent Labs  Lab 04/22/18 1441 04/22/18 2145 04/23/18 0255  TROPONINI 26.42* 26.52* 22.80*   ------------------------------------------------------------------------------------------------------------------ Invalid input(s): POCBNP    Assessment & Plan   58 year old female with past medical history of diabetes, hypertension, diabetic neuropathy, COPD with ongoing tobacco abuse, peripheral vascular disease status post left BKA, previous history of nephrolithiasis, history of DVT, chronic back pain who presents to the hospital due to back pain, shortness of breath, weakness   1.  MSSA bacteremia with L1-L2 discitis likely source right great toe ulcer Continue nafcillin, TTE is pending May need TEE COVID test negative  2.  A. fib with RVR heart rate stable appreciate cardiology input continue amiodarone, continue Lovenox Heart rate stable  3.  COPD without acute exacerbation continue current  therapy patient stable from respiratory standpoint  4.  Acute kidney injury resolved with IV hydration  5.  Elevated troponin further management per cardiology  6.  Diabetes type 2 continue Lovenox, and sliding scale insulin  7.  Hyperlipidemia continue Lipitor  8.  Previous history of stroke continue Plavix       Code Status Orders  (From admission, onward)         Start     Ordered   04/20/18 1925  Full code  Continuous     04/20/18 1926        Code Status History    Date Active Date Inactive Code Status Order ID Comments User Context   04/20/2018 1903 04/20/2018 1926 Full Code [id number]  Houston Siren, MD Inpatient    Advance Directive Documentation     Most Recent Value  Type of Advance Directive  Living will  Pre-existing out of facility DNR order (yellow form or pink MOST form)  -  "MOST" Form in Place?  -           Consults cardiology pulmonary critical care  DVT Prophylaxis Lovenox  Lab Results  Component Value Date   PLT 333 04/24/2018     Time Spent in minutes   Greater than 50% of time spent in care coordination and counseling patient regarding the condition and plan of care.   Auburn Bilberry M.D on 04/24/2018 at 11:49 AM  Between 7am to 6pm - Pager - [phone]  After 6pm go to [email or web address] - Social research officer, government  [employer]   Office  [phone]

## 2018-04-24 NOTE — Progress Notes (Signed)
MD notified. MRI notifies RN of MRI which was placed few days ago. MD orders RN to discontinue the order for now. I will continue to assess.

## 2018-04-25 ENCOUNTER — Inpatient Hospital Stay
Admit: 2018-04-25 | Discharge: 2018-04-25 | Disposition: A | Discharge status: Home / Self Care | Payer: Medicare Other | Attending: Cardiology | Admitting: Cardiology

## 2018-04-25 ENCOUNTER — Inpatient Hospital Stay: Payer: Medicare Other

## 2018-04-25 DIAGNOSIS — Z8631 Personal history of diabetic foot ulcer: Secondary | ICD-10-CM

## 2018-04-25 DIAGNOSIS — Z720 Tobacco use: Secondary | ICD-10-CM

## 2018-04-25 DIAGNOSIS — E1151 Type 2 diabetes mellitus with diabetic peripheral angiopathy without gangrene: Secondary | ICD-10-CM

## 2018-04-25 LAB — ECHOCARDIOGRAM COMPLETE
Height: 63 in
Weight: 2456 oz

## 2018-04-25 LAB — CBC
HCT: 33.6 % — ABNORMAL LOW (ref 36.0–46.0)
Hemoglobin: 10.7 g/dL — ABNORMAL LOW (ref 12.0–15.0)
MCH: 24.5 pg — ABNORMAL LOW (ref 26.0–34.0)
MCHC: 31.8 g/dL (ref 30.0–36.0)
MCV: 77.1 fL — ABNORMAL LOW (ref 80.0–100.0)
Platelets: 349 10*3/uL (ref 150–400)
RBC: 4.36 MIL/uL (ref 3.87–5.11)
RDW: 19.7 % — ABNORMAL HIGH (ref 11.5–15.5)
WBC: 15.3 10*3/uL — ABNORMAL HIGH (ref 4.0–10.5)
nRBC: 0 % (ref 0.0–0.2)

## 2018-04-25 LAB — GLUCOSE, CAPILLARY
Glucose-Capillary: 118 mg/dL — ABNORMAL HIGH (ref 70–99)
Glucose-Capillary: 160 mg/dL — ABNORMAL HIGH (ref 70–99)
Glucose-Capillary: 169 mg/dL — ABNORMAL HIGH (ref 70–99)
Glucose-Capillary: 71 mg/dL (ref 70–99)

## 2018-04-25 MED ORDER — SENNOSIDES-DOCUSATE SODIUM 8.6-50 MG PO TABS
1.0000 | ORAL_TABLET | Freq: Two times a day (BID) | ORAL | Status: DC
  Administered 2018-04-25 – 2018-04-27 (×5): 1 via ORAL
  Filled 2018-04-25 (×5): qty 1

## 2018-04-25 MED ORDER — COLLAGENASE 250 UNIT/GM EX OINT
TOPICAL_OINTMENT | Freq: Every day | CUTANEOUS | Status: DC
  Administered 2018-04-25 – 2018-04-27 (×2): via TOPICAL
  Filled 2018-04-25: qty 30

## 2018-04-25 MED ORDER — GADOBUTROL 1 MMOL/ML IV SOLN
7.0000 mL | Freq: Once | INTRAVENOUS | Status: AC | PRN
  Administered 2018-04-25: 7 mL via INTRAVENOUS

## 2018-04-25 NOTE — Progress Notes (Signed)
*  PRELIMINARY RESULTS* Echocardiogram 2D Echocardiogram has been performed.  Cristela Blue 04/25/2018, 8:32 AM

## 2018-04-25 NOTE — Care Management Important Message (Signed)
Important Message  Patient Details  Name: MARLYSS CALDERONE MRN: [id number] Date of Birth: 02-17-1960   Medicare Important Message Given:  Yes  Needed initial signed. Not done upon admission due to Covid 19 results pending.  Reviewed with patient verbally, aware of right.  Gave consent for this staff member to sign electronic form.  Copy left in room for reference.    Johnell Comings 04/25/2018, 10:55 AM

## 2018-04-25 NOTE — Progress Notes (Signed)
[employer] [facility]                                                                                                                                                                                  Patient Demographics   Alexandra Goodman, is a 58 y.o. female, DOB - [birth year], ZYS:[id number]  Admit date - 04/20/2018   Admitting Physician Houston Siren, MD  Outpatient Primary MD for the patient is Kimel-Scott, Clydie Braun, MD   LOS - 5  Subjective: Patient states that she wants to go home.  Denies any complaints   Review of Systems:   CONSTITUTIONAL: No documented fever. No fatigue, weakness. No weight gain, no weight loss.  EYES: No blurry or double vision.  ENT: No tinnitus. No postnasal drip. No redness of the oropharynx.  RESPIRATORY: No cough, no wheeze, no hemoptysis. No dyspnea.  CARDIOVASCULAR: No chest pain. No orthopnea. No palpitations. No syncope.  GASTROINTESTINAL: No nausea, no vomiting or diarrhea. No abdominal pain. No melena or hematochezia.  GENITOURINARY: No dysuria or hematuria.  ENDOCRINE: No polyuria or nocturia. No heat or cold intolerance.  HEMATOLOGY: No anemia. No bruising. No bleeding.  INTEGUMENTARY: No rashes. No lesions.  MUSCULOSKELETAL: No arthritis. No swelling. No gout.  NEUROLOGIC: No numbness, tingling, or ataxia. No seizure-type activity.  PSYCHIATRIC: No anxiety. No insomnia. No ADD.    Vitals:   Vitals:   04/24/18 1547 04/24/18 1941 04/25/18 0432 04/25/18 0812  BP: 98/84 100/86 (!) 108/50 (!) 109/55  Pulse: 98 82 92 88  Resp: 20 18 16 18   Temp: 98.3 F (36.8 C) 98.7 F (37.1 C) 98.9 F (37.2 C) 99 F (37.2 C)  TempSrc:  Oral Oral Oral  SpO2: 90% 93% 91% 91%  Weight:   69.6 kg   Height:        Wt Readings from Last 3 Encounters:  04/25/18 69.6 kg     Intake/Output Summary (Last 24 hours) at 04/25/2018 1224 Last data filed at 04/25/2018 1003 Gross per 24 hour  Intake 955.38 ml  Output 1300 ml  Net -344.62  ml    Physical Exam:   GENERAL: Pleasant-appearing in no apparent distress.  HEAD, EYES, EARS, NOSE AND THROAT: Atraumatic, normocephalic. Extraocular muscles are intact. Pupils equal and reactive to light. Sclerae anicteric. No conjunctival injection. No oro-pharyngeal erythema.  NECK: Supple. There is no jugular venous distention. No bruits, no lymphadenopathy, no thyromegaly.  HEART: Regular rate and rhythm,. No murmurs, no rubs, no clicks.  LUNGS: Clear to auscultation bilaterally. No rales or rhonchi. No wheezes.  ABDOMEN: Soft, flat, nontender, nondistended. Has good bowel sounds. No hepatosplenomegaly appreciated.  EXTREMITIES: No evidence of  any cyanosis, clubbing, or peripheral edema.  ,  Left lower extremity BKA NEUROLOGIC: The patient is alert, awake, and oriented to person SKIN: Moist and warm with no rashes appreciated.  Psych: Not anxious, depressed LN: No inguinal LN enlargement    Antibiotics   Anti-infectives (From admission, onward)   Start     Dose/Rate Route Frequency Ordered Stop   04/21/18 2000  vancomycin (VANCOCIN) IVPB 1000 mg/200 mL premix  Status:  Discontinued     1,000 mg 200 mL/hr over 60 Minutes Intravenous Every 24 hours 04/20/18 1646 04/21/18 0249   04/21/18 2000  nafcillin 2 g in sodium chloride 0.9 % 100 mL IVPB     2 g 200 mL/hr over 30 Minutes Intravenous Every 4 hours 04/21/18 1835     04/21/18 0600  ceFAZolin (ANCEF) IVPB 2g/100 mL premix  Status:  Discontinued     2 g 200 mL/hr over 30 Minutes Intravenous Every 8 hours 04/21/18 0251 04/21/18 1835   04/20/18 2300  piperacillin-tazobactam (ZOSYN) IVPB 3.375 g  Status:  Discontinued     3.375 g 12.5 mL/hr over 240 Minutes Intravenous Every 8 hours 04/20/18 1646 04/21/18 0249   04/20/18 2000  vancomycin (VANCOCIN) IVPB 750 mg/150 ml premix     750 mg 150 mL/hr over 60 Minutes Intravenous  Once 04/20/18 1646 04/20/18 2255   04/20/18 1500  vancomycin (VANCOCIN) IVPB 1000 mg/200 mL premix      1,000 mg 200 mL/hr over 60 Minutes Intravenous  Once 04/20/18 1457 04/20/18 1627   04/20/18 1500  piperacillin-tazobactam (ZOSYN) IVPB 3.375 g     3.375 g 100 mL/hr over 30 Minutes Intravenous  Once 04/20/18 1457 04/20/18 1559      Medications   Scheduled Meds: . amiodarone  150 mg Intravenous Once  . amiodarone  400 mg Oral BID  . aspirin EC  81 mg Oral Daily  . atorvastatin  80 mg Oral Q2000  . clopidogrel  75 mg Oral Daily  . enoxaparin (LOVENOX) injection  65 mg Subcutaneous Q12H  . famotidine  40 mg Oral QPM  . FLUoxetine  20 mg Oral Daily  . insulin aspart  0-15 Units Subcutaneous TID WC  . insulin aspart  0-5 Units Subcutaneous QHS  . insulin glargine  46 Units Subcutaneous Daily  . levothyroxine  88 mcg Oral Q0600  . mouth rinse  15 mL Mouth Rinse BID  . mirabegron ER  25 mg Oral Daily  . mometasone-formoterol  2 puff Inhalation BID  . montelukast  10 mg Oral Daily  . morphine  30 mg Oral Q12H  . oxyCODONE-acetaminophen  1 tablet Oral 5 X Daily  . QUEtiapine  25 mg Oral Q24H  . sodium chloride flush  10-40 mL Intracatheter Q12H  . tiotropium  1 capsule Inhalation Daily  . traZODone  150 mg Oral QHS   Continuous Infusions: . sodium chloride 500 mL (04/25/18 0424)  . nafcillin IV 2 g (04/25/18 0914)   PRN Meds:.sodium chloride, acetaminophen **OR** acetaminophen, albuterol, ALPRAZolam, hydrALAZINE, ondansetron **OR** ondansetron (ZOFRAN) IV, sodium chloride flush   Data Review:   Micro Results Recent Results (from the past 240 hour(s))  Blood Culture (routine x 2)     Status: Abnormal   Collection Time: 04/20/18  2:46 PM  Result Value Ref Range Status   Specimen Description   Final    BLOOD BLOOD RIGHT FOREARM Performed at [facility], [address], [place], [place] [address]    Special Requests  Final    BOTTLES DRAWN AEROBIC AND ANAEROBIC Blood Culture results may not be optimal due to an excessive volume of blood received in culture  bottles Performed at [facility], [address] [place], [place] [address]    Culture  Setup Time   Final    GRAM POSITIVE COCCI IN BOTH AEROBIC AND ANAEROBIC BOTTLES CRITICAL RESULT CALLED TO, READ BACK BY AND VERIFIED WITH: SHEEMA HALLAJI 04/21/2018 222 REC Performed at [facility], [address], [place], [place] [address]    Culture STAPHYLOCOCCUS AUREUS (A)  Final   Report Status 04/23/2018 FINAL  Final   Organism ID, Bacteria STAPHYLOCOCCUS AUREUS  Final      Susceptibility   Staphylococcus aureus - MIC*    CIPROFLOXACIN >=8 RESISTANT Resistant     ERYTHROMYCIN >=8 RESISTANT Resistant     GENTAMICIN <=0.5 SENSITIVE Sensitive     OXACILLIN <=0.25 SENSITIVE Sensitive     TETRACYCLINE <=1 SENSITIVE Sensitive     VANCOMYCIN 1 SENSITIVE Sensitive     TRIMETH/SULFA <=10 SENSITIVE Sensitive     CLINDAMYCIN >=8 RESISTANT Resistant     RIFAMPIN <=0.5 SENSITIVE Sensitive     Inducible Clindamycin NEGATIVE Sensitive     * STAPHYLOCOCCUS AUREUS  Blood Culture (routine x 2)     Status: Abnormal   Collection Time: 04/20/18  2:46 PM  Result Value Ref Range Status   Specimen Description   Final    BLOOD LEFT ANTECUBITAL Performed at [facility], [address], [place], [place] [address]    Special Requests   Final    BOTTLES DRAWN AEROBIC AND ANAEROBIC Blood Culture adequate volume Performed at [facility], [address] [place], [place] [address]    Culture  Setup Time   Final    GRAM POSITIVE COCCI IN BOTH AEROBIC AND ANAEROBIC BOTTLES CRITICAL VALUE NOTED.  VALUE IS CONSISTENT WITH PREVIOUSLY REPORTED AND CALLED VALUE. Performed at [facility], [address] [place], [place] [address]    Culture (A)  Final    STAPHYLOCOCCUS AUREUS SUSCEPTIBILITIES PERFORMED ON PREVIOUS CULTURE WITHIN THE LAST 5 DAYS. Performed at [facility], [address] [place], [place] [address]    Report Status 04/23/2018 FINAL  Final   Urine culture     Status: Abnormal   Collection Time: 04/20/18  2:46 PM  Result Value Ref Range Status   Specimen Description   Final    URINE, RANDOM Performed at [facility], [address] [place], [place] [address]    Special Requests   Final    NONE Performed at [facility], [address] [place], [place] [address]    Culture >=100,000 COLONIES/mL STAPHYLOCOCCUS AUREUS (A)  Final   Report Status 04/22/2018 FINAL  Final   Organism ID, Bacteria STAPHYLOCOCCUS AUREUS (A)  Final      Susceptibility   Staphylococcus aureus - MIC*    CIPROFLOXACIN >=8 RESISTANT Resistant     GENTAMICIN <=0.5 SENSITIVE Sensitive     NITROFURANTOIN <=16 SENSITIVE Sensitive     OXACILLIN <=0.25 SENSITIVE Sensitive     TETRACYCLINE >=16 RESISTANT Resistant     VANCOMYCIN 1 SENSITIVE Sensitive     TRIMETH/SULFA <=10 SENSITIVE Sensitive     CLINDAMYCIN >=8 RESISTANT Resistant     RIFAMPIN <=0.5 SENSITIVE Sensitive     Inducible Clindamycin NEGATIVE Sensitive     * >=100,000 COLONIES/mL STAPHYLOCOCCUS AUREUS  Blood Culture ID Panel (Reflexed)     Status:  Abnormal   Collection Time: 04/20/18  2:46 PM  Result Value Ref Range Status   Enterococcus species NOT DETECTED NOT DETECTED Final   Listeria monocytogenes NOT DETECTED NOT DETECTED Final   Staphylococcus species DETECTED (A) NOT DETECTED Final    Comment: CRITICAL RESULT CALLED TO, READ BACK BY AND VERIFIED WITH: SHEEMA HALLAJI 04/21/2018 222 REC    Staphylococcus aureus (BCID) DETECTED (A) NOT DETECTED Final    Comment: Methicillin (oxacillin) susceptible Staphylococcus aureus (MSSA). Preferred therapy is anti staphylococcal beta lactam antibiotic (Cefazolin or Nafcillin), unless clinically contraindicated. CRITICAL RESULT CALLED TO, READ BACK BY AND VERIFIED WITH: SHEEMA HALLAJI 04/21/2018 222 REC    Methicillin resistance NOT DETECTED NOT DETECTED Final   Streptococcus species NOT DETECTED NOT DETECTED Final    Streptococcus agalactiae NOT DETECTED NOT DETECTED Final   Streptococcus pneumoniae NOT DETECTED NOT DETECTED Final   Streptococcus pyogenes NOT DETECTED NOT DETECTED Final   Acinetobacter baumannii NOT DETECTED NOT DETECTED Final   Enterobacteriaceae species NOT DETECTED NOT DETECTED Final   Enterobacter cloacae complex NOT DETECTED NOT DETECTED Final   Escherichia coli NOT DETECTED NOT DETECTED Final   Klebsiella oxytoca NOT DETECTED NOT DETECTED Final   Klebsiella pneumoniae NOT DETECTED NOT DETECTED Final   Proteus species NOT DETECTED NOT DETECTED Final   Serratia marcescens NOT DETECTED NOT DETECTED Final   Haemophilus influenzae NOT DETECTED NOT DETECTED Final   Neisseria meningitidis NOT DETECTED NOT DETECTED Final   Pseudomonas aeruginosa NOT DETECTED NOT DETECTED Final   Candida albicans NOT DETECTED NOT DETECTED Final   Candida glabrata NOT DETECTED NOT DETECTED Final   Candida krusei NOT DETECTED NOT DETECTED Final   Candida parapsilosis NOT DETECTED NOT DETECTED Final   Candida tropicalis NOT DETECTED NOT DETECTED Final    Comment: Performed at [facility], [address] [place], [place] [address]  Novel Coronavirus, NAA (hospital order; send-out to ref lab)     Status: None   Collection Time: 04/20/18  4:03 PM  Result Value Ref Range Status   SARS-CoV-2, NAA NOT DETECTED NOT DETECTED Final    Comment: Negative (Not Detected) results do not exclude infection caused by SARS CoV 2 and should not be used as the sole basis for treatment or other patient management decisions. Optimum specimen types and timing for peak viral levels during infections caused  by SARS CoV 2 have not been determined. Collection of multiple specimens (types and time points) from the same patient may be necessary to detect the virus. Improper specimen collection and handling, sequence variability underlying assay primers and or probes, or the presence of organisms in  quantities less than the  limit of detection of the assay may lead to false negative results. Positive and negative predictive values of testing are highly dependent on prevalence. False negative results are more likely when prevalence of disease is high. (NOTE) The expected result is Negative (Not Detected). The SARS CoV 2 test is intended for the presumptive qualitative  detection of nucleic acid from SARS CoV 2 in upper and lower  respir atory specimens. Testing methodology is real time RT PCR. Test results must be correlated with clinical presentation and  evaluated in the context of other laboratory and epidemiologic data.  Test performance can be affected because the epidemiology and  clinical spectrum of infection caused by SARS CoV 2 is not fully  known. For example, the optimum types of specimens to collect and  when during the course of infection these specimens are  most likely  to contain detectable viral RNA may not be known. This test has not been Food and Drug Administration (FDA) cleared or  approved and has been authorized by FDA under an Emergency Use  Authorization (EUA). The test is only authorized for the duration of  the declaration that circumstances exist justifying the authorization  of emergency use of in vitro diagnostic tests for detection and or  diagnosis of SARS CoV 2 under Section 564(b)(1) of the Act, 21 U.S.C.  section [phone] 3(b)(1), unless the authorization is terminated or   revoked sooner. [facility] is certified under the  Clinical Laboratory Improvement Amendments of 1988 (CLIA), 42 U.S.C.  section [phone], to perform high complexity tests. Performed at [facility] CLIA [id number] [address], [place], [place] [address] Laboratory Director: Turner Daniels, MD Performed at [facility], [address], [place], [place] [address]    Coronavirus Source NASOPHARYNGEAL  Final    Comment: Performed at [facility], [address] [place], [place] [address]  MRSA PCR Screening     Status: None   Collection Time: 04/20/18  7:04 PM  Result Value Ref Range Status   MRSA by PCR NEGATIVE NEGATIVE Final    Comment:        The GeneXpert MRSA Assay (FDA approved for NASAL specimens only), is one component of a comprehensive MRSA colonization surveillance program. It is not intended to diagnose MRSA infection nor to guide or monitor treatment for MRSA infections. Performed at [facility], [address] [place], [place] [address]   Culture, blood (Routine X 2) w Reflex to ID Panel     Status: None (Preliminary result)   Collection Time: 04/23/18  2:53 AM  Result Value Ref Range Status   Specimen Description BLOOD RIGHT HAND  Final   Special Requests   Final    BOTTLES DRAWN AEROBIC AND ANAEROBIC Blood Culture results may not be optimal due to an excessive volume of blood received in culture bottles   Culture   Final    NO GROWTH 2 DAYS Performed at [facility], [address], [place], [place] [address]    Report Status PENDING  Incomplete  Culture, blood (Routine X 2) w Reflex to ID Panel     Status: None (Preliminary result)   Collection Time: 04/23/18  2:55 AM  Result Value Ref Range Status   Specimen Description BLOOD RIGHT WRIST  Final   Special Requests   Final    BOTTLES DRAWN AEROBIC AND ANAEROBIC Blood Culture adequate volume   Culture   Final    NO GROWTH 2 DAYS Performed at [facility], [address], [place], [place] [address]    Report Status PENDING  Incomplete    Radiology Reports Dg Chest Port 1 View  Result Date: 04/22/2018 CLINICAL DATA:  Acute respiratory failure.  History of CHF and COPD. EXAM: PORTABLE CHEST 1 VIEW COMPARISON:  Chest radiograph April 20, 2018 FINDINGS: Cardiac silhouette is mildly enlarged. Coronary artery stent. Calcified aortic arch. Worsening retrocardiac airspace opacity with small LEFT pleural effusion.  Persistent small LEFT upper lobe nodular density. New bandlike density RIGHT lung base. Mild chronic interstitial changes. No pneumothorax. Soft tissue planes and included osseous structures are non suspicious. Multiple EKG lines overlie the patient and may obscure subtle underlying pathology. IMPRESSION: 1. Worsening retrocardiac consolidation with small pleural effusion. New RIGHT lung base atelectasis, less likely pneumonia. Followup  PA and lateral chest X-ray is recommended in 3-4 weeks following trial of antibiotic therapy to ensure resolution and exclude underlying malignancy. 2. Stable nodular density LEFT upper lobe. 3. Mild cardiomegaly. Electronically Signed   By: Awilda Metro M.D.   On: 04/22/2018 03:58   Dg Chest Port 1 View  Result Date: 04/20/2018 CLINICAL DATA:  Fever, multiple complaint EXAM: PORTABLE CHEST 1 VIEW COMPARISON:  01/14/2013 FINDINGS: Mild bilateral interstitial thickening. Hazy left lower lobe airspace disease. Small area of airspace disease in the left upper lobe. Possible small left pleural effusion. No right pleural effusion. No pneumothorax. Stable cardiomediastinal silhouette. Thoracic aortic atherosclerosis. No acute osseous abnormality. IMPRESSION: 1. Hazy left lower lobe airspace disease. Small area of airspace disease in the left upper lobe. Findings are concerning for an infectious etiology. Electronically Signed   By: Elige Ko   On: 04/20/2018 15:08   Ct Renal Stone Study  Result Date: 04/20/2018 CLINICAL DATA:  58 year old female with abdominal pain EXAM: CT ABDOMEN AND PELVIS WITHOUT CONTRAST TECHNIQUE: Multidetector CT imaging of the abdomen and pelvis was performed following the standard protocol without IV contrast. COMPARISON:  None. FINDINGS: Lower chest: Respiratory motion limits evaluation. Atelectasis at the left lung base. Hepatobiliary: Cranial caudal span of the right liver measures greater than 18 cm. No focal lesion or nodular contour.  Cholecystectomy. No intrahepatic or extrahepatic biliary ductal dilatation. Pancreas: Unremarkable pancreas Spleen: Unremarkable spleen Adrenals/Urinary Tract: Unremarkable adrenal glands. Right kidney demonstrates regions of cortical thinning. No hydronephrosis. Vascular calcifications in the hilum of the right kidney. Unremarkable course of the right ureter. Left kidney demonstrates no hydronephrosis. Likely nonobstructive stone in the inferior collecting system, versus vascular calcifications. Unremarkable course the left ureter. Urinary bladder partially distended. Stomach/Bowel: Unremarkable stomach. Unremarkable small bowel. Moderate to large formed stool burden. No focal inflammatory changes. Normal appendix. Vascular/Lymphatic: Vascular calcifications of the aorta and bilateral iliac arteries. Venous collaterals on the anterior pelvic wall favored to reflect chronic left iliac main stenosis/occlusion. Reproductive: Unremarkable appearance of uterus. Other: Hazy edema/infiltration of the musculature associated with the left eye fragment a cruise. There are small gas locules present. This is in continuity with the anterior aspect of the L1-L2 disc space, which appears relatively widened compared to adjacent disc spaces. Musculoskeletal: Vacuum disc phenomenon at T12-L1. Compression fracture of L1. Irregularity of the inferior endplate with questionable disc space widening of L1-L2. Ill-defined soft tissue extends from the anterior aspect of the L1-L2 disc space into the left eye frag medic cruise. Degenerative changes of the lower lumbar spine. Vacuum disc phenomenon at L4-L5. IMPRESSION: Soft tissue changes of the left diaphragmatic cruz with small gas locules concerning for infection with gas-forming organism. This is in continuity with the anterior aspect of the L1-L2 disc space, and the source may be secondary to discitis/osteomyelitis at this site. Evaluation with MRI with contrast may be useful.  Compression fracture of L1 of indeterminate age. These results were discussed by telephone at the time of interpretation on 04/20/2018 at 4:10 pm with Dr. Sharma Covert. Aortic Atherosclerosis (ICD10-I70.0). 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 Correlation with a prior history of DVT may be useful. Hepatomegaly. Electronically Signed   By: Gilmer Mor D.O.   On: 04/20/2018 16:14   [place] Ekg Site Rite  Result Date: 04/21/2018 If Site Rite image not attached, placement could not be confirmed due to current cardiac rhythm.    CBC Recent Labs  Lab 04/20/18 1429 04/21/18 0355 04/22/18 0217 04/23/18 0255 04/24/18 0514 04/25/18  0436  WBC 22.0* 20.3* 21.2* 20.7* 17.5* 15.3*  HGB 13.0 13.4 12.6 12.4 10.9* 10.7*  HCT 39.8 41.2 40.6 39.4 33.8* 33.6*  PLT 320 330 352 400 333 349  MCV 76.8* 76.3* 79.1* 77.1* 76.8* 77.1*  MCH 25.1* 24.8* 24.6* 24.3* 24.8* 24.5*  MCHC 32.7 32.5 31.0 31.5 32.2 31.8  RDW 18.4* 18.5* 18.4* 18.8* 19.2* 19.7*  LYMPHSABS 0.7  --   --   --  1.3  --   MONOABS 0.8  --   --   --  0.7  --   EOSABS 0.2  --   --   --  0.2  --   BASOSABS 0.1  --   --   --  0.0  --     Chemistries  Recent Labs  Lab 04/20/18 1429  04/21/18 1200 04/21/18 1534 04/22/18 0217 04/23/18 0255 04/24/18 0514  NA 124*   < > 131* 131* 136 141 136  K 5.0   < > 3.9 3.7 4.6 3.4* 3.6  CL 88*   < > 93* 94* 97* 104 101  CO2 25   < > 25 27 29 27 26   GLUCOSE 650*   < > 315* 278* 156* 72 95  BUN 62*   < > 40* 40* 44* 33* 33*  CREATININE 1.16*   < > 0.85 0.72 0.87 0.71 0.70  CALCIUM 8.9   < > 9.0 9.0 9.0 8.3* 7.7*  MG  --   --   --   --  2.1 2.0  --   AST 21  --   --   --   --   --   --   ALT 22  --   --   --   --   --   --   ALKPHOS 152*  --   --   --   --   --   --   BILITOT 0.7  --   --   --   --   --   --    < > = values in this interval not displayed.   ------------------------------------------------------------------------------------------------------------------ estimated creatinine  clearance is 72.6 mL/min (by C-G formula based on SCr of 0.7 mg/dL). ------------------------------------------------------------------------------------------------------------------ No results for input(s): HGBA1C in the last 72 hours. ------------------------------------------------------------------------------------------------------------------ No results for input(s): CHOL, HDL, LDLCALC, TRIG, CHOLHDL, LDLDIRECT in the last 72 hours. ------------------------------------------------------------------------------------------------------------------ No results for input(s): TSH, T4TOTAL, T3FREE, THYROIDAB in the last 72 hours.  Invalid input(s): FREET3 ------------------------------------------------------------------------------------------------------------------ No results for input(s): VITAMINB12, FOLATE, FERRITIN, TIBC, IRON, RETICCTPCT in the last 72 hours.  Coagulation profile Recent Labs  Lab 04/20/18 1654 04/21/18 0355 04/21/18 1534  INR >10.0* >10.0* 1.8*    No results for input(s): DDIMER in the last 72 hours.  Cardiac Enzymes Recent Labs  Lab 04/22/18 1441 04/22/18 2145 04/23/18 0255  TROPONINI 26.42* 26.52* 22.80*   ------------------------------------------------------------------------------------------------------------------ Invalid input(s): POCBNP    Assessment & Plan   58 year old female with past medical history of diabetes, hypertension, diabetic neuropathy, COPD with ongoing tobacco abuse, peripheral vascular disease status post left BKA, previous history of nephrolithiasis, history of DVT, chronic back pain who presents to the hospital due to back pain, shortness of breath, weakness   1.  MSSA bacteremia with L1-L2 discitis likely source right great toe ulcer Continue nafcillin, TTE results pending patient states that she does not want TEE COVID test negative I have sent a message to Dr. Jossie Ng  2.  A. fib with RVR heart rate  stable appreciate cardiology input continue amiodarone, continue Lovenox Heart rate stable  3.  COPD without acute exacerbation continue current therapy patient stable from respiratory standpoint  4.  Acute kidney injury resolved with IV hydration  5.  Elevated troponin further management per cardiology  6.  Diabetes type 2 continue Lovenox, and sliding scale insulin  7.  Hyperlipidemia continue Lipitor  8.  Previous history of stroke continue Plavix       Code Status Orders  (From admission, onward)         Start     Ordered   04/20/18 1925  Full code  Continuous     04/20/18 1926        Code Status History    Date Active Date Inactive Code Status Order ID Comments User Context   04/20/2018 1903 04/20/2018 1926 Full Code [id number]  Houston Siren, MD Inpatient    Advance Directive Documentation     Most Recent Value  Type of Advance Directive  Living will  Pre-existing out of facility DNR order (yellow form or pink MOST form)  -  "MOST" Form in Place?  -           Consults cardiology pulmonary critical care  DVT Prophylaxis Lovenox  Lab Results  Component Value Date   PLT 349 04/25/2018     Time Spent in minutes   Greater than 50% of time spent in care coordination and counseling patient regarding the condition and plan of care.   Auburn Bilberry M.D on 04/25/2018 at 12:24 PM  Between 7am to 6pm - Pager - [phone]  After 6pm go to [email or web address] - Social research officer, government  [employer]   Office  [phone]

## 2018-04-25 NOTE — Progress Notes (Signed)
Date of Admission:  04/20/2018    ID: Alexandra Goodman is a 58 y.o. female Active Problems:   Sepsis (HCC)   Pressure injury of skin Staph aureus bacteremia L1-L2 discitis COPD exacerbation Rt toe ulcer    Subjective: Feeling better Awake and alert  Medications:  . amiodarone  150 mg Intravenous Once  . amiodarone  400 mg Oral BID  . aspirin EC  81 mg Oral Daily  . atorvastatin  80 mg Oral Q2000  . clopidogrel  75 mg Oral Daily  . enoxaparin (LOVENOX) injection  65 mg Subcutaneous Q12H  . famotidine  40 mg Oral QPM  . FLUoxetine  20 mg Oral Daily  . insulin aspart  0-15 Units Subcutaneous TID WC  . insulin aspart  0-5 Units Subcutaneous QHS  . insulin glargine  46 Units Subcutaneous Daily  . levothyroxine  88 mcg Oral Q0600  . mouth rinse  15 mL Mouth Rinse BID  . mirabegron ER  25 mg Oral Daily  . mometasone-formoterol  2 puff Inhalation BID  . montelukast  10 mg Oral Daily  . morphine  30 mg Oral Q12H  . oxyCODONE-acetaminophen  1 tablet Oral 5 X Daily  . QUEtiapine  25 mg Oral Q24H  . sodium chloride flush  10-40 mL Intracatheter Q12H  . tiotropium  1 capsule Inhalation Daily  . traZODone  150 mg Oral QHS    Objective: Vital signs in last 24 hours: Temp:  [98.3 F (36.8 C)-99 F (37.2 C)] 99 F (37.2 C) (04/06 0812) Pulse Rate:  [82-98] 88 (04/06 0812) Resp:  [16-20] 18 (04/06 0812) BP: (98-109)/(50-86) 109/55 (04/06 0812) SpO2:  [90 %-93 %] 91 % (04/06 0812) Weight:  [69.6 kg] 69.6 kg (04/06 0432)  PHYSICAL EXAM:  General: Alert, cooperative, no distress, appears stated age.  Head: Normocephalic, without obvious abnormality, atraumatic. Eyes: Conjunctivae clear, anicteric sclerae. Pupils are equal ENT Nares normal. No drainage or sinus tenderness. Lips, mucosa, and tongue normal. No Thrush Neck: Supple, symmetrical, no adenopathy, thyroid: non tender no carotid bruit and no JVD. Back: No CVA tenderness. Lungs: b/l air entry. Heart: s1s2. Abdomen:  Soft, Extremities: rt great toe- infected ulcer Left BKA Skin: No rashes or lesions. Or bruising Lymph: Cervical, supraclavicular normal. Neurologic: Grossly non-focal  Lab Results Recent Labs    04/23/18 0255 04/24/18 0514 04/25/18 0436  WBC 20.7* 17.5* 15.3*  HGB 12.4 10.9* 10.7*  HCT 39.4 33.8* 33.6*  NA 141 136  --   K 3.4* 3.6  --   CL 104 101  --   CO2 27 26  --   BUN 33* 33*  --   CREATININE 0.71 0.70  --    Microbiology: 4/1 MSSA 04/23/18- BC NG   Assessment/Plan: MSSA bacteremia with L1-L2 discitis Likely source for MSSA is the rt great toe ulcer On  nafcillin 2 grams IV q 4  Has  increased bioburden and discitis- Repeat blood culture 4/4 neg so far  Rt toe infection- for podiatrist to see her - may need amputation PAD  Current smoker  H/o Left BKA Rt posterior achilles ulcer - healed- followed at [facility]  COVID 19 negative She is not willing to get TEE  Will get MRI on the lumbar spine to delineate the infection better as she only had a CT renal protocol  COPDexacerbation - improved  DM- management as per primary team Afib RVR intermittent  Discussed the management with the aptient- told her she will need 6 weeks  of IV antibiotic. Says she has had PICC line before for left foot infection

## 2018-04-25 NOTE — Evaluation (Signed)
Physical Therapy Evaluation Patient Details Name: Alexandra Goodman MRN: [id number] DOB: 07/19/1960 Today's Date: 04/25/2018   History of Present Illness  Pt is a 58 y.o. female presenting to hospital with LBP with urinary frequency and admitted with sepsis, PNA, DM, suspected osteomyelitis/discitis (L1-L2), elevated troponin, and AKI.  A-fib with RVR noted during hospitalization (troponin downtrending).  PMH includes L BKA, L1 compression fx, asthma, CHF, COPD, PVD, diabetic neuropathy; uses 3 L chronic home O2; R great toe wound.  Clinical Impression  Prior to hospital admission, pt was modified independent with w/c level transfers and ambulatory household distances with walker and L LE prosthesis; pt reports she does not have her L LE prosthesis in hospital so unable to assess ambulation.  Pt lives with her boyfriend on main level of home with ramp to enter; will either ambulate in home or use power w/c in home.  Currently pt is CGA with lateral scoot recliner to bed (slight uphill) with mild increased effort.  Overall pt demonstrating generalized weakness (pt reporting feeling weaker than normal).  Pt would benefit from skilled PT to address noted impairments and functional limitations (see below for any additional details).  Upon hospital discharge, recommend pt discharge with HHPT.    Follow Up Recommendations Home health PT    Equipment Recommendations  Other (comment)(pt has manual and power w/c at home already)    Recommendations for Other Services       Precautions / Restrictions Precautions Precautions: Fall Restrictions Weight Bearing Restrictions: No Other Position/Activity Restrictions: L BKA (prosthesis not in hospital)      Mobility  Bed Mobility Overal bed mobility: Needs Assistance Bed Mobility: Sit to Supine       Sit to supine: Supervision;HOB elevated   General bed mobility comments: mild increased effort to perform on own; pt requesting HOB elevated prior to  laying down d/t back pain  Transfers Overall transfer level: Needs assistance   Transfers: Lateral/Scoot Transfers          Lateral/Scoot Transfers: Min guard General transfer comment: L armrest lowered; pt performed lateral scoot (2 attempts to fully get to bed) from recliner to bed to L; CGA for safety  Ambulation/Gait             General Gait Details: Deferred (pt does not have prosthesis in hospital)  Stairs            Wheelchair Mobility    Modified Rankin (Stroke Patients Only)       Balance Overall balance assessment: Needs assistance Sitting-balance support: No upper extremity supported;Feet supported Sitting balance-Leahy Scale: Good Sitting balance - Comments: steady sitting reaching within BOS                                     Pertinent Vitals/Pain Pain Assessment: 0-10 Pain Score: 7  Pain Location: LBP Pain Descriptors / Indicators: Aching Pain Intervention(s): Limited activity within patient's tolerance;Monitored during session;Repositioned     Home Living Family/patient expects to be discharged to:: Private residence Living Arrangements: Spouse/significant other(boyfriend) Available Help at Discharge: Friend(s) Type of Home: House Home Access: Ramped entrance     Home Layout: Able to live on main level with bedroom/bathroom Home Equipment: Dan HumphreysWalker - standard;Walker - 4 wheels;Wheelchair - power;Wheelchair - manual      Prior Function Level of Independence: Independent with assistive device(s)         Comments: Ambulatory with L LE  prosthetic and 4ww in home; also uses power w/c in home; uses manual w/c for community.  Chronic 3 L home O2.     Hand Dominance        Extremity/Trunk Assessment   Upper Extremity Assessment Upper Extremity Assessment: Generalized weakness    Lower Extremity Assessment Lower Extremity Assessment: RLE deficits/detail;LLE deficits/detail RLE Deficits / Details: at least 3/5 hip  flexion, knee flexion/extension, and DF/PF AROM RLE Sensation: history of peripheral neuropathy LLE Deficits / Details: at least 3/5 hip flexion and knee flexion/extension; L knee ROM WFL; L BKA LLE Sensation: history of peripheral neuropathy    Cervical / Trunk Assessment Cervical / Trunk Assessment: Normal  Communication   Communication: No difficulties  Cognition Arousal/Alertness: Awake/alert Behavior During Therapy: WFL for tasks assessed/performed Overall Cognitive Status: Within Functional Limits for tasks assessed                                        General Comments General comments (skin integrity, edema, etc.): protective dressing on pt's R great toe.  Nursing cleared pt for participation in physical therapy.  Pt agreeable to PT session.    Exercises     Assessment/Plan    PT Assessment Patient needs continued PT services  PT Problem List Decreased strength;Decreased activity tolerance;Decreased balance;Decreased mobility;Pain;Decreased skin integrity       PT Treatment Interventions DME instruction;Functional mobility training;Therapeutic activities;Therapeutic exercise;Balance training;Patient/family education    PT Goals (Current goals can be found in the Care Plan section)  Acute Rehab PT Goals Patient Stated Goal: to go home PT Goal Formulation: With patient Time For Goal Achievement: 05/09/18 Potential to Achieve Goals: Good    Frequency Min 2X/week   Barriers to discharge        Co-evaluation               AM-PAC PT "6 Clicks" Mobility  Outcome Measure Help needed turning from your back to your side while in a flat bed without using bedrails?: A Little Help needed moving from lying on your back to sitting on the side of a flat bed without using bedrails?: A Little Help needed moving to and from a bed to a chair (including a wheelchair)?: A Little Help needed standing up from a chair using your arms (e.g., wheelchair or  bedside chair)?: A Lot Help needed to walk in hospital room?: A Lot Help needed climbing 3-5 steps with a railing? : Total 6 Click Score: 14    End of Session Equipment Utilized During Treatment: Gait belt;Oxygen Activity Tolerance: Patient tolerated treatment well Patient left: in bed;with call bell/phone within reach;with bed alarm set;Other (comment)(B LE's elevated via pillows with R heel floating) Nurse Communication: Mobility status;Precautions PT Visit Diagnosis: Other abnormalities of gait and mobility (R26.89);Muscle weakness (generalized) (M62.81);Difficulty in walking, not elsewhere classified (R26.2)    Time: 7416-3845 PT Time Calculation (min) (ACUTE ONLY): 26 min   Charges:   PT Evaluation $PT Eval Low Complexity: 1 Low         Zan Orlick, PT 04/25/18, 4:45 PM [phone]

## 2018-04-25 NOTE — Consult Note (Signed)
WOC Nurse wound consult note Reason for Consult: right great toe wound Patient with BKA on the LLE Cool right foot with weak distal pulses Wound type: PAD with full thickness ulceration ulceration of the right great toe Pressure Injury POA: NA Measurement: 1cm x 1cm x 0.2cm Wound IRW:ERXVQM/GQQP, soft tissue Drainage (amount, consistency, odor) minimal, no odor Periwound: intact no evidence of acute infection No xrays or MRI noted  Dressing procedure/placement/frequency: Enzymatic debridement ointment, the wound is soft necrotic tissue with drainage. Does not appear to be acutely infected. Ok for patient to take remainder of the debridement ointment home for use. She is followed by [facility] and she needs to follow up with them after DC.   Discussed POC with patient and bedside nurse.  Re consult if needed, will not follow at this time. Thanks  Omar Orrego [employer], RN,CWOCN, CNS, CWON-AP [phone])

## 2018-04-25 NOTE — Consult Note (Signed)
Reason for Consult: Ulceration right great toe Referring Physician: [name] is an 58 y.o. female.  HPI: This is a 58 year old female with a chronic history of diabetes with associated neuropathy and previous amputation of the left lower extremity.  States she has had a chronic ulceration on her right great toe.  Recently admitted for symptoms of urinary tract infection as well as respiratory symptoms and to rule out COVID-19.  States she has a hard time taking care of her right great toe because she cannot really see that well.  Past Medical History:  Diagnosis Date  . Asthma   . CHF (congestive heart failure) (HCC)   . Chronic pain   . COPD (chronic obstructive pulmonary disease) (HCC)   . Diabetes mellitus (HCC)   . Peripheral vascular disease (HCC)   . Stroke (cerebrum) Alexandra Goodman)     Past Surgical History:  Procedure Laterality Date  . below the knee LLE amputation Left     Family History  Problem Relation Age of Onset  . Heart disease Mother   . Heart disease Father     Social History:  reports that she has been smoking cigarettes. She has a 90.00 pack-year smoking history. She has never used smokeless tobacco. She reports previous alcohol use. She reports that she does not use drugs.  Allergies:  Allergies  Allergen Reactions  . Skin Protectants, Misc. Rash  . Flagyl [Metronidazole]   . Gabapentin   . Levetiracetam   . Tape Other (See Comments)    blisters  . Ace Inhibitors Rash  . Ertapenem Rash  . Sertraline Hcl Rash    Medications:  Scheduled: . amiodarone  150 mg Intravenous Once  . amiodarone  400 mg Oral BID  . aspirin EC  81 mg Oral Daily  . atorvastatin  80 mg Oral Q2000  . clopidogrel  75 mg Oral Daily  . collagenase   Topical Daily  . enoxaparin (LOVENOX) injection  65 mg Subcutaneous Q12H  . famotidine  40 mg Oral QPM  . FLUoxetine  20 mg Oral Daily  . insulin aspart  0-15 Units Subcutaneous TID WC  . insulin aspart  0-5 Units  Subcutaneous QHS  . insulin glargine  46 Units Subcutaneous Daily  . levothyroxine  88 mcg Oral Q0600  . mouth rinse  15 mL Mouth Rinse BID  . mirabegron ER  25 mg Oral Daily  . mometasone-formoterol  2 puff Inhalation BID  . montelukast  10 mg Oral Daily  . morphine  30 mg Oral Q12H  . oxyCODONE-acetaminophen  1 tablet Oral 5 X Daily  . QUEtiapine  25 mg Oral Q24H  . senna-docusate  1 tablet Oral BID  . sodium chloride flush  10-40 mL Intracatheter Q12H  . tiotropium  1 capsule Inhalation Daily  . traZODone  150 mg Oral QHS    Results for orders placed or performed during the hospital encounter of 04/20/18 (from the past 48 hour(s))  Glucose, capillary     Status: Abnormal   Collection Time: 04/23/18  7:37 PM  Result Value Ref Range   Glucose-Capillary 358 (H) 70 - 99 mg/dL  Glucose, capillary     Status: Abnormal   Collection Time: 04/23/18 11:27 PM  Result Value Ref Range   Glucose-Capillary 277 (H) 70 - 99 mg/dL  Glucose, capillary     Status: Abnormal   Collection Time: 04/24/18  4:35 AM  Result Value Ref Range   Glucose-Capillary 100 (H) 70 -  99 mg/dL   Comment 1 Notify RN    Comment 2 Document in Chart   CBC with Differential/Platelet     Status: Abnormal   Collection Time: 04/24/18  5:14 AM  Result Value Ref Range   WBC 17.5 (H) 4.0 - 10.5 K/uL   RBC 4.40 3.87 - 5.11 MIL/uL   Hemoglobin 10.9 (L) 12.0 - 15.0 g/dL   HCT 88.6 (L) 77.3 - 73.6 %   MCV 76.8 (L) 80.0 - 100.0 fL   MCH 24.8 (L) 26.0 - 34.0 pg   MCHC 32.2 30.0 - 36.0 g/dL   RDW 68.1 (H) 59.4 - 70.7 %   Platelets 333 150 - 400 K/uL   nRBC 0.0 0.0 - 0.2 %   Neutrophils Relative % 87 %   Neutro Abs 15.2 (H) 1.7 - 7.7 K/uL   Lymphocytes Relative 7 %   Lymphs Abs 1.3 0.7 - 4.0 K/uL   Monocytes Relative 4 %   Monocytes Absolute 0.7 0.1 - 1.0 K/uL   Eosinophils Relative 1 %   Eosinophils Absolute 0.2 0.0 - 0.5 K/uL   Basophils Relative 0 %   Basophils Absolute 0.0 0.0 - 0.1 K/uL   Immature Granulocytes 1  %   Abs Immature Granulocytes 0.15 (H) 0.00 - 0.07 K/uL    Comment: Performed at [facility], [address] [place], [place] [address]  Basic metabolic panel     Status: Abnormal   Collection Time: 04/24/18  5:14 AM  Result Value Ref Range   Sodium 136 135 - 145 mmol/L   Potassium 3.6 3.5 - 5.1 mmol/L   Chloride 101 98 - 111 mmol/L   CO2 26 22 - 32 mmol/L   Glucose, Bld 95 70 - 99 mg/dL   BUN 33 (H) 6 - 20 mg/dL   Creatinine, Ser 3.43 0.44 - 1.00 mg/dL   Calcium 7.7 (L) 8.9 - 10.3 mg/dL   GFR calc non Af Amer >60 >60 mL/min   GFR calc Af Amer >60 >60 mL/min   Anion gap 9 5 - 15    Comment: Performed at [facility], [address] [place], [place] [address]  Glucose, capillary     Status: None   Collection Time: 04/24/18  7:56 AM  Result Value Ref Range   Glucose-Capillary 94 70 - 99 mg/dL  Glucose, capillary     Status: Abnormal   Collection Time: 04/24/18 11:36 AM  Result Value Ref Range   Glucose-Capillary 135 (H) 70 - 99 mg/dL  Glucose, capillary     Status: Abnormal   Collection Time: 04/24/18  4:45 PM  Result Value Ref Range   Glucose-Capillary 144 (H) 70 - 99 mg/dL  Glucose, capillary     Status: Abnormal   Collection Time: 04/24/18  8:48 PM  Result Value Ref Range   Glucose-Capillary 304 (H) 70 - 99 mg/dL  CBC     Status: Abnormal   Collection Time: 04/25/18  4:36 AM  Result Value Ref Range   WBC 15.3 (H) 4.0 - 10.5 K/uL   RBC 4.36 3.87 - 5.11 MIL/uL   Hemoglobin 10.7 (L) 12.0 - 15.0 g/dL   HCT 97.8 (L) 47.8 - 41.2 %   MCV 77.1 (L) 80.0 - 100.0 fL   MCH 24.5 (L) 26.0 - 34.0 pg   MCHC 31.8 30.0 - 36.0 g/dL   RDW 82.0 (H) 81.3 - 88.7 %   Platelets 349 150 - 400 K/uL   nRBC 0.0 0.0 - 0.2 %  Comment: Performed at [facility], [address] [place], [place] [address]  Glucose, capillary     Status: Abnormal   Collection Time: 04/25/18  7:43 AM  Result Value Ref Range   Glucose-Capillary 118 (H) 70 - 99 mg/dL  Glucose,  capillary     Status: Abnormal   Collection Time: 04/25/18 11:47 AM  Result Value Ref Range   Glucose-Capillary 160 (H) 70 - 99 mg/dL    No results found.  Review of Systems  Constitutional: Negative for chills and fever.  HENT: Negative for congestion and hearing loss.   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   Cardiovascular: Negative for chest pain and palpitations.  Gastrointestinal: Negative for nausea and vomiting.  Genitourinary: Negative for dysuria and urgency.  Musculoskeletal:       Relates previous amputation of her left lower extremity.  Some back pain  Skin:       Patient relates a chronic sore on her right great toe.  Neurological:       Relates significant neuropathy related to her diabetes.  Endo/Heme/Allergies: Does not bruise/bleed easily.  Psychiatric/Behavioral: Negative for depression. The patient is not nervous/anxious.    Blood pressure (!) 105/57, pulse 88, temperature 98.4 F (36.9 C), temperature source Oral, resp. rate 18, height  (1.6 m), weight 69.6 kg, SpO2 96 %. Physical Exam  Cardiovascular:  DP and PT pulses are fully palpable on the right foot.  Toes are cool to the touch  Musculoskeletal:     Comments: Previous below-knee amputation left lower extremity.  Adequate range of motion in the pedal joints on the right with some mild digital contractures  Neurological:  Loss of protective threshold with a monofilament wire in the right lower extremity.  Skin:  The skin is cool dry and somewhat atrophic with diminished hair growth.  A full-thickness ulceration is noted on the distal plantar aspect of the right hallux with some central fibrotic tissue.  Measures approximately 10 mm x 6 to 7 mm both pre-and post debridement with a depth of approximately 3 mm more granular appearing base following debridement with some active bleeding.  No expressible purulence.  All of  the toenails on the right foot are also thick, dystrophic, discolored, brittle with some subungual debris.  Assessment/Plan: Assessment: 1.  Full-thickness ulceration right hallux. 2.  Diabetes with associated neuropathy. 3.  Previous left lower extremity amputation. 4.  Onychomycosis   Plan: Excisional sharp debridement of devitalized tissue from the right hallux ulceration full-thickness down to the deeper subcutaneous tissue using a pair of tissue nippers.  Sterile saline wet-to-dry dressing applied to the right great toe.  We will obtain an x-ray for evaluation of any bone involvement.  At this point there does not appear to be any significant infection in the right great toe and would suspect that unless changes are present on x-ray she is not an imminent need for amputation.  I will follow-up with her tomorrow pending her x-ray results.  Alexandra Goodman 04/25/2018, 5:01 PM

## 2018-04-25 NOTE — TOC Progression Note (Addendum)
Transition of Care [facility]) - Progression Note    Patient Details  Name: Alexandra Goodman MRN: [id number] Date of Birth: [birth year]  Transition of Care [facility]) CM/SW Contact  Sherren Kerns, RN Phone Number: 04/25/2018, 11:58 AM  Clinical Narrative:   [facility] consult for home antibiotics.   Referral to Jeri Modena; she will be by the either this afternoon or tomorrow morning for teaching.  Spoke with patient; she has been on home antibiotics in the past and is familiar with the process.  PICC line in place.  She does not have a preference for home health nursing.  Referral to [facility] for RN for IV antibiotics and wound care-will need wound care orders for DC. Goes to the wound clinic at [facility] every few months.  Daughter will transport home at discharge.        Expected Discharge Plan: Skilled Nursing Facility Barriers to Discharge: Continued Medical Work up  Expected Discharge Plan and Services Expected Discharge Plan: Skilled Nursing Facility   Discharge Planning Services: CM Consult   Living arrangements for the past 2 months: Mobile Home                           Social Determinants of Health (SDOH) Interventions    Readmission Risk Interventions No flowsheet data found.

## 2018-04-25 NOTE — Progress Notes (Signed)
Patient Name: Alexandra Goodman Date of Encounter: 04/25/2018  Hospital Problem List     Active Problems:   Sepsis (HCC)   Pressure injury of skin    Patient Profile     Patient with history of coronary artery disease, diabetes, substance abuse admitted with sepsis symptoms.  Troponin was elevated.  Patient had no ischemic EKG changes or chest pain.  Elevated troponin appeared to be demand ischemia.  Patient has bacteremia as well as right toe ulcer.  She also has evidence of L1-L2 discitis.  Currently on nafcillin.  CO VID test negative.  Also has intermittent atrial fibrillation.  Currently well controlled.  Scheduled for TEE today with anesthesia assistance due to narcotic abuse.  Patient defers.  Subjective   No complaints.  Wants to go home today.  States her doctors are in [place] and she wishes to be followed up there.  Inpatient Medications    . amiodarone  150 mg Intravenous Once  . amiodarone  400 mg Oral BID  . aspirin EC  81 mg Oral Daily  . atorvastatin  80 mg Oral Q2000  . clopidogrel  75 mg Oral Daily  . enoxaparin (LOVENOX) injection  65 mg Subcutaneous Q12H  . famotidine  40 mg Oral QPM  . FLUoxetine  20 mg Oral Daily  . insulin aspart  0-15 Units Subcutaneous TID WC  . insulin aspart  0-5 Units Subcutaneous QHS  . insulin glargine  46 Units Subcutaneous Daily  . levothyroxine  88 mcg Oral Q0600  . mouth rinse  15 mL Mouth Rinse BID  . mirabegron ER  25 mg Oral Daily  . mometasone-formoterol  2 puff Inhalation BID  . montelukast  10 mg Oral Daily  . morphine  30 mg Oral Q12H  . oxyCODONE-acetaminophen  1 tablet Oral 5 X Daily  . QUEtiapine  25 mg Oral Q24H  . sodium chloride flush  10-40 mL Intracatheter Q12H  . tiotropium  1 capsule Inhalation Daily  . traZODone  150 mg Oral QHS    Vital Signs    Vitals:   04/24/18 1102 04/24/18 1547 04/24/18 1941 04/25/18 0432  BP: (!) 97/54 98/84 100/86 (!) 108/50  Pulse: 88 98 82 92  Resp:  Temp:   98.3 F (36.8 C) 98.7 F (37.1 C) 98.9 F (37.2 C)  TempSrc:   Oral Oral  SpO2: 92% 90% 93% 91%  Weight:    69.6 kg  Height:        Intake/Output Summary (Last 24 hours) at 04/25/2018 0759 Last data filed at 04/25/2018 0430 Gross per 24 hour  Intake 715.38 ml  Output 1300 ml  Net -584.62 ml   Filed Weights   04/20/18 1900 04/24/18 0057 04/25/18 0432  Weight: 65.2 kg 68 kg 69.6 kg    Physical Exam    GEN: Well nourished, well developed, in no acute distress.  HEENT: normal.  Neck: Supple, no JVD, carotid bruits, or masses. Cardiac: RRR, no murmurs, rubs, or gallops. No clubbing, cyanosis, edema.  Radials/DP/PT 2+ and equal bilaterally.  Respiratory:  Respirations regular and unlabored, clear to auscultation bilaterally. GI: Soft, nontender, nondistended, BS + x 4. MS: no deformity or atrophy. Skin: warm and dry, no rash. Neuro:  Strength and sensation are intact. Psych: Normal affect.  Labs    CBC Recent Labs    04/24/18 0514 04/25/18 0436  WBC 17.5* 15.3*  NEUTROABS 15.2*  --   HGB 10.9* 10.7*  HCT  33.8* 33.6*  MCV 76.8* 77.1*  PLT 333 349   Basic Metabolic Panel Recent Labs    40/98/1102/05/09 0255 04/24/18 0514  NA 141 136  K 3.4* 3.6  CL 104 101  CO2 27 26  GLUCOSE 72 95  BUN 33* 33*  CREATININE 0.71 0.70  CALCIUM 8.3* 7.7*  MG 2.0  --    Liver Function Tests No results for input(s): AST, ALT, ALKPHOS, BILITOT, PROT, ALBUMIN in the last 72 hours. No results for input(s): LIPASE, AMYLASE in the last 72 hours. Cardiac Enzymes Recent Labs    04/22/18 1441 04/22/18 2145 04/23/18 0255  TROPONINI 26.42* 26.52* 22.80*   BNP No results for input(s): BNP in the last 72 hours. D-Dimer No results for input(s): DDIMER in the last 72 hours. Hemoglobin A1C No results for input(s): HGBA1C in the last 72 hours. Fasting Lipid Panel No results for input(s): CHOL, HDL, LDLCALC, TRIG, CHOLHDL, LDLDIRECT in the last 72 hours. Thyroid Function Tests No results for  input(s): TSH, T4TOTAL, T3FREE, THYROIDAB in the last 72 hours.  Invalid input(s): FREET3  Telemetry    Currently in sinus rhythm  ECG       Radiology    Dg Chest Port 1 View  Result Date: 04/22/2018 CLINICAL DATA:  Acute respiratory failure.  History of CHF and COPD. EXAM: PORTABLE CHEST 1 VIEW COMPARISON:  Chest radiograph April 20, 2018 FINDINGS: Cardiac silhouette is mildly enlarged. Coronary artery stent. Calcified aortic arch. Worsening retrocardiac airspace opacity with small LEFT pleural effusion. Persistent small LEFT upper lobe nodular density. New bandlike density RIGHT lung base. Mild chronic interstitial changes. No pneumothorax. Soft tissue planes and included osseous structures are non suspicious. Multiple EKG lines overlie the patient and may obscure subtle underlying pathology. IMPRESSION: 1. Worsening retrocardiac consolidation with small pleural effusion. New RIGHT lung base atelectasis, less likely pneumonia. Followup PA and lateral chest X-ray is recommended in 3-4 weeks following trial of antibiotic therapy to ensure resolution and exclude underlying malignancy. 2. Stable nodular density LEFT upper lobe. 3. Mild cardiomegaly. Electronically Signed   By: Awilda Metroourtnay  Bloomer M.D.   On: 04/22/2018 03:58   Dg Chest Port 1 View  Result Date: 04/20/2018 CLINICAL DATA:  Fever, multiple complaint EXAM: PORTABLE CHEST 1 VIEW COMPARISON:  01/14/2013 FINDINGS: Mild bilateral interstitial thickening. Hazy left lower lobe airspace disease. Small area of airspace disease in the left upper lobe. Possible small left pleural effusion. No right pleural effusion. No pneumothorax. Stable cardiomediastinal silhouette. Thoracic aortic atherosclerosis. No acute osseous abnormality. IMPRESSION: 1. Hazy left lower lobe airspace disease. Small area of airspace disease in the left upper lobe. Findings are concerning for an infectious etiology. Electronically Signed   By: Elige KoHetal  Patel   On: 04/20/2018 15:08    Ct Renal Stone Study  Result Date: 04/20/2018 CLINICAL DATA:  58 year old female with abdominal pain EXAM: CT ABDOMEN AND PELVIS WITHOUT CONTRAST TECHNIQUE: Multidetector CT imaging of the abdomen and pelvis was performed following the standard protocol without IV contrast. COMPARISON:  None. FINDINGS: Lower chest: Respiratory motion limits evaluation. Atelectasis at the left lung base. Hepatobiliary: Cranial caudal span of the right liver measures greater than 18 cm. No focal lesion or nodular contour. Cholecystectomy. No intrahepatic or extrahepatic biliary ductal dilatation. Pancreas: Unremarkable pancreas Spleen: Unremarkable spleen Adrenals/Urinary Tract: Unremarkable adrenal glands. Right kidney demonstrates regions of cortical thinning. No hydronephrosis. Vascular calcifications in the hilum of the right kidney. Unremarkable course of the right ureter. Left kidney demonstrates no hydronephrosis. Likely nonobstructive  stone in the inferior collecting system, versus vascular calcifications. Unremarkable course the left ureter. Urinary bladder partially distended. Stomach/Bowel: Unremarkable stomach. Unremarkable small bowel. Moderate to large formed stool burden. No focal inflammatory changes. Normal appendix. Vascular/Lymphatic: Vascular calcifications of the aorta and bilateral iliac arteries. Venous collaterals on the anterior pelvic wall favored to reflect chronic left iliac main stenosis/occlusion. Reproductive: Unremarkable appearance of uterus. Other: Hazy edema/infiltration of the musculature associated with the left eye fragment a cruise. There are small gas locules present. This is in continuity with the anterior aspect of the L1-L2 disc space, which appears relatively widened compared to adjacent disc spaces. Musculoskeletal: Vacuum disc phenomenon at T12-L1. Compression fracture of L1. Irregularity of the inferior endplate with questionable disc space widening of L1-L2. Ill-defined soft tissue  extends from the anterior aspect of the L1-L2 disc space into the left eye frag medic cruise. Degenerative changes of the lower lumbar spine. Vacuum disc phenomenon at L4-L5. IMPRESSION: Soft tissue changes of the left diaphragmatic cruz with small gas locules concerning for infection with gas-forming organism. This is in continuity with the anterior aspect of the L1-L2 disc space, and the source may be secondary to discitis/osteomyelitis at this site. Evaluation with MRI with contrast may be useful. Compression fracture of L1 of indeterminate age. These results were discussed by telephone at the time of interpretation on 04/20/2018 at 4:10 pm with Dr. Sharma Covert. Aortic Atherosclerosis (ICD10-I70.0). 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 Correlation with a prior history of DVT may be useful. Hepatomegaly. Electronically Signed   By: Gilmer Mor D.O.   On: 04/20/2018 16:14   [place] Ekg Site Rite  Result Date: 04/21/2018 If Site Rite image not attached, placement could not be confirmed due to current cardiac rhythm.   Assessment & Plan    58 year old female with history of coronary disease, substance abuse, paroxysmal A. fib, diabetes admitted with sepsis.  Had elevated serum troponin but no chest pain, no evidence of EKG changes.  Currently is hemodynamically stable.  Bacteremia-has MSSA bacteremia.  Has a right great toe ulcer as well as L1-L2 discitis.  Transthoracic echo is pending.  Patient has deferred approval for transesophageal echo.  If transthoracic echo is positive for vegetations would treat accordingly.  If negative will rediscuss with patient however she is told me today on cervical periods of questioning that she prefers no further testing done here.  She states her heart doctors are in [facility] and wants any further follow-up done there.  Would continue with empiric antibiotics  Positive troponin-no chest pain or ischemic changes on electrocardiogram.  Has bacteremia.  Not ideal  candidate for PCI or CABG at this point.  Given stability would treat as demand ischemia with empiric antianginals and antiplatelet therapy.  Patient was on warfarin on admission.  Would treat with aspirin and warfarin.  Further work-up when bacteremia is controlled invasively could be considered  Atrial fibrillation-paroxysmal A. fib-currently controlled.  We will continue with p.o. amiodarone.  Will decrease to 200 mg twice daily would remain on this dose at least for 3 weeks after discharge.  Signed, Darlin Priestly Seniyah Esker MD 04/25/2018, 7:59 AM  Pager: [phone]

## 2018-04-26 ENCOUNTER — Inpatient Hospital Stay: Payer: Medicare Other

## 2018-04-26 LAB — C-REACTIVE PROTEIN: CRP: 17.2 mg/dL — ABNORMAL HIGH (ref <1.0)

## 2018-04-26 LAB — GLUCOSE, CAPILLARY
Glucose-Capillary: 102 mg/dL — ABNORMAL HIGH (ref 70–99)
Glucose-Capillary: 45 mg/dL — ABNORMAL LOW (ref 70–99)
Glucose-Capillary: 92 mg/dL (ref 70–99)
Glucose-Capillary: 160 mg/dL — ABNORMAL HIGH (ref 70–99)
Glucose-Capillary: 184 mg/dL — ABNORMAL HIGH (ref 70–99)

## 2018-04-26 LAB — CBC
HCT: 36.8 % (ref 36.0–46.0)
Hemoglobin: 11.5 g/dL — ABNORMAL LOW (ref 12.0–15.0)
MCH: 24.2 pg — ABNORMAL LOW (ref 26.0–34.0)
MCHC: 31.3 g/dL (ref 30.0–36.0)
MCV: 77.5 fL — ABNORMAL LOW (ref 80.0–100.0)
Platelets: 416 10*3/uL — ABNORMAL HIGH (ref 150–400)
RBC: 4.75 MIL/uL (ref 3.87–5.11)
RDW: 20.3 % — ABNORMAL HIGH (ref 11.5–15.5)
WBC: 19.3 10*3/uL — ABNORMAL HIGH (ref 4.0–10.5)
nRBC: 0 % (ref 0.0–0.2)

## 2018-04-26 LAB — BASIC METABOLIC PANEL
Anion gap: 11 (ref 5–15)
BUN: 37 mg/dL — ABNORMAL HIGH (ref 6–20)
CO2: 27 mmol/L (ref 22–32)
Calcium: 8.6 mg/dL — ABNORMAL LOW (ref 8.9–10.3)
Chloride: 99 mmol/L (ref 98–111)
Creatinine, Ser: 1.14 mg/dL — ABNORMAL HIGH (ref 0.44–1.00)
GFR calc Af Amer: >60 mL/min (ref >60)
GFR calc non Af Amer: 53 mL/min — ABNORMAL LOW (ref >60)
Glucose, Bld: 59 mg/dL — ABNORMAL LOW (ref 70–99)
Potassium: 4.7 mmol/L (ref 3.5–5.1)
Sodium: 137 mmol/L (ref 135–145)

## 2018-04-26 LAB — SEDIMENTATION RATE: Sed Rate: 18 mm/hr (ref 0–30)

## 2018-04-26 MED ORDER — POLYETHYLENE GLYCOL 3350 17 G PO PACK
17.0000 g | PACK | Freq: Every day | ORAL | Status: DC
  Administered 2018-04-26 – 2018-04-27 (×2): 17 g via ORAL
  Filled 2018-04-26: qty 1

## 2018-04-26 MED ORDER — CEFAZOLIN SODIUM-DEXTROSE 2-4 GM/100ML-% IV SOLN
2.0000 g | Freq: Three times a day (TID) | INTRAVENOUS | Status: DC
  Administered 2018-04-26 – 2018-04-27 (×4): 2 g via INTRAVENOUS
  Filled 2018-04-26 (×9): qty 100

## 2018-04-26 NOTE — Progress Notes (Signed)
Patient Name: Alexandra Goodman Date of Encounter: 04/26/2018  Hospital Problem List     Active Problems:   Sepsis (HCC)   Pressure injury of skin    Patient Profile     58 year old female with diabetes, history of A. fib with rapid ventricular response, admitted with sepsis acute kidney injury and elevated troponin.  She has had transient atrial fibrillation with rapid ventricular response.  Currently is in sinus rhythm on amiodarone.  She has a history of MSSA bacteremia.  Transthoracic echo was read as showing no evidence of valvular disease.  Patient has currently deferred transesophageal echo.  Is being treated with antibiotics.  Denies chest pain or shortness of breath.  Complains of generalized weakness.  She is status post left lower extremity BKA.  Hemodynamically stable.  evidence Subjective   No complaints of chest pain.  Desires to go home.  Inpatient Medications    . amiodarone  150 mg Intravenous Once  . amiodarone  400 mg Oral BID  . aspirin EC  81 mg Oral Daily  . atorvastatin  80 mg Oral Q2000  . clopidogrel  75 mg Oral Daily  . collagenase   Topical Daily  . enoxaparin (LOVENOX) injection  65 mg Subcutaneous Q12H  . famotidine  40 mg Oral QPM  . FLUoxetine  20 mg Oral Daily  . insulin aspart  0-15 Units Subcutaneous TID WC  . insulin aspart  0-5 Units Subcutaneous QHS  . insulin glargine  46 Units Subcutaneous Daily  . levothyroxine  88 mcg Oral Q0600  . mouth rinse  15 mL Mouth Rinse BID  . mirabegron ER  25 mg Oral Daily  . mometasone-formoterol  2 puff Inhalation BID  . montelukast  10 mg Oral Daily  . morphine  30 mg Oral Q12H  . oxyCODONE-acetaminophen  1 tablet Oral 5 X Daily  . QUEtiapine  25 mg Oral Q24H  . senna-docusate  1 tablet Oral BID  . sodium chloride flush  10-40 mL Intracatheter Q12H  . tiotropium  1 capsule Inhalation Daily  . traZODone  150 mg Oral QHS    Vital Signs    Vitals:   04/25/18 1540 04/25/18 2009 04/26/18 0408 04/26/18  0731  BP: (!) 105/57 (!) 114/53 (!) 110/59 (!) 147/59  Pulse: 88 81 94 95  Resp:  16    Temp: 98.4 F (36.9 C) 99 F (37.2 C) 99.9 F (37.7 C) 98.3 F (36.8 C)  TempSrc: Oral Oral Oral Oral  SpO2: 96% 94% 95% 91%  Weight:   66 kg   Height:        Intake/Output Summary (Last 24 hours) at 04/26/2018 0752 Last data filed at 04/26/2018 0408 Gross per 24 hour  Intake 240 ml  Output 400 ml  Net -160 ml   Filed Weights   04/24/18 0057 04/25/18 0432 04/26/18 0408  Weight: 68 kg 69.6 kg 66 kg    Physical Exam    GEN: Well nourished, well developed, in no acute distress.  HEENT: normal.  Neck: Supple, no JVD, carotid bruits, or masses. Cardiac: RRR, no murmurs, rubs, or gallops. No clubbing, cyanosis, edema.  Status post left BKA. Respiratory:  Respirations regular and unlabored, clear to auscultation bilaterally. GI: Soft, nontender, nondistended, BS + x 4. MS: no deformity or atrophy.  Neuro:  Strength and sensation are intact. Psych: Normal affect.  Labs    CBC Recent Labs    04/24/18 0514 04/25/18 0436 04/26/18 0247  WBC 17.5* 15.3*  19.3*  NEUTROABS 15.2*  --   --   HGB 10.9* 10.7* 11.5*  HCT 33.8* 33.6* 36.8  MCV 76.8* 77.1* 77.5*  PLT 333 349 416*   Basic Metabolic Panel Recent Labs    16/10/96 0514 04/26/18 0247  NA 136 137  K 3.6 4.7  CL 101 99  CO2 26 27  GLUCOSE 95 59*  BUN 33* 37*  CREATININE 0.70 1.14*  CALCIUM 7.7* 8.6*   Liver Function Tests No results for input(s): AST, ALT, ALKPHOS, BILITOT, PROT, ALBUMIN in the last 72 hours. No results for input(s): LIPASE, AMYLASE in the last 72 hours. Cardiac Enzymes No results for input(s): CKTOTAL, CKMB, CKMBINDEX, TROPONINI in the last 72 hours. BNP No results for input(s): BNP in the last 72 hours. D-Dimer No results for input(s): DDIMER in the last 72 hours. Hemoglobin A1C No results for input(s): HGBA1C in the last 72 hours. Fasting Lipid Panel No results for input(s): CHOL, HDL, LDLCALC,  TRIG, CHOLHDL, LDLDIRECT in the last 72 hours. Thyroid Function Tests No results for input(s): TSH, T4TOTAL, T3FREE, THYROIDAB in the last 72 hours.  Invalid input(s): FREET3  Telemetry    Normal sinus rhythm  ECG      Radiology    Mr Lumbar Spine W Wo Contrast  Result Date: 04/25/2018 CLINICAL DATA:  Initial evaluation for acute infection, osteomyelitis discitis. EXAM: MRI LUMBAR SPINE WITHOUT AND WITH CONTRAST TECHNIQUE: Multiplanar and multiecho pulse sequences of the lumbar spine were obtained without and with intravenous contrast. CONTRAST:  7 cc of Gadavist. COMPARISON:  Prior CT from 04/20/2018. FINDINGS: Segmentation: Standard. Lowest well-formed disc labeled the L5-S1 level. Alignment: 3 mm anterolisthesis of L4 on L5. Alignment otherwise normal with preservation of the normal lumbar lordosis. No listhesis or subluxation. Vertebrae: Severe compression deformity involving the L1 vertebral body, subacute in appearance with mild residual edema an enhancement. Associated advanced near complete height loss centrally with trace 4 mm bony retropulsion at the posterior/inferior aspect of the L1 vertebral body. Vertebral body heights otherwise maintained with no other evidence for acute or chronic fracture. Abnormal marrow edema and enhancement seen involving the L2 vertebral body, most notable on the left, compatible with acute osteomyelitis. Extension into the left greater than right posterior elements of L2. Additional changes consistent with osteomyelitis within the left pedicle of L3. Probable associated septic arthritis involving the intervening L2-3 facet. Edema and enhancement within the L1 vertebral body could be related to fracture and/or infection. Probable subtle changes of mild osteomyelitis at the inferior endplate of T12 as well. Fluid density within the T12-L1 and L1-2 interspace is suspicious for associated discitis. Possible subtle changes at L2-3 as well. Associated epidural  phlegmon/abscess involving the central and left ventral epidural space extending from L2-3 through L4-5 (series 11, image 9). Additional epidural phlegmon/abscess seen at the right dorsal epidural space at the level of L2-3 (series 14, image 9). Mild epidural phlegmon extends cephalad to the level of approximately T12 (series 11, image 8). Conus medullaris and cauda equina: Conus extends to the L1 level. Thin leptomeningeal enhancement about the conus medullaris, likely infectious in nature. Paraspinal and other soft tissues: Extensive paraspinous edema within the bilateral psoas and left posterior paraspinous musculature. Superimposed multifocal soft tissue abscesses are seen. Largest of these within the left posterior paraspinous musculature measures approximately 2.6 x 3.0 cm (series 15, image 18). Largest abscess involving the left psoas muscle measures approximately 12 mm and is seen at the level of L2-3 (series 15, image 18).  Largest involving the right psoas muscle measures 14 x 12 mm and is also seen at the level of L2-3 (series 15, image 18). Involvement of the left crus of diaphragm with associated 13 mm abscess noted (series 15, image 4), similar to prior CT. Multifocal cortical thinning and scarring noted within the right kidney. Scattered superimposed renal cysts noted bilaterally. Visualized visceral structures otherwise unremarkable. Disc levels: T12-L1: Mild disc bulge. Epidural phlegmon/abscess at the posterior epidural space. No significant stenosis. L1-2: L1 compression deformity with up to 4 mm bony retropulsion. Associated diffuse disc bulge with intervertebral disc space narrowing. Mild bilateral facet hypertrophy. Superimposed epidural phlegmon/abscess, most notable at the right posterolateral epidural space. Resultant mild spinal stenosis. Mild bilateral L1 foraminal narrowing. L2-3: Minimal disc bulge. Mild to moderate facet hypertrophy. Epidural phlegmon/abscess, extending into the left  L2-3 neural foramen. Central canal remains patent. Mild left foraminal stenosis. L3-4: Diffuse disc bulge with intervertebral disc space narrowing. Moderate facet hypertrophy. Epidural phlegmon/abscess within the central/left ventral epidural space. Resultant moderate canal with severe left lateral recess stenosis. Mild left foraminal narrowing. L4-5: Anterolisthesis. Diffuse disc bulge with disc desiccation. Epidural phlegmon/abscess involves the ventral epidural space, more notable on the left. Moderate facet arthrosis. Resultant moderate canal with left greater than right lateral recess stenosis. Foramina remain patent. L5-S1: Mild diffuse disc bulge. Mild facet hypertrophy. Resultant mild left lateral recess narrowing. Central canal remains patent. No significant foraminal encroachment. IMPRESSION: 1. Findings consistent with acute osteomyelitis involving the L2 and L3 vertebral bodies with probable septic arthritis involving the left L2-3 facet. Mild marrow edema involving the T12 and L1 vertebral bodies concerning for early/mild involvement as well. Suspected early changes of discitis in the intervening T12-L1 through L2-3 interspaces. 2. Associated epidural phlegmon/abscess extending from T12-L1 through L4-5 as above. Changes most pronounced at the L3-4 level were there is resultant moderate canal with severe left lateral recess stenosis. 3. Associated paraspinous phlegmon with soft tissue abscesses involving the bilateral psoas musculature, left crus of diaphragm, and left posterior paraspinous musculature as above. 4. L1 compression deformity with advanced height loss and up to 4 mm bony retropulsion, subacute in appearance. Electronically Signed   By: Rise Mu M.D.   On: 04/25/2018 18:29   Dg Chest Port 1 View  Result Date: 04/22/2018 CLINICAL DATA:  Acute respiratory failure.  History of CHF and COPD. EXAM: PORTABLE CHEST 1 VIEW COMPARISON:  Chest radiograph April 20, 2018 FINDINGS: Cardiac  silhouette is mildly enlarged. Coronary artery stent. Calcified aortic arch. Worsening retrocardiac airspace opacity with small LEFT pleural effusion. Persistent small LEFT upper lobe nodular density. New bandlike density RIGHT lung base. Mild chronic interstitial changes. No pneumothorax. Soft tissue planes and included osseous structures are non suspicious. Multiple EKG lines overlie the patient and may obscure subtle underlying pathology. IMPRESSION: 1. Worsening retrocardiac consolidation with small pleural effusion. New RIGHT lung base atelectasis, less likely pneumonia. Followup PA and lateral chest X-ray is recommended in 3-4 weeks following trial of antibiotic therapy to ensure resolution and exclude underlying malignancy. 2. Stable nodular density LEFT upper lobe. 3. Mild cardiomegaly. Electronically Signed   By: Awilda Metro M.D.   On: 04/22/2018 03:58   Dg Chest Port 1 View  Result Date: 04/20/2018 CLINICAL DATA:  Fever, multiple complaint EXAM: PORTABLE CHEST 1 VIEW COMPARISON:  01/14/2013 FINDINGS: Mild bilateral interstitial thickening. Hazy left lower lobe airspace disease. Small area of airspace disease in the left upper lobe. Possible small left pleural effusion. No right pleural effusion. No  pneumothorax. Stable cardiomediastinal silhouette. Thoracic aortic atherosclerosis. No acute osseous abnormality. IMPRESSION: 1. Hazy left lower lobe airspace disease. Small area of airspace disease in the left upper lobe. Findings are concerning for an infectious etiology. Electronically Signed   By: Elige KoHetal  Patel   On: 04/20/2018 15:08   Dg Foot Complete Right  Result Date: 04/25/2018 CLINICAL DATA:  Foot pain.  Neuritis.  Assess for osteomyelitis. EXAM: RIGHT FOOT COMPLETE - 3+ VIEW COMPARISON:  None. FINDINGS: Dressing artifact overlies the great toe. I do not see evidence of fracture or dislocation. No sign of osteomyelitis. Detail the distal tuft is poor because of the overlying artifact.  IMPRESSION: Overlying artifact at the great toe. No sign of fracture or osteomyelitis. Electronically Signed   By: Paulina FusiMark  Shogry M.D.   On: 04/25/2018 19:46   Ct Renal Stone Study  Result Date: 04/20/2018 CLINICAL DATA:  58 year old female with abdominal pain EXAM: CT ABDOMEN AND PELVIS WITHOUT CONTRAST TECHNIQUE: Multidetector CT imaging of the abdomen and pelvis was performed following the standard protocol without IV contrast. COMPARISON:  None. FINDINGS: Lower chest: Respiratory motion limits evaluation. Atelectasis at the left lung base. Hepatobiliary: Cranial caudal span of the right liver measures greater than 18 cm. No focal lesion or nodular contour. Cholecystectomy. No intrahepatic or extrahepatic biliary ductal dilatation. Pancreas: Unremarkable pancreas Spleen: Unremarkable spleen Adrenals/Urinary Tract: Unremarkable adrenal glands. Right kidney demonstrates regions of cortical thinning. No hydronephrosis. Vascular calcifications in the hilum of the right kidney. Unremarkable course of the right ureter. Left kidney demonstrates no hydronephrosis. Likely nonobstructive stone in the inferior collecting system, versus vascular calcifications. Unremarkable course the left ureter. Urinary bladder partially distended. Stomach/Bowel: Unremarkable stomach. Unremarkable small bowel. Moderate to large formed stool burden. No focal inflammatory changes. Normal appendix. Vascular/Lymphatic: Vascular calcifications of the aorta and bilateral iliac arteries. Venous collaterals on the anterior pelvic wall favored to reflect chronic left iliac main stenosis/occlusion. Reproductive: Unremarkable appearance of uterus. Other: Hazy edema/infiltration of the musculature associated with the left eye fragment a cruise. There are small gas locules present. This is in continuity with the anterior aspect of the L1-L2 disc space, which appears relatively widened compared to adjacent disc spaces. Musculoskeletal: Vacuum disc  phenomenon at T12-L1. Compression fracture of L1. Irregularity of the inferior endplate with questionable disc space widening of L1-L2. Ill-defined soft tissue extends from the anterior aspect of the L1-L2 disc space into the left eye frag medic cruise. Degenerative changes of the lower lumbar spine. Vacuum disc phenomenon at L4-L5. IMPRESSION: Soft tissue changes of the left diaphragmatic cruz with small gas locules concerning for infection with gas-forming organism. This is in continuity with the anterior aspect of the L1-L2 disc space, and the source may be secondary to discitis/osteomyelitis at this site. Evaluation with MRI with contrast may be useful. Compression fracture of L1 of indeterminate age. These results were discussed by telephone at the time of interpretation on 04/20/2018 at 4:10 pm with Dr. Sharma CovertNorman. Aortic Atherosclerosis (ICD10-I70.0). 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 Correlation with a prior history of DVT may be useful. Hepatomegaly. Electronically Signed   By: Gilmer MorJaime  Wagner D.O.   On: 04/20/2018 16:14   [place] Ekg Site Rite  Result Date: 04/21/2018 If Site Rite image not attached, placement could not be confirmed due to current cardiac rhythm.   Assessment & Plan    Atrial fibrillation-currently in sinus rhythm.  Appears to be tolerating p.o. amiodarone load well.  We will continue with 400 mg twice daily for  2 more days and then reduce to 200 mg twice daily which would be continued for 3 weeks.  Not a candidate for chronic anticoagulation at present as surgical procedure pending.  We will continue with aspirin and hold Plavix per podiatry request today.  Elevated troponin-likely multifactorial to include A. fib with rapid ventricular response as well as septic picture.  Currently without chest pain.  Patient has had no chest pain during admission.  We will continue with current regimen and follow.  Defer any further evaluation until bacteremia and septic picture  resolved.  Peripheral vascular disease-status post left BKA.  Appreciate podiatry's assistance with right toe.  Signed, Darlin Priestly Elin Fenley MD 04/26/2018, 7:52 AM  Pager: [phone]

## 2018-04-26 NOTE — Progress Notes (Signed)
PT Cancellation Note  Patient Details Name: Alexandra Goodman MRN: [id number] DOB: [birth year]   Cancelled Treatment:    Reason Eval/Treat Not Completed: Other (comment). Nurse reports pt does not have TLSO yet and is working on obtaining (TLSO order placed by neurosurgery this morning).  Nurse also reports R great toe bleeding this morning; discussion of possible R great toe amputation.  Will re-attempt PT session at a later date/time as medically appropriate and once pt has TLSO brace.  Hendricks Limes, PT 04/26/18, 12:27 PM [phone]

## 2018-04-26 NOTE — Progress Notes (Signed)
Subjective/Chief Complaint: Patient seen.  Feeling well.   Objective: Vital signs in last 24 hours: Temp:  [98.3 F (36.8 C)-99.9 F (37.7 C)] 98.3 F (36.8 C) (04/07 0731) Pulse Rate:  [81-95] 95 (04/07 0731) Resp:  [16] 16 (04/06 2009) BP: (105-147)/(53-59) 147/59 (04/07 0731) SpO2:  [91 %-96 %] 91 % (04/07 0731) Weight:  [66 kg] 66 kg (04/07 0408) Last BM Date: (unknown)  Intake/Output from previous day: 04/06 0701 - 04/07 0700 In: 240 [P.O.:240] Out: 400 [Urine:400] Intake/Output this shift: No intake/output data recorded.  Significant strikethrough and bleeding noted on the bandaging on the right great toe.  The ulceration looks granular with no cellulitis or edema in the hallux.  Clinically actually looks very good.  X-ray was reviewed which showed the toe to be somewhat obscured by bandaging.  Hard to tell if there is any clear cortical erosions on the distal phalanx of the hallux.  Lab Results:  Recent Labs    04/25/18 0436 04/26/18 0247  WBC 15.3* 19.3*  HGB 10.7* 11.5*  HCT 33.6* 36.8  PLT 349 416*   BMET Recent Labs    04/24/18 0514 04/26/18 0247  NA 136 137  K 3.6 4.7  CL 101 99  CO2 26 27  GLUCOSE 95 59*  BUN 33* 37*  CREATININE 0.70 1.14*  CALCIUM 7.7* 8.6*   PT/INR No results for input(s): LABPROT, INR in the last 72 hours. ABG No results for input(s): PHART, HCO3 in the last 72 hours.  Invalid input(s): PCO2, PO2  Studies/Results: Mr Lumbar Spine W Wo Contrast  Result Date: 04/25/2018 CLINICAL DATA:  Initial evaluation for acute infection, osteomyelitis discitis. EXAM: MRI LUMBAR SPINE WITHOUT AND WITH CONTRAST TECHNIQUE: Multiplanar and multiecho pulse sequences of the lumbar spine were obtained without and with intravenous contrast. CONTRAST:  7 cc of Gadavist. COMPARISON:  Prior CT from 04/20/2018. FINDINGS: Segmentation: Standard. Lowest well-formed disc labeled the L5-S1 level. Alignment: 3 mm anterolisthesis of L4 on L5. Alignment  otherwise normal with preservation of the normal lumbar lordosis. No listhesis or subluxation. Vertebrae: Severe compression deformity involving the L1 vertebral body, subacute in appearance with mild residual edema an enhancement. Associated advanced near complete height loss centrally with trace 4 mm bony retropulsion at the posterior/inferior aspect of the L1 vertebral body. Vertebral body heights otherwise maintained with no other evidence for acute or chronic fracture. Abnormal marrow edema and enhancement seen involving the L2 vertebral body, most notable on the left, compatible with acute osteomyelitis. Extension into the left greater than right posterior elements of L2. Additional changes consistent with osteomyelitis within the left pedicle of L3. Probable associated septic arthritis involving the intervening L2-3 facet. Edema and enhancement within the L1 vertebral body could be related to fracture and/or infection. Probable subtle changes of mild osteomyelitis at the inferior endplate of T12 as well. Fluid density within the T12-L1 and L1-2 interspace is suspicious for associated discitis. Possible subtle changes at L2-3 as well. Associated epidural phlegmon/abscess involving the central and left ventral epidural space extending from L2-3 through L4-5 (series 11, image 9). Additional epidural phlegmon/abscess seen at the right dorsal epidural space at the level of L2-3 (series 14, image 9). Mild epidural phlegmon extends cephalad to the level of approximately T12 (series 11, image 8). Conus medullaris and cauda equina: Conus extends to the L1 level. Thin leptomeningeal enhancement about the conus medullaris, likely infectious in nature. Paraspinal and other soft tissues: Extensive paraspinous edema within the bilateral psoas and left posterior  paraspinous musculature. Superimposed multifocal soft tissue abscesses are seen. Largest of these within the left posterior paraspinous musculature measures  approximately 2.6 x 3.0 cm (series 15, image 18). Largest abscess involving the left psoas muscle measures approximately 12 mm and is seen at the level of L2-3 (series 15, image 18). Largest involving the right psoas muscle measures 14 x 12 mm and is also seen at the level of L2-3 (series 15, image 18). Involvement of the left crus of diaphragm with associated 13 mm abscess noted (series 15, image 4), similar to prior CT. Multifocal cortical thinning and scarring noted within the right kidney. Scattered superimposed renal cysts noted bilaterally. Visualized visceral structures otherwise unremarkable. Disc levels: T12-L1: Mild disc bulge. Epidural phlegmon/abscess at the posterior epidural space. No significant stenosis. L1-2: L1 compression deformity with up to 4 mm bony retropulsion. Associated diffuse disc bulge with intervertebral disc space narrowing. Mild bilateral facet hypertrophy. Superimposed epidural phlegmon/abscess, most notable at the right posterolateral epidural space. Resultant mild spinal stenosis. Mild bilateral L1 foraminal narrowing. L2-3: Minimal disc bulge. Mild to moderate facet hypertrophy. Epidural phlegmon/abscess, extending into the left L2-3 neural foramen. Central canal remains patent. Mild left foraminal stenosis. L3-4: Diffuse disc bulge with intervertebral disc space narrowing. Moderate facet hypertrophy. Epidural phlegmon/abscess within the central/left ventral epidural space. Resultant moderate canal with severe left lateral recess stenosis. Mild left foraminal narrowing. L4-5: Anterolisthesis. Diffuse disc bulge with disc desiccation. Epidural phlegmon/abscess involves the ventral epidural space, more notable on the left. Moderate facet arthrosis. Resultant moderate canal with left greater than right lateral recess stenosis. Foramina remain patent. L5-S1: Mild diffuse disc bulge. Mild facet hypertrophy. Resultant mild left lateral recess narrowing. Central canal remains patent. No  significant foraminal encroachment. IMPRESSION: 1. Findings consistent with acute osteomyelitis involving the L2 and L3 vertebral bodies with probable septic arthritis involving the left L2-3 facet. Mild marrow edema involving the T12 and L1 vertebral bodies concerning for early/mild involvement as well. Suspected early changes of discitis in the intervening T12-L1 through L2-3 interspaces. 2. Associated epidural phlegmon/abscess extending from T12-L1 through L4-5 as above. Changes most pronounced at the L3-4 level were there is resultant moderate canal with severe left lateral recess stenosis. 3. Associated paraspinous phlegmon with soft tissue abscesses involving the bilateral psoas musculature, left crus of diaphragm, and left posterior paraspinous musculature as above. 4. L1 compression deformity with advanced height loss and up to 4 mm bony retropulsion, subacute in appearance. Electronically Signed   By: Rise Mu M.D.   On: 04/25/2018 18:29   Dg Foot Complete Right  Result Date: 04/25/2018 CLINICAL DATA:  Foot pain.  Neuritis.  Assess for osteomyelitis. EXAM: RIGHT FOOT COMPLETE - 3+ VIEW COMPARISON:  None. FINDINGS: Dressing artifact overlies the great toe. I do not see evidence of fracture or dislocation. No sign of osteomyelitis. Detail the distal tuft is poor because of the overlying artifact. IMPRESSION: Overlying artifact at the great toe. No sign of fracture or osteomyelitis. Electronically Signed   By: Paulina Fusi M.D.   On: 04/25/2018 19:46    Anti-infectives: Anti-infectives (From admission, onward)   Start     Dose/Rate Route Frequency Ordered Stop   04/21/18 2000  vancomycin (VANCOCIN) IVPB 1000 mg/200 mL premix  Status:  Discontinued     1,000 mg 200 mL/hr over 60 Minutes Intravenous Every 24 hours 04/20/18 1646 04/21/18 0249   04/21/18 2000  nafcillin 2 g in sodium chloride 0.9 % 100 mL IVPB     2 g  200 mL/hr over 30 Minutes Intravenous Every 4 hours 04/21/18 1835      04/21/18 0600  ceFAZolin (ANCEF) IVPB 2g/100 mL premix  Status:  Discontinued     2 g 200 mL/hr over 30 Minutes Intravenous Every 8 hours 04/21/18 0251 04/21/18 1835   04/20/18 2300  piperacillin-tazobactam (ZOSYN) IVPB 3.375 g  Status:  Discontinued     3.375 g 12.5 mL/hr over 240 Minutes Intravenous Every 8 hours 04/20/18 1646 04/21/18 0249   04/20/18 2000  vancomycin (VANCOCIN) IVPB 750 mg/150 ml premix     750 mg 150 mL/hr over 60 Minutes Intravenous  Once 04/20/18 1646 04/20/18 2255   04/20/18 1500  vancomycin (VANCOCIN) IVPB 1000 mg/200 mL premix     1,000 mg 200 mL/hr over 60 Minutes Intravenous  Once 04/20/18 1457 04/20/18 1627   04/20/18 1500  piperacillin-tazobactam (ZOSYN) IVPB 3.375 g     3.375 g 100 mL/hr over 30 Minutes Intravenous  Once 04/20/18 1457 04/20/18 1559      Assessment/Plan: s/p * No surgery found * Assessment: Full-thickness ulceration right hallux with possibility of osteomyelitis not excluded.   Plan: Dressing reapplied to the right great toe.  Discussed with the patient that her toe clinically looks good although there still may be some infection in the bone.  I did review the results from her back MRI which show evidence for osteomyelitis in her spine.  I will speak with Dr. Joylene Draft about her case.  We could consider amputation tomorrow and we will hold her Plavix.  Also not completely convinced that her great toe is the source of her infection or significant problem at this point though.  LOS: 6 days    Ricci Barker 04/26/2018

## 2018-04-26 NOTE — Treatment Plan (Signed)
Diagnosis: Staph aureus bacteremia with lumbar discitis Baseline Creatinine     Allergies  Allergen Reactions  . Skin Protectants, Misc. Rash  . Flagyl [Metronidazole]   . Gabapentin   . Levetiracetam   . Tape Other (See Comments)    blisters  . Ace Inhibitors Rash  . Ertapenem Rash  . Sertraline Hcl Rash    OPAT Orders Discharge antibiotics: Cefazolin 2 grams IV every 8 hours  End Date 06/04/18  [facility] Care Per Protocol:  Labs weekly on Monday while on IV antibiotics: _X_ CBC with differential X__ CMP Labs once every 2 weeks on Monday while on IV antibiotic _X_ CRP _X_ ESR   _X_ Please pull PIC at completion of IV antibiotics   Fax weekly labs to [phone]  Clinic Follow Up Appt:4 weeks  Call [phone] to make appt

## 2018-04-26 NOTE — Consult Note (Signed)
Neurosurgery-New Consultation Evaluation 04/26/2018 Alexandra Goodman [id number]  Identifying Statement: Alexandra Goodman is a 58 y.o. female from [place] [address] with sepsis  Physician Requesting Consultation: Dr. Allena Katz  History of Present Illness: [name] is admitted to the hospital since 4/1 for management of systemic infection.  She was noted to have a positive UTI followed by positive blood cultures consistent with MSSA and is currently being treated for this.  She is noted to have an ulcerated lesion on her right toe which has been treated and is likely being the source of this infection.  She was complaining of back pain and some pain into the groin which prompted imaging of her spine which revealed likely discitis and osteomyelitis at L1-2.  She is not endorsing any other new symptoms but does have history of chronic numbness in her lower extremities and a left below-knee amputation.  MRI of the lumbar spine was obtained with contrast and it does show some epidural tissue concerning for phlegmon infection.  There is no obvious compression above the level of the conus.  There is some stenosis in the mid lumbar region.  History of smoking but states she has not had cigarettes for 2 weeks.  She is on Plavix and anticoagulation for previous stroke and vascular disease.    Past Medical History:  Past Medical History:  Diagnosis Date  . Asthma   . CHF (congestive heart failure) (HCC)   . Chronic pain   . COPD (chronic obstructive pulmonary disease) (HCC)   . Diabetes mellitus (HCC)   . Peripheral vascular disease (HCC)   . Stroke (cerebrum) [facility])     Social History: Social History   Socioeconomic History  . Marital status: Single    Spouse name: Not on file  . Number of children: Not on file  . Years of education: Not on file  . Highest education level: Not on file  Occupational History  . Not on file  Social Needs  . Financial resource strain: Not on file  . Food  insecurity:    Worry: Not on file    Inability: Not on file  . Transportation needs:    Medical: Not on file    Non-medical: Not on file  Tobacco Use  . Smoking status: Current Every Day Smoker    Packs/day: 2.00    Years: 45.00    Pack years: 90.00    Types: Cigarettes  . Smokeless tobacco: Never Used  Substance and Sexual Activity  . Alcohol use: Not Currently  . Drug use: Never  . Sexual activity: Not on file  Lifestyle  . Physical activity:    Days per week: Not on file    Minutes per session: Not on file  . Stress: Not on file  Relationships  . Social connections:    Talks on phone: Not on file    Gets together: Not on file    Attends religious service: Not on file    Active member of club or organization: Not on file    Attends meetings of clubs or organizations: Not on file    Relationship status: Not on file  . Intimate partner violence:    Fear of current or ex partner: Not on file    Emotionally abused: Not on file    Physically abused: Not on file    Forced sexual activity: Not on file  Other Topics Concern  . Not on file  Social History Narrative  . Not on file  Family History: Family History  Problem Relation Age of Onset  . Heart disease Mother   . Heart disease Father     Review of Systems:  Review of Systems - General ROS: Negative Psychological ROS: Negative Ophthalmic ROS: Negative ENT ROS: Negative Hematological and Lymphatic ROS: Negative  Endocrine ROS: Negative Respiratory ROS: Negative Cardiovascular ROS: Negative Gastrointestinal ROS: Negative Genito-Urinary ROS: Negative Musculoskeletal ROS: Positive for back pain Neurological ROS: Positive for chronic numbness in legs Dermatological ROS: Negative  Physical Exam: BP (!) 147/59 (BP Location: Right Arm)   Pulse 95   Temp 98.3 F (36.8 C) (Oral)   Resp 16   Ht 5\' 3"  (1.6 m)   Wt 66 kg   SpO2 91%   BMI 25.77 kg/m  Body mass index is 25.77 kg/m. Body surface area is 1.71  meters squared. General appearance: Alert, cooperative, in no acute distress Head: Normocephalic, atraumatic Eyes: Normal, EOM intact Oropharynx: Moist without lesions Ext: No edema in LE bilaterally, cool to touch, left BKA, right toe bandaged with spotting on bandage  Neurologic exam:  Mental status: alertness: alert, affect: normal, lying supine in bed Speech: fluent and clear Motor:strength symmetric 5/5 in bilateral hip flexion, knee extension, knee flexion.  On the right lower extremity she is 5-5 in plantarflexion and dorsiflexion Sensory: intact to light touch in all extremities Reflexes: 1+ and symmetric at bilateral patella Gait: not tested   Imaging: MRI lumbar spine: 1. Findings consistent with acute osteomyelitis involving the L2 and L3 vertebral bodies with probable septic arthritis involving the left L2-3 facet. Mild marrow edema involving the T12 and L1 vertebral bodies concerning for early/mild involvement as well. Suspected early changes of discitis in the intervening T12-L1 through L2-3 interspaces. 2. Associated epidural phlegmon/abscess extending from T12-L1 through L4-5 as above. Changes most pronounced at the L3-4 level were there is resultant moderate canal with severe left lateral recess stenosis. 3. Associated paraspinous phlegmon with soft tissue abscesses involving the bilateral psoas musculature, left crus of diaphragm, and left posterior paraspinous musculature as above. 4. L1 compression deformity with advanced height loss and up to 4 mm bony retropulsion, subacute in appearance.   Impression/Plan:  Ms. Defrancesco is admitted for sepsis and noted to have involvement of the spine and paraspinal regions in the thoracic and lumbar area.  He does also have a deformity at L1 which consistent with a compression fracture versus infection changes.  At this time, she does not appear to have any new neurologic symptoms and this is reassuring.  Given the fracture and height  loss, I do recommend a TLSO brace which has been ordered.  She should wear this whenever she is up out of bed.  I would like to get upright x-rays to visualize for any changes in alignment or curvature.  Given no neurologic symptoms at this time and positive microorganism, I do recommend continue with IV antibiotic treatment for period of 4 to 6 weeks for reevaluation of the lumbar spine.  Should she develop any neurologic symptoms during the interim, recommend more urgent evaluation.  MSSA in the lumbar spine can be responsive to the antibiotics.  I did discuss with her that smoking can impede the fracture healing and encouraged her to abstain from this.  I would like to see her back in clinic for follow-up.   1.  Diagnosis: Bacteremia with lumbar discitis/osteomyelitis and epidural phlegmon  2.  Plan -No urgent surgical intervention -TLSO recommended for pain and healing, commend wearing whenever out  of bed but may be removed for shower -Upright x-rays of the lumbar spine needed before discharge to allow for comparison to future imaging -Need to be seen in my clinic in 5 to 6 weeks for reevaluation

## 2018-04-26 NOTE — Progress Notes (Signed)
Inpatient Diabetes Program Recommendations  AACE/ADA: New Consensus Statement on Inpatient Glycemic Control  Target Ranges:  Prepandial:   less than 140 mg/dL      Peak postprandial:   less than 180 mg/dL (1-2 hours)      Critically ill patients:  140 - 180 mg/dL   Results for Alexandra Goodman, Alexandra Goodman (MRN [id number]) as of 04/26/2018 08:49  Ref. Range 04/25/2018 07:43 04/25/2018 11:47 04/25/2018 17:00 04/25/2018 20:39 04/26/2018 07:30 04/26/2018 08:11  Glucose-Capillary Latest Ref Range: 70 - 99 mg/dL 060 (H) 045 (H) 997 (H) 71 45 (L) 102 (H)    Review of Glycemic Control  Diabetes history: DM2 Outpatient Diabetes medications: Lantus 46 units QHS, Metformin 1000 mg BID Current orders for Inpatient glycemic control: Novolog 0-15 units TID with meals, Novolog 0-5 units QHS  Inpatient Diabetes Program Recommendations:  Insulin - Basal: Noted fasting glucose 45 mg/dl today. Patient received Lantus 46 units on 04/25/18 and Lantus has been discontinued.  Please consider reordering Lantus at 35 units QHS.  Thanks, Orlando Penner, RN, MSN, CDE Diabetes Coordinator Inpatient Diabetes Program [phone] (Team Pager from 8am to 5pm)

## 2018-04-27 ENCOUNTER — Inpatient Hospital Stay: Payer: Self-pay

## 2018-04-27 LAB — GLUCOSE, CAPILLARY
Glucose-Capillary: 91 mg/dL (ref 70–99)
Glucose-Capillary: 218 mg/dL — ABNORMAL HIGH (ref 70–99)

## 2018-04-27 MED ORDER — ASPIRIN 81 MG PO TBEC: 81.0000 mg | DELAYED_RELEASE_TABLET | Freq: Every day | ORAL | 0 refills | Status: AC

## 2018-04-27 MED ORDER — AMIODARONE HCL 200 MG PO TABS: 200.0000 mg | ORAL_TABLET | Freq: Two times a day (BID) | ORAL | 0 refills | Status: AC

## 2018-04-27 MED ORDER — OXYCODONE-ACETAMINOPHEN 10-325 MG PO TABS: 1.0000 | ORAL_TABLET | Freq: Four times a day (QID) | ORAL | 0 refills | Status: AC | PRN

## 2018-04-27 MED ORDER — CEFAZOLIN IV (FOR PTA / DISCHARGE USE ONLY): 2.0000 g | Freq: Three times a day (TID) | INTRAVENOUS | 0 refills | Status: AC

## 2018-04-27 MED ORDER — MORPHINE SULFATE ER 30 MG PO TBCR: 30.0000 mg | EXTENDED_RELEASE_TABLET | Freq: Two times a day (BID) | ORAL | 0 refills | Status: AC

## 2018-04-27 NOTE — Progress Notes (Signed)
Peripherally Inserted Central Catheter/Midline Placement  The IV Nurse has discussed with the patient and/or persons authorized to consent for the patient, the purpose of this procedure and the potential benefits and risks involved with this procedure.  The benefits include less needle sticks, lab draws from the catheter, and the patient may be discharged home with the catheter. Risks include, but not limited to, infection, bleeding, blood clot (thrombus formation), and puncture of an artery; nerve damage and irregular heartbeat and possibility to perform a PICC exchange if needed/ordered by physician.  Alternatives to this procedure were also discussed.  Bard Power PICC patient education guide, fact sheet on infection prevention and patient information card has been provided to patient /or left at bedside.    PICC/Midline Placement Documentation  PICC Single Lumen 04/27/18 PICC Right Brachial 42 cm 0 cm (Active)  Indication for Insertion or Continuance of Line Home intravenous therapies (PICC only) 04/27/2018  1:22 PM  Exposed Catheter (cm) 0 cm 04/27/2018  1:22 PM  Site Assessment Clean;Dry;Intact 04/27/2018  1:22 PM  Line Status Flushed;Blood return noted 04/27/2018  1:22 PM  Dressing Type Transparent 04/27/2018  1:22 PM  Dressing Status Clean;Dry;Intact;Antimicrobial disc in place 04/27/2018  1:22 PM  Dressing Intervention New dressing 04/27/2018  1:22 PM  Dressing Change Due 05/04/18 04/27/2018  1:22 PM    See previous consent   [name] 04/27/2018, 1:23 PM

## 2018-04-27 NOTE — TOC Transition Note (Signed)
Transition of Care [facility]) - CM/SW Discharge Note   Patient Details  Name: Alexandra Goodman MRN: [id number] Date of Birth: 1960/07/04  Transition of Care [facility]) CM/SW Contact:  Sherren Kerns, RN Phone Number: 04/27/2018, 1:54 PM   Clinical Narrative:   Patient is discharging to home today with IV abx.  Teaching has been completed by Jeri Modena, RN with [facility].   Barbara Cower with [facility] is aware of patient discharge.        Final next level of care: Home w Home Health Services Barriers to Discharge: No Barriers Identified   Patient Goals and CMS Choice        Discharge Placement                       Discharge Plan and Services   Discharge Planning Services: CM Consult                      Social Determinants of Health (SDOH) Interventions     Readmission Risk Interventions Readmission Risk Prevention Plan 04/25/2018  Transportation Screening Complete  PCP or Specialist Appt within 3-5 Days Complete  HRI or Home Care Consult Complete  Social Work Consult for Recovery Care Planning/Counseling Patient refused  Palliative Care Screening Not Applicable  Medication Review [employer]) Complete  Some recent data might be hidden

## 2018-04-27 NOTE — Discharge Summary (Addendum)
[facility] NAME: Alexandra Goodman    MR#:  [id number]  DATE OF BIRTH:  17-Mar-1960  DATE OF ADMISSION:  04/20/2018 ADMITTING PHYSICIAN: Henreitta Leber, MD  DATE OF DISCHARGE: No discharge date for patient encounter.  PRIMARY CARE PHYSICIAN: Kimel-Scott, Santiago Glad, MD   ADMISSION DIAGNOSIS:  Lactic acidosis [E87.2] Hyperglycemia [R73.9] Community acquired pneumonia of left lower lobe of lung ([place]) [J18.1] Sepsis ([place]) [A41.9]  DISCHARGE DIAGNOSIS:  Active Problems:   Sepsis ([place])   Pressure injury of skin MSSA bacteremia L1-L2 discitis Right great toe ulcer Atrial fibrillation COPD with exacerbation Elevated troponin secondary to demand ischemia L2-L3 vertebral osteomyelitis  SECONDARY DIAGNOSIS:   Past Medical History:  Diagnosis Date  . Asthma   . CHF (congestive heart failure) ([place])   . Chronic pain   . COPD (chronic obstructive pulmonary disease) ([place])   . Diabetes mellitus ([place])   . Peripheral vascular disease ([place])   . Stroke (cerebrum) [facility])      ADMITTING HISTORY Alexandra Goodman  is a 58 y.o. female with a known history of CHF, COPD, hypertension, diabetes, peripheral vascular disease, diabetic neuropathy, pervious hx of DVT who presents to the hospital as advised by her primary care physician as they thought she had a urinary tract infection and also kidney stones.  Patient says that she has not been feeling well for the past week or so and went to see her primary care physician at [facility] she was complaining more of lower back pain and urinary frequency/hesitancy and they did a urinalysis which was not grossly positive but send off urine cultures and it turned out to be positive for staph aureus.  Patient was advised to come to the ER for further evaluation.  Patient also says that she has been having some abdominal pain but no nausea or vomiting and the pain is located more bilaterally on her flank sides.  She admits to some  chills but no documented fever.  She has chronic shortness of breath and it has not been any worse than usual.  She also admits to recent travel history she went to [place] last month for a few days to visit family as her father had passed away.  She said she was feeling fine when she came back from [place] and the symptoms have only begun about less than a week ago.  She presented to the ER and was noted to be in mild acute kidney injury with severe hyperglycemia with leukocytosis and elevated lactic acid and ruled in for sepsis.  Patient's urinalysis is mildly positive and underwent a CT renal stone study which showed no nephrolithiasis but did show evidence of possible discitis/osteomyelitis in the L1-L2 area.  Hospitalist services were contacted for admission.  HOSPITAL COURSE:  Patient was initially admitted for sepsis with elevated WBC count and lactic acid.  Initially pneumonia was thought to be suspected and patient started on broad-spectrum antibiotics that is IV vancomycin and Zosyn.  Suspicion was also for osteomyelitis and discitis based on CT scan.  Patient was worked up with MRI lumbar spine.. Elevated troponin secondary to demand ischemia.  Patient was put on droplet and contact precautions and COVID-19 test was sent out.  COVID-19 test came negative.  Patient also had a prolonged stay in the ICU for acute hypoxic respiratory failure.  She was on mechanical ventilation and intensivist evaluation and management was done.  Patient had a DKA during the ICU stay and was treated  with IV insulin drip.  Cardizem was also given intravenously for atrial fibrillation during the stay in the ICU. Patient's blood cultures grew MSSA.  Antibiotics were switched to cefazolin.  Infectious disease consultant was involved.  MRI spine revealed L1-L2 discitis.  Patient needs prolonged IV antibiotic therapy PICC line was placed.  Podiatry's service has evaluated patient's right great toe.  Recommended no  amputation or surgery advised to continue antibiotics and follow-up in the clinic.  Patient received steroids and nebulization treatments for COPD exacerbation which improved.Patient is on oxygen via nasal cannula at 3 to 4 L.  Patient refused transesophageal echocardiogram.  Patient has bacteremia not an ideal candidate for PCI or CABG according to cardiology.  Advised antianginal medication and antiplatelet therapy.  Advised to continue warfarin and aspirin.  Further work-up once bacteremia has resolved. Transthoracic echocardiogram 1. The left ventricle has normal systolic function with an ejection fraction of 60-65%. The cavity size was normal. Left ventricular diastolic parameters were normal.  2. The right ventricle has normal systolic function. The cavity was normal. There is no increase in right ventricular wall thickness.  3. No evidence present in the left atrial appendage.  4. The aortic valve is tricuspid. Aortic valve regurgitation is trivial by color flow Doppler.  5. No pulmonic valve vegetation visualized.  CONSULTS OBTAINED:  Treatment Team:  Teodoro Spray, MD Darliss Cheney DRUG ALLERGIES:   Allergies  Allergen Reactions  . Skin Protectants, Misc. Rash  . Flagyl [Metronidazole]   . Gabapentin   . Levetiracetam   . Tape Other (See Comments)    blisters  . Ace Inhibitors Rash  . Ertapenem Rash  . Sertraline Hcl Rash    DISCHARGE MEDICATIONS:   Allergies as of 04/27/2018      Reactions   Skin Protectants, Misc. Rash   Flagyl [metronidazole]    Gabapentin    Levetiracetam    Tape Other (See Comments)   blisters   Ace Inhibitors Rash   Ertapenem Rash   Sertraline Hcl Rash      Medication List    STOP taking these medications   amLODipine 5 MG tablet Commonly known as:  NORVASC   metoprolol succinate 100 MG 24 hr tablet Commonly known as:  TOPROL-XL     TAKE these medications   Advair HFA 230-21 MCG/ACT inhaler Generic drug:   fluticasone-salmeterol Inhale 2 puffs into the lungs 2 (two) times daily.   amiodarone 200 MG tablet Commonly known as:  PACERONE Take 1 tablet (200 mg total) by mouth 2 (two) times daily for 30 days.   aspirin 81 MG EC tablet Take 1 tablet (81 mg total) by mouth daily for 30 days. Start taking on:  April 28, 2018   atorvastatin 80 MG tablet Commonly known as:  LIPITOR Take 80 mg by mouth Nightly.   ceFAZolin  IVPB Commonly known as:  ANCEF Inject 2 g into the vein every 8 (eight) hours. Indication:  MSSA bacteremia and lumbar discitis Last Day of Therapy:  06/04/2018 Labs - Once weekly (on Mon):  CBC/D and CMP, Labs - Every other week (on Mon):  ESR and CRP   clopidogrel 75 MG tablet Commonly known as:  PLAVIX Take 75 mg by mouth daily.   famotidine 40 MG tablet Commonly known as:  PEPCID Take 40 mg by mouth every evening.   FLUoxetine 20 MG capsule Commonly known as:  PROZAC Take 20 mg by mouth daily.   fluticasone 50 MCG/ACT nasal spray Commonly  known as:  FLONASE Place 1 spray into both nostrils daily.   furosemide 40 MG tablet Commonly known as:  LASIX Take 40-80 mg by mouth 2 (two) times daily. 80 mg every morning and 40 mg every evening   Lantus SoloStar 100 UNIT/ML Solostar Pen Generic drug:  Insulin Glargine Inject 46 Units into the skin at bedtime.   levothyroxine 88 MCG tablet Commonly known as:  SYNTHROID, LEVOTHROID Take 88 mcg by mouth daily.   losartan 100 MG tablet Commonly known as:  COZAAR Take 100 mg by mouth daily.   metFORMIN 1000 MG tablet Commonly known as:  GLUCOPHAGE Take 1,000 mg by mouth 2 (two) times daily.   mirabegron ER 25 MG Tb24 tablet Commonly known as:  MYRBETRIQ Take 25 mg by mouth daily.   montelukast 10 MG tablet Commonly known as:  SINGULAIR Take 10 mg by mouth daily.   morphine 30 MG 12 hr tablet Commonly known as:  MS CONTIN Take 1 tablet (30 mg total) by mouth every 12 (twelve) hours for 5 days.   naloxone 4  MG/0.1ML Liqd nasal spray kit Commonly known as:  NARCAN Place 4 mg into the nose once.   nitroGLYCERIN 0.4 MG SL tablet Commonly known as:  NITROSTAT Place 0.4 mg under the tongue as needed.   oxyCODONE-acetaminophen 10-325 MG tablet Commonly known as:  PERCOCET Take 1 tablet by mouth every 6 (six) hours as needed for pain. What changed:    when to take this  reasons to take this   predniSONE 20 MG tablet Commonly known as:  DELTASONE Take 40 mg by mouth daily.   ProAir HFA 108 (90 Base) MCG/ACT inhaler Generic drug:  albuterol Inhale 2 puffs into the lungs every 4 (four) hours as needed for wheezing.   senna-docusate 8.6-50 MG tablet Commonly known as:  Senokot-S Take 2 tablets by mouth daily.   Spiriva HandiHaler 18 MCG inhalation capsule Generic drug:  tiotropium Place 1 capsule into inhaler and inhale daily.   traZODone 50 MG tablet Commonly known as:  DESYREL Take 150 mg by mouth at bedtime.   warfarin 5 MG tablet Commonly known as:  COUMADIN Take 7.5 mg by mouth daily.            Home Infusion Instuctions  (From admission, onward)         Start     Ordered   04/27/18 0000  Home infusion instructions [facility] May follow [place] Dosing Protocol; May administer Cathflo as needed to maintain patency of vascular access device.; Flushing of vascular access device: per [facility] Protocol: 0.9% NaCl pre/post medica...    Question Answer Comment  Instructions May follow [place] Dosing Protocol   Instructions May administer Cathflo as needed to maintain patency of vascular access device.   Instructions Flushing of vascular access device: per [facility] Protocol: 0.9% NaCl pre/post medication administration and prn patency; Heparin 100 u/ml, 46m for implanted ports and Heparin 10u/ml, 572mfor all other central venous catheters.   Instructions May follow [facility] Anaphylaxis Protocol for First Dose Administration in the home: 0.9% NaCl at 25-50 ml/hr to maintain  IV access for protocol meds. Epinephrine 0.3 ml IV/IM PRN and Benadryl 25-50 IV/IM PRN s/s of anaphylaxis.   Instructions [facility] Infusion Coordinator (RN) to assist per patient IV care needs in the home PRN.      04/27/18 1305          Today   VITAL SIGNS:  Blood pressure (!Marland Kitchen  130/52, pulse 79, temperature 98.2 F (36.8 C), temperature source Oral, resp. rate 20, height _0  (1.6 m), weight 66 kg, SpO2 90 %.  I/O:    Intake/Output Summary (Last 24 hours) at 04/27/2018 1408 Last data filed at 04/27/2018 1024 Gross per 24 hour  Intake 504.44 ml  Output -  Net 504.44 ml    PHYSICAL EXAMINATION:  Physical Exam  GENERAL:  58 y.o.-year-old patient lying in the bed with no acute distress.  LUNGS: Normal breath sounds bilaterally, no wheezing, rales,rhonchi or crepitation. No use of accessory muscles of respiration.  CARDIOVASCULAR: S1, S2 normal. No murmurs, rubs, or gallops.  ABDOMEN: Soft, non-tender, non-distended. Bowel sounds present. No organomegaly or mass.  Extremities : Left BKA NEUROLOGIC: Moves all 4 extremities. PSYCHIATRIC: The patient is alert and oriented x 3.  SKIN: No obvious rash, lesion, or ulcer.   DATA REVIEW:   CBC Recent Labs  Lab 04/26/18 0247  WBC 19.3*  HGB 11.5*  HCT 36.8  PLT 416*    Chemistries  Recent Labs  Lab 04/20/18 1429  04/23/18 0255  04/26/18 0247  NA 124*   < > 141   < > 137  K 5.0   < > 3.4*   < > 4.7  CL 88*   < > 104   < > 99  CO2 25   < > 27   < > 27  GLUCOSE 650*   < > 72   < > 59*  BUN 62*   < > 33*   < > 37*  CREATININE 1.16*   < > 0.71   < > 1.14*  CALCIUM 8.9   < > 8.3*   < > 8.6*  MG  --    < > 2.0  --   --   AST 21  --   --   --   --   ALT 22  --   --   --   --   ALKPHOS 152*  --   --   --   --   BILITOT 0.7  --   --   --   --    < > = values in this interval not displayed.    Cardiac Enzymes Recent Labs  Lab 04/23/18 0255  TROPONINI 22.80*    Microbiology Results  Results for orders  placed or performed during the hospital encounter of 04/20/18  Blood Culture (routine x 2)     Status: Abnormal   Collection Time: 04/20/18  2:46 PM  Result Value Ref Range Status   Specimen Description   Final    BLOOD BLOOD RIGHT FOREARM Performed at [facility], [address] [place], [place] [address]    Special Requests   Final    BOTTLES DRAWN AEROBIC AND ANAEROBIC Blood Culture results may not be optimal due to an excessive volume of blood received in culture bottles Performed at [facility], [address] [place], [place] [address]    Culture  Setup Time   Final    GRAM POSITIVE COCCI IN BOTH AEROBIC AND ANAEROBIC BOTTLES CRITICAL RESULT CALLED TO, READ BACK BY AND VERIFIED WITH: SHEEMA HALLAJI 04/21/2018 222 REC Performed at [facility] [address] [place], [place] [address]    Culture STAPHYLOCOCCUS AUREUS (A)  Final   Report Status 04/23/2018 FINAL  Final   Organism ID, Bacteria STAPHYLOCOCCUS AUREUS  Final      Susceptibility   Staphylococcus aureus - MIC*  CIPROFLOXACIN >=8 RESISTANT Resistant     ERYTHROMYCIN >=8 RESISTANT Resistant     GENTAMICIN <=0.5 SENSITIVE Sensitive     OXACILLIN <=0.25 SENSITIVE Sensitive     TETRACYCLINE <=1 SENSITIVE Sensitive     VANCOMYCIN 1 SENSITIVE Sensitive     TRIMETH/SULFA <=10 SENSITIVE Sensitive     CLINDAMYCIN >=8 RESISTANT Resistant     RIFAMPIN <=0.5 SENSITIVE Sensitive     Inducible Clindamycin NEGATIVE Sensitive     * STAPHYLOCOCCUS AUREUS  Blood Culture (routine x 2)     Status: Abnormal   Collection Time: 04/20/18  2:46 PM  Result Value Ref Range Status   Specimen Description   Final    BLOOD LEFT ANTECUBITAL Performed at [facility], [address], [place], [place] [address]    Special Requests   Final    BOTTLES DRAWN AEROBIC AND ANAEROBIC Blood Culture adequate volume Performed at [facility], [address] [place], [place] [address]    Culture   Setup Time   Final    GRAM POSITIVE COCCI IN BOTH AEROBIC AND ANAEROBIC BOTTLES CRITICAL VALUE NOTED.  VALUE IS CONSISTENT WITH PREVIOUSLY REPORTED AND CALLED VALUE. Performed at [facility], [address] [place], [place] [address]    Culture (A)  Final    STAPHYLOCOCCUS AUREUS SUSCEPTIBILITIES PERFORMED ON PREVIOUS CULTURE WITHIN THE LAST 5 DAYS. Performed at [facility], [place] [address], [place], [place] [address]    Report Status 04/23/2018 FINAL  Final  Urine culture     Status: Abnormal   Collection Time: 04/20/18  2:46 PM  Result Value Ref Range Status   Specimen Description   Final    URINE, RANDOM Performed at [facility], [address] [place], [place] [address]    Special Requests   Final    NONE Performed at [facility], [place], [place] [address]    Culture >=100,000 COLONIES/mL STAPHYLOCOCCUS AUREUS (A)  Final   Report Status 04/22/2018 FINAL  Final   Organism ID, Bacteria STAPHYLOCOCCUS AUREUS (A)  Final      Susceptibility   Staphylococcus aureus - MIC*    CIPROFLOXACIN >=8 RESISTANT Resistant     GENTAMICIN <=0.5 SENSITIVE Sensitive     NITROFURANTOIN <=16 SENSITIVE Sensitive     OXACILLIN <=0.25 SENSITIVE Sensitive     TETRACYCLINE >=16 RESISTANT Resistant     VANCOMYCIN 1 SENSITIVE Sensitive     TRIMETH/SULFA <=10 SENSITIVE Sensitive     CLINDAMYCIN >=8 RESISTANT Resistant     RIFAMPIN <=0.5 SENSITIVE Sensitive     Inducible Clindamycin NEGATIVE Sensitive     * >=100,000 COLONIES/mL STAPHYLOCOCCUS AUREUS  Blood Culture ID Panel (Reflexed)     Status: Abnormal   Collection Time: 04/20/18  2:46 PM  Result Value Ref Range Status   Enterococcus species NOT DETECTED NOT DETECTED Final   Listeria monocytogenes NOT DETECTED NOT DETECTED Final   Staphylococcus species DETECTED (A) NOT DETECTED Final    Comment: CRITICAL RESULT CALLED TO, READ BACK BY AND VERIFIED WITH: SHEEMA HALLAJI 04/21/2018 222 REC     Staphylococcus aureus (BCID) DETECTED (A) NOT DETECTED Final    Comment: Methicillin (oxacillin) susceptible Staphylococcus aureus (MSSA). Preferred therapy is anti staphylococcal beta lactam antibiotic (Cefazolin or Nafcillin), unless clinically contraindicated. CRITICAL RESULT CALLED TO, READ BACK BY AND VERIFIED WITH: SHEEMA HALLAJI 04/21/2018 222 REC    Methicillin resistance NOT DETECTED NOT DETECTED Final   Streptococcus species NOT DETECTED NOT DETECTED Final  Streptococcus agalactiae NOT DETECTED NOT DETECTED Final   Streptococcus pneumoniae NOT DETECTED NOT DETECTED Final   Streptococcus pyogenes NOT DETECTED NOT DETECTED Final   Acinetobacter baumannii NOT DETECTED NOT DETECTED Final   Enterobacteriaceae species NOT DETECTED NOT DETECTED Final   Enterobacter cloacae complex NOT DETECTED NOT DETECTED Final   Escherichia coli NOT DETECTED NOT DETECTED Final   Klebsiella oxytoca NOT DETECTED NOT DETECTED Final   Klebsiella pneumoniae NOT DETECTED NOT DETECTED Final   Proteus species NOT DETECTED NOT DETECTED Final   Serratia marcescens NOT DETECTED NOT DETECTED Final   Haemophilus influenzae NOT DETECTED NOT DETECTED Final   Neisseria meningitidis NOT DETECTED NOT DETECTED Final   Pseudomonas aeruginosa NOT DETECTED NOT DETECTED Final   Candida albicans NOT DETECTED NOT DETECTED Final   Candida glabrata NOT DETECTED NOT DETECTED Final   Candida krusei NOT DETECTED NOT DETECTED Final   Candida parapsilosis NOT DETECTED NOT DETECTED Final   Candida tropicalis NOT DETECTED NOT DETECTED Final    Comment: Performed at [facility], [address] [place], [place] [address]  Novel Coronavirus, NAA (hospital order; send-out to ref lab)     Status: None   Collection Time: 04/20/18  4:03 PM  Result Value Ref Range Status   SARS-CoV-2, NAA NOT DETECTED NOT DETECTED Final    Comment: Negative (Not Detected) results do not exclude infection caused by SARS CoV 2 and should not  be used as the sole basis for treatment or other patient management decisions. Optimum specimen types and timing for peak viral levels during infections caused  by SARS CoV 2 have not been determined. Collection of multiple specimens (types and time points) from the same patient may be necessary to detect the virus. Improper specimen collection and handling, sequence variability underlying assay primers and or probes, or the presence of organisms in  quantities less than the limit of detection of the assay may lead to false negative results. Positive and negative predictive values of testing are highly dependent on prevalence. False negative results are more likely when prevalence of disease is high. (NOTE) The expected result is Negative (Not Detected). The SARS CoV 2 test is intended for the presumptive qualitative  detection of nucleic acid from SARS CoV 2 in upper and lower  respir atory specimens. Testing methodology is real time RT PCR. Test results must be correlated with clinical presentation and  evaluated in the context of other laboratory and epidemiologic data.  Test performance can be affected because the epidemiology and  clinical spectrum of infection caused by SARS CoV 2 is not fully  known. For example, the optimum types of specimens to collect and  when during the course of infection these specimens are most likely  to contain detectable viral RNA may not be known. This test has not been Food and Drug Administration (FDA) cleared or  approved and has been authorized by FDA under an Emergency Use  Authorization (EUA). The test is only authorized for the duration of  the declaration that circumstances exist justifying the authorization  of emergency use of in vitro diagnostic tests for detection and or  diagnosis of SARS CoV 2 under Section 564(b)(1) of the Act, 21 U.S.C.  section [phone] 3(b)(1), unless the authorization is terminated or   revoked sooner. [facility] is certified under the  Clinical Laboratory Improvement Amendments of 1988 (CLIA), 42 U.S.C.  section [phone], to perform high complexity tests. Performed at [facility] [id number], [place], [place] [address] Laboratory Director: Loleta Books, MD Performed at [facility], [place] [address], [place], [place] [address]    Coronavirus Source NASOPHARYNGEAL  Final    Comment: Performed at [facility], [address] [place], [place] [address]  MRSA PCR Screening     Status: None   Collection Time: 04/20/18  7:04 PM  Result Value Ref Range Status   MRSA by PCR NEGATIVE NEGATIVE Final    Comment:        The GeneXpert MRSA Assay (FDA approved for NASAL specimens only), is one component of a comprehensive MRSA colonization surveillance program. It is not intended to diagnose MRSA infection nor to guide or monitor treatment for MRSA infections. Performed at [facility], [address] [place], [place] [address]   Culture, blood (Routine X 2) w Reflex to ID Panel     Status: None (Preliminary result)   Collection Time: 04/23/18  2:53 AM  Result Value Ref Range Status   Specimen Description BLOOD RIGHT HAND  Final   Special Requests   Final    BOTTLES DRAWN AEROBIC AND ANAEROBIC Blood Culture results may not be optimal due to an excessive volume of blood received in culture bottles   Culture   Final    NO GROWTH 4 DAYS Performed at [facility], [address] [place], [place] [address]    Report Status PENDING  Incomplete  Culture, blood (Routine X 2) w Reflex to ID Panel     Status: None (Preliminary result)   Collection Time: 04/23/18  2:55 AM  Result Value Ref Range Status   Specimen Description BLOOD RIGHT WRIST  Final   Special Requests   Final    BOTTLES DRAWN AEROBIC AND ANAEROBIC Blood Culture adequate volume   Culture   Final    NO GROWTH 4 DAYS Performed at  [facility], [address] [place], [place] [address]    Report Status PENDING  Incomplete    RADIOLOGY:  Dg Lumbar Spine 2-3 Views  Result Date: 04/26/2018 CLINICAL DATA:  Low back pain EXAM: LUMBAR SPINE - 2-3 VIEW COMPARISON:  04/25/2018 MRI FINDINGS: Changes consistent with L1 compression fracture are again noted. No new compression fractures are seen. Degenerative changes are noted similar to that seen on prior MRI. Diffuse aortic calcifications are seen. No soft tissue abnormality is noted. IMPRESSION: L1 compression fractures stable from the recent MRI. No other focal abnormality is noted. Electronically Signed   By: Inez Catalina M.D.   On: 04/26/2018 10:18   Mr Lumbar Spine W Wo Contrast  Result Date: 04/25/2018 CLINICAL DATA:  Initial evaluation for acute infection, osteomyelitis discitis. EXAM: MRI LUMBAR SPINE WITHOUT AND WITH CONTRAST TECHNIQUE: Multiplanar and multiecho pulse sequences of the lumbar spine were obtained without and with intravenous contrast. CONTRAST:  7 cc of Gadavist. COMPARISON:  Prior CT from 04/20/2018. FINDINGS: Segmentation: Standard. Lowest well-formed disc labeled the L5-S1 level. Alignment: 3 mm anterolisthesis of L4 on L5. Alignment otherwise normal with preservation of the normal lumbar lordosis. No listhesis or subluxation. Vertebrae: Severe compression deformity involving the L1 vertebral body, subacute in appearance with mild residual edema an enhancement. Associated advanced near complete height loss centrally with trace 4 mm bony retropulsion at the posterior/inferior aspect of the L1 vertebral body. Vertebral body heights otherwise maintained with no other evidence for acute or chronic fracture. Abnormal marrow edema and enhancement seen involving the L2  vertebral body, most notable on the left, compatible with acute osteomyelitis. Extension into the left greater than right posterior elements of L2. Additional changes consistent with osteomyelitis  within the left pedicle of L3. Probable associated septic arthritis involving the intervening L2-3 facet. Edema and enhancement within the L1 vertebral body could be related to fracture and/or infection. Probable subtle changes of mild osteomyelitis at the inferior endplate of M08 as well. Fluid density within the T12-L1 and L1-2 interspace is suspicious for associated discitis. Possible subtle changes at L2-3 as well. Associated epidural phlegmon/abscess involving the central and left ventral epidural space extending from L2-3 through L4-5 (series 11, image 9). Additional epidural phlegmon/abscess seen at the right dorsal epidural space at the level of L2-3 (series 14, image 9). Mild epidural phlegmon extends cephalad to the level of approximately T12 (series 11, image 8). Conus medullaris and cauda equina: Conus extends to the L1 level. Thin leptomeningeal enhancement about the conus medullaris, likely infectious in nature. Paraspinal and other soft tissues: Extensive paraspinous edema within the bilateral psoas and left posterior paraspinous musculature. Superimposed multifocal soft tissue abscesses are seen. Largest of these within the left posterior paraspinous musculature measures approximately 2.6 x 3.0 cm (series 15, image 18). Largest abscess involving the left psoas muscle measures approximately 12 mm and is seen at the level of L2-3 (series 15, image 18). Largest involving the right psoas muscle measures 14 x 12 mm and is also seen at the level of L2-3 (series 15, image 18). Involvement of the left crus of diaphragm with associated 13 mm abscess noted (series 15, image 4), similar to prior CT. Multifocal cortical thinning and scarring noted within the right kidney. Scattered superimposed renal cysts noted bilaterally. Visualized visceral structures otherwise unremarkable. Disc levels: T12-L1: Mild disc bulge. Epidural phlegmon/abscess at the posterior epidural space. No significant stenosis. L1-2: L1  compression deformity with up to 4 mm bony retropulsion. Associated diffuse disc bulge with intervertebral disc space narrowing. Mild bilateral facet hypertrophy. Superimposed epidural phlegmon/abscess, most notable at the right posterolateral epidural space. Resultant mild spinal stenosis. Mild bilateral L1 foraminal narrowing. L2-3: Minimal disc bulge. Mild to moderate facet hypertrophy. Epidural phlegmon/abscess, extending into the left L2-3 neural foramen. Central canal remains patent. Mild left foraminal stenosis. L3-4: Diffuse disc bulge with intervertebral disc space narrowing. Moderate facet hypertrophy. Epidural phlegmon/abscess within the central/left ventral epidural space. Resultant moderate canal with severe left lateral recess stenosis. Mild left foraminal narrowing. L4-5: Anterolisthesis. Diffuse disc bulge with disc desiccation. Epidural phlegmon/abscess involves the ventral epidural space, more notable on the left. Moderate facet arthrosis. Resultant moderate canal with left greater than right lateral recess stenosis. Foramina remain patent. L5-S1: Mild diffuse disc bulge. Mild facet hypertrophy. Resultant mild left lateral recess narrowing. Central canal remains patent. No significant foraminal encroachment. IMPRESSION: 1. Findings consistent with acute osteomyelitis involving the L2 and L3 vertebral bodies with probable septic arthritis involving the left L2-3 facet. Mild marrow edema involving the T12 and L1 vertebral bodies concerning for early/mild involvement as well. Suspected early changes of discitis in the intervening T12-L1 through L2-3 interspaces. 2. Associated epidural phlegmon/abscess extending from T12-L1 through L4-5 as above. Changes most pronounced at the L3-4 level were there is resultant moderate canal with severe left lateral recess stenosis. 3. Associated paraspinous phlegmon with soft tissue abscesses involving the bilateral psoas musculature, left crus of diaphragm, and left  posterior paraspinous musculature as above. 4. L1 compression deformity with advanced height loss and up to 4 mm bony retropulsion,  subacute in appearance. Electronically Signed   By: Jeannine Boga M.D.   On: 04/25/2018 18:29   Dg Foot Complete Right  Result Date: 04/25/2018 CLINICAL DATA:  Foot pain.  Neuritis.  Assess for osteomyelitis. EXAM: RIGHT FOOT COMPLETE - 3+ VIEW COMPARISON:  None. FINDINGS: Dressing artifact overlies the great toe. I do not see evidence of fracture or dislocation. No sign of osteomyelitis. Detail the distal tuft is poor because of the overlying artifact. IMPRESSION: Overlying artifact at the great toe. No sign of fracture or osteomyelitis. Electronically Signed   By: Nelson Chimes M.D.   On: 04/25/2018 19:46   [place] Ekg Site Rite  Result Date: 04/27/2018 If Site Rite image not attached, placement could not be confirmed due to current cardiac rhythm.   Follow up with PCP in 1 week.  Management plans discussed with the patient, family and they are in agreement.  CODE STATUS: Full code    Code Status Orders  (From admission, onward)         Start     Ordered   04/20/18 1925  Full code  Continuous     04/20/18 1926        Code Status History    Date Active Date Inactive Code Status Order ID Comments User Context   04/20/2018 1903 04/20/2018 1926 Full Code [id number]  Henreitta Leber, MD Inpatient    Advance Directive Documentation     Most Recent Value  Type of Advance Directive  Living will  Pre-existing out of facility DNR order (yellow form or pink MOST form)  -  "MOST" Form in Place?  -      TOTAL TIME TAKING CARE OF THIS PATIENT ON DAY OF DISCHARGE: more than 39 minutes.   Saundra Shelling M.D on 04/27/2018 at 2:08 PM  Between 7am to 6pm - Pager - [phone]  After 6pm go to [email or web address] - password [email or web address] [facility]  Office  [phone]  CC: Primary care physician; Erline Levine, MD  Note: This dictation was  prepared with Dragon dictation along with smaller phrase technology. Any transcriptional errors that result from this process are unintentional.

## 2018-04-27 NOTE — Progress Notes (Signed)
Patient given discharge instructions. Prescription in packet. Tele monitor off. Patient verbalized understanding with no questions or concerns. Going home via family vehicle. Per patient she only wears oxygen as needed and family will have that for her.

## 2018-04-27 NOTE — Progress Notes (Addendum)
Inpatient Diabetes Program Recommendations  AACE/ADA: New Consensus Statement on Inpatient Glycemic Control  Target Ranges:  Prepandial:   less than 140 mg/dL      Peak postprandial:   less than 180 mg/dL (1-2 hours)      Critically ill patients:  140 - 180 mg/dL   Results for ATTIE, LEVINE (MRN [id number]) as of 04/27/2018 10:46  Ref. Range 04/26/2018 07:30 04/26/2018 08:11 04/26/2018 11:56 04/26/2018 16:22 04/26/2018 21:00 04/27/2018 08:39  Glucose-Capillary Latest Ref Range: 70 - 99 mg/dL 45 (L) 623 (H) 92 762 (H) 184 (H) 91    Review of Glycemic Control  history: DM2 Outpatient Diabetes medications: Lantus 46 units QHS, Metformin 1000 mg BID Current orders for Inpatient glycemic control: Novolog 0-15 units TID with meals, Novolog 0-5 units QHS  Inpatient Diabetes Program Recommendations:  Insulin - Basal: Noted fasting glucose 45 mg/dl on 08/21/13 and 91 mg/dl this morning. Patient last received Lantus 46 units on 04/25/18 and Lantus was discontinued due to hypoglycemia.  Please consider reordering Lantus at lower dose if glucose becomes consistently greater than 180 mg/dl with Novolog correction.  Thanks, Orlando Penner, RN, MSN, CDE Diabetes Coordinator Inpatient Diabetes Program [phone] (Team Pager from 8am to 5pm)

## 2018-04-27 NOTE — Plan of Care (Signed)

## 2018-04-27 NOTE — Progress Notes (Signed)
Ch spoke with pt via telephone to see how she was progressing. Pt shared that she had injured her back awhile ago and thinks it never healed correctly. Pt is here due to being septic since April 1. Pt shared that she seems to hv improved with the help of the antibiotics to treat her infection but still in pain due to neuropathy.Pt shared that she still experiences phantom limb. Ch understood and allowed pt space to lament about her other health issues. Pt is a diabetic and amputee. Pt did participate in PT but would like to be out of the bed and in the recliner if possible to help with her pain. Ch provided words of comfort to pt and hopeful that she would be d/c once the care team saw it was fit.    04/27/18 1000  Clinical Encounter Type  Visited With Patient  Visit Type Psychological support;Spiritual support;Social support  Spiritual Encounters  Spiritual Needs Emotional;Grief support  Stress Factors  Patient Stress Factors Health changes;Exhausted;Major life changes  Family Stress Factors None identified

## 2018-04-27 NOTE — Progress Notes (Signed)
Physical Therapy Treatment Patient Details Name: Alexandra Goodman Alexandra Goodman MRN: [id number] DOB: 02/27/1960 Today's Date: 04/27/2018    History of Present Illness Pt is a 58 y.o. female presenting to hospital with LBP with urinary frequency and admitted with sepsis, PNA, DM, suspected osteomyelitis/discitis (L1-L2), elevated troponin, and AKI.  A-fib with RVR noted during hospitalization (troponin downtrending).  PMH includes L BKA, L1 compression fx, asthma, CHF, COPD, PVD, diabetic neuropathy; uses 3 L chronic home O2; R great toe wound.    PT Comments    Patient eager to participate in therapy session, requesting to transfer to chair. Full session not able to be performed due to patient having to have procedure for Picc line. Patient educated on donning/doffing TLSO brace however requires assistance due to limited visual field. Patient transferred to chair well with Supervision and verbal cueing for setup due to limited visual fields. Patient performed seated exercises with good motivation however was limited due to having to return to bed and doffing TLSO for procedure. Current POC remains appropriate.     Follow Up Recommendations  Home health PT     Equipment Recommendations  Other (comment)(pt has manual and power w/c at home already)    Recommendations for Other Services       Precautions / Restrictions Precautions Precautions: Fall;Back Precaution Comments: TLSO brace Restrictions Weight Bearing Restrictions: Yes Other Position/Activity Restrictions: L BKA (prosthesis not in hospital)     Mobility  Bed Mobility Overal bed mobility: Needs Assistance Bed Mobility: Sit to Supine       Sit to supine: Supervision;HOB elevated   General bed mobility comments: mild increased effort to perform on own; pt requesting HOB elevated prior to laying down d/t back pain  Transfers Overall transfer level: Needs assistance   Transfers: Lateral/Scoot Transfers   Stand pivot transfers:  (discuss sequencing together due to patient's limited visual field)      Lateral/Scoot Transfers: Min guard General transfer comment: L armrest lowered; pt performed lateral scoot from recliner to bed to L; CGA for safety, x2  Ambulation/Gait             General Gait Details: Deferred (pt does not have prosthesis in hospital)   Stairs             Wheelchair Mobility    Modified Rankin (Stroke Patients Only)       Balance Overall balance assessment: Needs assistance Sitting-balance support: No upper extremity supported;Feet supported Sitting balance-Leahy Scale: Good Sitting balance - Comments: stable in seated position. Able to don/doff TLSO with assistance for sequencing. Challenged to don/doff due to visual limitations                                     Cognition Arousal/Alertness: Awake/alert Behavior During Therapy: WFL for tasks assessed/performed Overall Cognitive Status: Within Functional Limits for tasks assessed                                 General Comments: Eager to participate in therapy       Exercises Other Exercises Other Exercises: seated in armchair with legs raised and back reclined: quad set 10x 3 second holds, glute squeezes 10x 3 second holds, anke pump RLE 20x,  Other Exercises: donning/doffing TLSO brace: eduction and sequencing. challenging to patient due to limited visual fields, require verbal set up and discussing  location of parts     General Comments General comments (skin integrity, edema, etc.): protective dressing on pt's R great toe       Pertinent Vitals/Pain Pain Assessment: 0-10 Pain Score: 7  Pain Location: LBP, buttocks Pain Descriptors / Indicators: Aching Pain Intervention(s): Limited activity within patient's tolerance;Repositioned;Monitored during session    Home Living                      Prior Function            PT Goals (current goals can now be found in the  care plan section) Acute Rehab PT Goals Patient Stated Goal: to go home PT Goal Formulation: With patient Time For Goal Achievement: 05/09/18 Potential to Achieve Goals: Good Progress towards PT goals: Progressing toward goals    Frequency    Min 2X/week      PT Plan Current plan remains appropriate    Co-evaluation              AM-PAC PT "6 Clicks" Mobility   Outcome Measure  Help needed turning from your back to your side while in a flat bed without using bedrails?: A Little Help needed moving from lying on your back to sitting on the side of a flat bed without using bedrails?: A Little Help needed moving to and from a bed to a chair (including a wheelchair)?: A Little Help needed standing up from a chair using your arms (Alexandra.g., wheelchair or bedside chair)?: A Lot Help needed to walk in hospital room?: A Lot Help needed climbing 3-5 steps with a railing? : Total 6 Click Score: 14    End of Session Equipment Utilized During Treatment: Gait belt;Oxygen Activity Tolerance: Patient tolerated treatment well Patient left: in bed;with call bell/phone within reach;with bed alarm set;Other (comment)(B LE's elevated via pillows with R heel floating) Nurse Communication: Mobility status;Precautions PT Visit Diagnosis: Other abnormalities of gait and mobility (R26.89);Muscle weakness (generalized) (M62.81);Difficulty in walking, not elsewhere classified (R26.2)     Time: 0258-5277 PT Time Calculation (min) (ACUTE ONLY): 26 min  Charges:  $Therapeutic Exercise: 8-22 mins $Therapeutic Activity: 8-22 mins                     Precious Bard, PT, DPT     Precious Bard 04/27/2018, 12:57 PM

## 2018-04-27 NOTE — Progress Notes (Signed)
PHARMACY CONSULT NOTE FOR:  OUTPATIENT  PARENTERAL ANTIBIOTIC THERAPY (OPAT)  Indication: MSSA bacteremia and lumbar discitis Regimen: Cefazolin 2gm q8h End date: 06/04/2018  IV antibiotic discharge orders are pended. To discharging provider:  please sign these orders via discharge navigator,  Select New Orders & click on the button choice - Manage This Unsigned Work.     Thank you for allowing pharmacy to be a part of this patient's care.  Juliette Alcide, PharmD, BCPS.   Work Cell: [phone] 04/27/2018 10:33 AM

## 2018-04-28 LAB — CULTURE, BLOOD (ROUTINE X 2)
Culture: NO GROWTH
Culture: NO GROWTH
Special Requests: ADEQUATE

## 2018-04-29 ENCOUNTER — Telehealth: Payer: Self-pay | Admitting: Licensed Clinical Social Worker

## 2018-04-29 NOTE — Telephone Encounter (Signed)
[facility] with [facility] called to get verbal orders to go to patient's home 1x every week for 4 weeks and 2 x for 1 week. This is for labs and PICC care. Verbal order given. Discussed that patient needs to be seen in 4 weeks, RN states she could help with virtual visit if we are still dealing with closures and effects from Covid-19. RN states that we could call her phone and coordinate on a day that she is already there. Patient does not have smart phone.

## 2018-05-03 LAB — PROTIME-INR

## 2018-05-05 ENCOUNTER — Encounter: Payer: Self-pay | Admitting: Infectious Diseases

## 2018-05-05 ENCOUNTER — Telehealth: Payer: Self-pay | Admitting: Licensed Clinical Social Worker

## 2018-05-05 NOTE — Telephone Encounter (Signed)
Labs were sent from [facility] intended for patient's primary doctor.

## 2018-05-05 NOTE — Telephone Encounter (Signed)
Received labs in our office from primary office for INR >10. I called patient and she stated that her primary called her and started her on oral vitamin K. Patient states that RN will come out today to recheck her INR.

## 2018-05-05 NOTE — Telephone Encounter (Signed)
Thanks

## 2018-05-08 ENCOUNTER — Encounter: Payer: Self-pay | Admitting: Infectious Diseases

## 2018-05-13 ENCOUNTER — Other Ambulatory Visit: Payer: Self-pay

## 2018-05-13 ENCOUNTER — Ambulatory Visit: Payer: Medicare Other | Attending: Infectious Diseases | Admitting: Infectious Diseases

## 2018-05-13 DIAGNOSIS — M4856XD Collapsed vertebra, not elsewhere classified, lumbar region, subsequent encounter for fracture with routine healing: Secondary | ICD-10-CM

## 2018-05-13 DIAGNOSIS — E1169 Type 2 diabetes mellitus with other specified complication: Secondary | ICD-10-CM

## 2018-05-13 DIAGNOSIS — G061 Intraspinal abscess and granuloma: Secondary | ICD-10-CM | POA: Diagnosis not present

## 2018-05-13 DIAGNOSIS — R7881 Bacteremia: Secondary | ICD-10-CM | POA: Diagnosis not present

## 2018-05-13 DIAGNOSIS — Z8631 Personal history of diabetic foot ulcer: Secondary | ICD-10-CM

## 2018-05-13 DIAGNOSIS — Z89512 Acquired absence of left leg below knee: Secondary | ICD-10-CM

## 2018-05-13 DIAGNOSIS — E1151 Type 2 diabetes mellitus with diabetic peripheral angiopathy without gangrene: Secondary | ICD-10-CM

## 2018-05-13 DIAGNOSIS — M4646 Discitis, unspecified, lumbar region: Secondary | ICD-10-CM

## 2018-05-13 DIAGNOSIS — L97419 Non-pressure chronic ulcer of right heel and midfoot with unspecified severity: Secondary | ICD-10-CM

## 2018-05-13 DIAGNOSIS — Z888 Allergy status to other drugs, medicaments and biological substances status: Secondary | ICD-10-CM

## 2018-05-13 DIAGNOSIS — M4620 Osteomyelitis of vertebra, site unspecified: Secondary | ICD-10-CM

## 2018-05-13 DIAGNOSIS — Z91048 Other nonmedicinal substance allergy status: Secondary | ICD-10-CM

## 2018-05-13 DIAGNOSIS — D649 Anemia, unspecified: Secondary | ICD-10-CM | POA: Insufficient documentation

## 2018-05-13 DIAGNOSIS — J449 Chronic obstructive pulmonary disease, unspecified: Secondary | ICD-10-CM

## 2018-05-13 DIAGNOSIS — Z95828 Presence of other vascular implants and grafts: Secondary | ICD-10-CM

## 2018-05-13 DIAGNOSIS — E1140 Type 2 diabetes mellitus with diabetic neuropathy, unspecified: Secondary | ICD-10-CM

## 2018-05-13 DIAGNOSIS — B9561 Methicillin susceptible Staphylococcus aureus infection as the cause of diseases classified elsewhere: Secondary | ICD-10-CM | POA: Diagnosis not present

## 2018-05-13 DIAGNOSIS — Z720 Tobacco use: Secondary | ICD-10-CM

## 2018-05-13 DIAGNOSIS — Z87440 Personal history of urinary (tract) infections: Secondary | ICD-10-CM

## 2018-05-13 DIAGNOSIS — Z87442 Personal history of urinary calculi: Secondary | ICD-10-CM

## 2018-05-13 DIAGNOSIS — S91109D Unspecified open wound of unspecified toe(s) without damage to nail, subsequent encounter: Secondary | ICD-10-CM

## 2018-05-13 DIAGNOSIS — M4626 Osteomyelitis of vertebra, lumbar region: Secondary | ICD-10-CM

## 2018-05-13 DIAGNOSIS — Z794 Long term (current) use of insulin: Secondary | ICD-10-CM

## 2018-05-13 DIAGNOSIS — Z881 Allergy status to other antibiotic agents status: Secondary | ICD-10-CM

## 2018-05-13 DIAGNOSIS — E11621 Type 2 diabetes mellitus with foot ulcer: Secondary | ICD-10-CM

## 2018-05-13 DIAGNOSIS — I4891 Unspecified atrial fibrillation: Secondary | ICD-10-CM

## 2018-05-13 NOTE — Progress Notes (Signed)
NAME: Alexandra Goodman  DOB: 03/19/1960  MRN: [id number]  Date/Time: 05/13/2018 11:58 AM   Subjective:  Follow up after hospital discharge for MSSA bacteremia and discitis  The purpose of this virtual Telemed visit is to provide medical care while limiting exposure to the novel coronavirus (COVID19) for both patient and office staff.   Consent was obtained for TELEmed-video visit:  Yes.   Answered questions that patient had about telehealth interaction:  Yes.   I discussed the limitations, risks, security and privacy concerns of performing an evaluation and management service by telephone. I also discussed with the patient that there may be a patient responsible charge related to this service. The patient expressed understanding and agreed to proceed.   Patient Location: Home Provider Location:office ? BERLIE PERSKY is a 58 y.o. with a history of COPD, DM, neuropathy, Recent hopsitlaization at [facility] between    Presented with left lower back pain radiating to the abdomen and groin Went to her PCP on 3/27/for   above complaint and a urine was sent for possible UTI as she has had renal stomes and UTI before, and it came back as MSSA and she was asked to go tot he ED  In the ED she was hypoxic , had leucocytosis, hyperglycemia She had Ct abdomen renal protocol which revealed discitis of L1-L2 area and was started on broad spectrum antibiotics Vanco/zosyn and was sent to ICU. covid test was done. The blood culture was  positive for MSSA and saw the patient in the hospital and gave nafcillin for MSSA- she also was found to have a  chronic rt toe wound and was assessed by pur pdiatrist. Pt said she had her wound team at [facility] and would follow up with them as OP. TEE was not done as patient refused  . She had MRI of the lumbar spine on 04/25/18 and that showed extensive findings as listed below. She was seen by neurosurgeon Dr.Cook who asked for a plain Xray and recommended the following No urgent surgical  intervention -TLSO recommended for pain and healing, commend wearing whenever out of bed but may be removed for shower -Upright x-rays of the lumbar spine needed before discharge to allow for comparison to future imaging, and follow up with him in 5-6 weeks  She was discharged on cefazolin 2 grams IV every 8 hrs for 6 weeks to complete on 06/04/18  Today patient says she is doing better, no fever chills, rash, has baseline SOB and is on home oxygen. Family helps with IV antibiotic and she takes it three times a day and has not missed any dose. She takes yogurt She has followed with her PCP for abnormal INR which has been normalized now Still has some back pain . She has L1 compression fracture and now L2-L3 Vertebral osteo She says she has cut down smoking from 2 packs to 5 cigarettes a day She has a spine brace but does not wear it regularly as she finds it uncomfortable   PMH DM Polyneuropathy due to DM PAD Hypothyroidism HTN Hypercholesterolemia DVT Nephrolithiasis Left BKA  Low back pain Compression fracture L1 COPD  PSH C-section Cholecystectomy Left BKA PTCA  SH Smoker Lives with her boyfriend    Family History  Problem Relation Age of Onset  . Heart disease Mother   . Heart disease Father    Allergies  Allergen Reactions  . Skin Protectants, Misc. Rash  . Flagyl [Metronidazole]   . Gabapentin   . Levetiracetam   .  Tape Other (See Comments)    blisters  . Ace Inhibitors Rash  . Ertapenem Rash  . Sertraline Hcl Rash   ? Current Outpatient Medications  Medication Sig Dispense Refill  . albuterol (PROAIR HFA) 108 (90 Base) MCG/ACT inhaler Inhale 2 puffs into the lungs every 4 (four) hours as needed for wheezing.    Marland Kitchen amiodarone (PACERONE) 200 MG tablet Take 1 tablet (200 mg total) by mouth 2 (two) times daily for 30 days. 60 tablet 0  . aspirin EC 81 MG EC tablet Take 1 tablet (81 mg total) by mouth daily for 30 days. 30 tablet 0  . atorvastatin  (LIPITOR) 80 MG tablet Take 80 mg by mouth Nightly.    Marland Kitchen ceFAZolin (ANCEF) IVPB Inject 2 g into the vein every 8 (eight) hours. Indication:  MSSA bacteremia and lumbar discitis Last Day of Therapy:  06/04/2018 Labs - Once weekly (on Mon):  CBC/D and CMP, Labs - Every other week (on Mon):  ESR and CRP 114 Units 0  . clopidogrel (PLAVIX) 75 MG tablet Take 75 mg by mouth daily.    . famotidine (PEPCID) 40 MG tablet Take 40 mg by mouth every evening.    Marland Kitchen FLUoxetine (PROZAC) 20 MG capsule Take 20 mg by mouth daily.    . fluticasone (FLONASE) 50 MCG/ACT nasal spray Place 1 spray into both nostrils daily.    . fluticasone-salmeterol (ADVAIR HFA) 230-21 MCG/ACT inhaler Inhale 2 puffs into the lungs 2 (two) times daily.    . furosemide (LASIX) 40 MG tablet Take 40-80 mg by mouth 2 (two) times daily. 80 mg every morning and 40 mg every evening    . Insulin Glargine (LANTUS SOLOSTAR) 100 UNIT/ML Solostar Pen Inject 46 Units into the skin at bedtime.    Marland Kitchen levothyroxine (SYNTHROID, LEVOTHROID) 88 MCG tablet Take 88 mcg by mouth daily.    Marland Kitchen losartan (COZAAR) 100 MG tablet Take 100 mg by mouth daily.    . metFORMIN (GLUCOPHAGE) 1000 MG tablet Take 1,000 mg by mouth 2 (two) times daily.    . mirabegron ER (MYRBETRIQ) 25 MG TB24 tablet Take 25 mg by mouth daily.    . montelukast (SINGULAIR) 10 MG tablet Take 10 mg by mouth daily.    . naloxone (NARCAN) nasal spray 4 mg/0.1 mL Place 4 mg into the nose once.    . nitroGLYCERIN (NITROSTAT) 0.4 MG SL tablet Place 0.4 mg under the tongue as needed.    Marland Kitchen oxyCODONE-acetaminophen (PERCOCET) 10-325 MG tablet Take 1 tablet by mouth every 6 (six) hours as needed for pain. 20 tablet 0  . predniSONE (DELTASONE) 20 MG tablet Take 40 mg by mouth daily.    Marland Kitchen senna-docusate (SENOKOT-S) 8.6-50 MG tablet Take 2 tablets by mouth daily.    Marland Kitchen tiotropium (SPIRIVA HANDIHALER) 18 MCG inhalation capsule Place 1 capsule into inhaler and inhale daily.    . traZODone (DESYREL) 50 MG  tablet Take 150 mg by mouth at bedtime.    Marland Kitchen warfarin (COUMADIN) 5 MG tablet Take 7.5 mg by mouth daily.     No current facility-administered medications for this visit.      Abtx:  Anti-infectives (From admission, onward)   None      REVIEW OF SYSTEMS:  Const: negative fever, negative chills, negative weight loss Eyes: negative diplopia or visual changes, negative eye pain ENT: negative coryza, negative sore throat Resp: negative cough, hemoptysis, dyspnea Cards: negative for chest pain, palpitations, lower extremity edema GU: negative for frequency, dysuria  and hematuria GI: Negative for abdominal pain, diarrhea, bleeding, constipation Skin: negative for rash and pruritus Heme: negative for easy bruising and gum/nose bleeding MS: negative for myalgias, arthralgias, back pain and muscle weakness Neurolo:negative for headaches, dizziness, vertigo, memory problems  Psych: negative for feelings of anxiety, depression  Endocrine: negative for thyroid, diabetes Allergy/Immunology- negative for any medication or food allergies ? Pertinent Positives include : Objective:  VITALS:  There were no vitals taken for this visit. PHYSICAL EXAM: thru telemedicine Video is limited General: Alert, cooperative, no distress, appears stated age. Has nasal cannula for oxygen- sititng in chair  Head: Normocephalic, without obvious abnormality, atraumatic. Eyes: ENT :grossly normal Oral cavity No thrush  Neck: grossly normal Back:Lungs: Heart: Abdomen: did not examine  Extremities:  Her step daughter removed the dresisng and showed the foot and picture is taken from the screen  rt great to plantar surface ulcer cleaner than before Base has granulation tissue   Dry skin around Left PICC site clean- has biopatch Neurologic: Grossly non-focal Pertinent Labs Lab Results CBC WBC 4/7 was 19.3 05/09/18 it is 11.8  HB 4/7 was 11.5 ( 10.7) 05/09/18 8.9  05/09/18 PLT 352 Cr 0.81 MRI from  04/25/18 acute osteomyelitis involving the L2 and L3 vertebral bodies with probable septic arthritis involving the left L2-3 facet. Mild marrow edema involving the T12 and L1 vertebral bodies concerning for early/mild involvement as well. Suspected early changes of discitis in the intervening T12-L1 through L2-3 interspaces. 2. Associated epidural phlegmon/abscess extending from T12-L1 through L4-5 as above. Changes most pronounced at the L3-4 level were there is resultant moderate canal with severe left lateral recess stenosis. 3. Associated paraspinous phlegmon with soft tissue abscesses involving the bilateral psoas musculature, left crus of diaphragm, and left posterior paraspinous musculature as above. 4. L1 compression deformity with advanced height loss and up to 4 mm bony retropulsion, subacute in appearance.  Microbiology: 4/1 [facility] MSSA 04/23/18 BC NG  IMAGING RESULTS: I have personally reviewed the films ? Impression/Recommendation ?MSSA bacteremia   Lumbar spine extensive infection L2-L3 vertebral osteo, 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 Paraspinal phlemon and psoas muscle involvement Seen by neurosurgeon Dr.Cook  in house and recommended IV antibiotics, followup with him after 5 weeks She is on Cefazolin 2 grams IV q8 and will be getting it for 6 weeks ( may need more) dont have the latest ESR/CRP  Rt toe ulcer - does not look grossly infected- follow with her podiatrist at [facility]  Anemia- further drop in Hb since discharge.  it is 8.9 on 4/20.  She is on coumadin, plavix and aspirin- concern for GI bleed- ( she does not have any dark stool or frank blood) follow with her PCP  PAD   Current smoker  H/o Left BKA  Rt posterior achilles ulcer - healed- followed at [facility]   Will get MRI on the lumbar spine to delineate the infection better as she only had a CT renal protocol  COPDexacerbation - improved  DM- on metformin and insulin Afib RVR  intermittent on amidarone ? discussed the management with the patient and her family- Asked her to follow up with PCP, neurosurgeon and podiatrist  Follow up with me in 3 weeks

## 2018-05-16 ENCOUNTER — Emergency Department: Payer: Medicare Other

## 2018-05-16 ENCOUNTER — Inpatient Hospital Stay
Admission: EM | Admit: 2018-05-16 | Discharge: 2018-06-20 | DRG: 291 | Disposition: E | Payer: Medicare Other | Attending: Family Medicine | Admitting: Family Medicine

## 2018-05-16 ENCOUNTER — Other Ambulatory Visit: Payer: Self-pay

## 2018-05-16 ENCOUNTER — Encounter: Payer: Self-pay | Admitting: Emergency Medicine

## 2018-05-16 DIAGNOSIS — E875 Hyperkalemia: Secondary | ICD-10-CM | POA: Diagnosis not present

## 2018-05-16 DIAGNOSIS — R0603 Acute respiratory distress: Secondary | ICD-10-CM

## 2018-05-16 DIAGNOSIS — J9622 Acute and chronic respiratory failure with hypercapnia: Secondary | ICD-10-CM | POA: Diagnosis present

## 2018-05-16 DIAGNOSIS — J9621 Acute and chronic respiratory failure with hypoxia: Secondary | ICD-10-CM | POA: Diagnosis present

## 2018-05-16 DIAGNOSIS — Z515 Encounter for palliative care: Secondary | ICD-10-CM | POA: Diagnosis not present

## 2018-05-16 DIAGNOSIS — E1169 Type 2 diabetes mellitus with other specified complication: Secondary | ICD-10-CM | POA: Diagnosis present

## 2018-05-16 DIAGNOSIS — J440 Chronic obstructive pulmonary disease with acute lower respiratory infection: Secondary | ICD-10-CM | POA: Diagnosis present

## 2018-05-16 DIAGNOSIS — Z8619 Personal history of other infectious and parasitic diseases: Secondary | ICD-10-CM

## 2018-05-16 DIAGNOSIS — M310 Hypersensitivity angiitis: Secondary | ICD-10-CM | POA: Diagnosis not present

## 2018-05-16 DIAGNOSIS — N179 Acute kidney failure, unspecified: Secondary | ICD-10-CM | POA: Diagnosis present

## 2018-05-16 DIAGNOSIS — R58 Hemorrhage, not elsewhere classified: Secondary | ICD-10-CM | POA: Diagnosis not present

## 2018-05-16 DIAGNOSIS — I130 Hypertensive heart and chronic kidney disease with heart failure and stage 1 through stage 4 chronic kidney disease, or unspecified chronic kidney disease: Secondary | ICD-10-CM | POA: Diagnosis present

## 2018-05-16 DIAGNOSIS — Z87440 Personal history of urinary (tract) infections: Secondary | ICD-10-CM

## 2018-05-16 DIAGNOSIS — L89302 Pressure ulcer of unspecified buttock, stage 2: Secondary | ICD-10-CM | POA: Diagnosis present

## 2018-05-16 DIAGNOSIS — R7881 Bacteremia: Secondary | ICD-10-CM | POA: Diagnosis present

## 2018-05-16 DIAGNOSIS — Z8249 Family history of ischemic heart disease and other diseases of the circulatory system: Secondary | ICD-10-CM

## 2018-05-16 DIAGNOSIS — E1122 Type 2 diabetes mellitus with diabetic chronic kidney disease: Secondary | ICD-10-CM | POA: Diagnosis present

## 2018-05-16 DIAGNOSIS — I248 Other forms of acute ischemic heart disease: Secondary | ICD-10-CM | POA: Diagnosis present

## 2018-05-16 DIAGNOSIS — J189 Pneumonia, unspecified organism: Secondary | ICD-10-CM | POA: Diagnosis present

## 2018-05-16 DIAGNOSIS — J9811 Atelectasis: Secondary | ICD-10-CM | POA: Diagnosis present

## 2018-05-16 DIAGNOSIS — Z7952 Long term (current) use of systemic steroids: Secondary | ICD-10-CM

## 2018-05-16 DIAGNOSIS — J9600 Acute respiratory failure, unspecified whether with hypoxia or hypercapnia: Secondary | ICD-10-CM

## 2018-05-16 DIAGNOSIS — J9601 Acute respiratory failure with hypoxia: Secondary | ICD-10-CM

## 2018-05-16 DIAGNOSIS — Z794 Long term (current) use of insulin: Secondary | ICD-10-CM

## 2018-05-16 DIAGNOSIS — Z7982 Long term (current) use of aspirin: Secondary | ICD-10-CM

## 2018-05-16 DIAGNOSIS — J9602 Acute respiratory failure with hypercapnia: Principal | ICD-10-CM

## 2018-05-16 DIAGNOSIS — L270 Generalized skin eruption due to drugs and medicaments taken internally: Secondary | ICD-10-CM | POA: Diagnosis not present

## 2018-05-16 DIAGNOSIS — F329 Major depressive disorder, single episode, unspecified: Secondary | ICD-10-CM | POA: Diagnosis present

## 2018-05-16 DIAGNOSIS — Z7901 Long term (current) use of anticoagulants: Secondary | ICD-10-CM

## 2018-05-16 DIAGNOSIS — Y95 Nosocomial condition: Secondary | ICD-10-CM | POA: Diagnosis present

## 2018-05-16 DIAGNOSIS — F17210 Nicotine dependence, cigarettes, uncomplicated: Secondary | ICD-10-CM | POA: Diagnosis present

## 2018-05-16 DIAGNOSIS — F419 Anxiety disorder, unspecified: Secondary | ICD-10-CM | POA: Diagnosis present

## 2018-05-16 DIAGNOSIS — Z20828 Contact with and (suspected) exposure to other viral communicable diseases: Secondary | ICD-10-CM | POA: Diagnosis present

## 2018-05-16 DIAGNOSIS — M4646 Discitis, unspecified, lumbar region: Secondary | ICD-10-CM | POA: Diagnosis present

## 2018-05-16 DIAGNOSIS — L519 Erythema multiforme, unspecified: Secondary | ICD-10-CM | POA: Diagnosis not present

## 2018-05-16 DIAGNOSIS — N183 Chronic kidney disease, stage 3 (moderate): Secondary | ICD-10-CM | POA: Diagnosis present

## 2018-05-16 DIAGNOSIS — Z888 Allergy status to other drugs, medicaments and biological substances status: Secondary | ICD-10-CM

## 2018-05-16 DIAGNOSIS — Z8673 Personal history of transient ischemic attack (TIA), and cerebral infarction without residual deficits: Secondary | ICD-10-CM

## 2018-05-16 DIAGNOSIS — G8929 Other chronic pain: Secondary | ICD-10-CM | POA: Diagnosis present

## 2018-05-16 DIAGNOSIS — I2489 Other forms of acute ischemic heart disease: Secondary | ICD-10-CM

## 2018-05-16 DIAGNOSIS — T361X5A Adverse effect of cephalosporins and other beta-lactam antibiotics, initial encounter: Secondary | ICD-10-CM | POA: Diagnosis not present

## 2018-05-16 DIAGNOSIS — R591 Generalized enlarged lymph nodes: Secondary | ICD-10-CM | POA: Diagnosis not present

## 2018-05-16 DIAGNOSIS — E871 Hypo-osmolality and hyponatremia: Secondary | ICD-10-CM | POA: Diagnosis not present

## 2018-05-16 DIAGNOSIS — Z7902 Long term (current) use of antithrombotics/antiplatelets: Secondary | ICD-10-CM

## 2018-05-16 DIAGNOSIS — D649 Anemia, unspecified: Secondary | ICD-10-CM | POA: Diagnosis not present

## 2018-05-16 DIAGNOSIS — Z66 Do not resuscitate: Secondary | ICD-10-CM | POA: Diagnosis not present

## 2018-05-16 DIAGNOSIS — Z86718 Personal history of other venous thrombosis and embolism: Secondary | ICD-10-CM

## 2018-05-16 DIAGNOSIS — I5033 Acute on chronic diastolic (congestive) heart failure: Secondary | ICD-10-CM | POA: Diagnosis present

## 2018-05-16 DIAGNOSIS — B370 Candidal stomatitis: Secondary | ICD-10-CM | POA: Diagnosis not present

## 2018-05-16 DIAGNOSIS — K6812 Psoas muscle abscess: Secondary | ICD-10-CM | POA: Diagnosis not present

## 2018-05-16 DIAGNOSIS — Z91048 Other nonmedicinal substance allergy status: Secondary | ICD-10-CM

## 2018-05-16 DIAGNOSIS — E039 Hypothyroidism, unspecified: Secondary | ICD-10-CM | POA: Diagnosis present

## 2018-05-16 DIAGNOSIS — Z6828 Body mass index (BMI) 28.0-28.9, adult: Secondary | ICD-10-CM

## 2018-05-16 DIAGNOSIS — L97519 Non-pressure chronic ulcer of other part of right foot with unspecified severity: Secondary | ICD-10-CM | POA: Diagnosis not present

## 2018-05-16 DIAGNOSIS — Z89512 Acquired absence of left leg below knee: Secondary | ICD-10-CM

## 2018-05-16 DIAGNOSIS — E785 Hyperlipidemia, unspecified: Secondary | ICD-10-CM | POA: Diagnosis present

## 2018-05-16 DIAGNOSIS — M4626 Osteomyelitis of vertebra, lumbar region: Secondary | ICD-10-CM | POA: Diagnosis present

## 2018-05-16 DIAGNOSIS — E1151 Type 2 diabetes mellitus with diabetic peripheral angiopathy without gangrene: Secondary | ICD-10-CM | POA: Diagnosis not present

## 2018-05-16 DIAGNOSIS — J441 Chronic obstructive pulmonary disease with (acute) exacerbation: Secondary | ICD-10-CM | POA: Diagnosis present

## 2018-05-16 DIAGNOSIS — Y92230 Patient room in hospital as the place of occurrence of the external cause: Secondary | ICD-10-CM | POA: Diagnosis not present

## 2018-05-16 DIAGNOSIS — M009 Pyogenic arthritis, unspecified: Secondary | ICD-10-CM | POA: Diagnosis present

## 2018-05-16 DIAGNOSIS — G061 Intraspinal abscess and granuloma: Secondary | ICD-10-CM | POA: Diagnosis not present

## 2018-05-16 DIAGNOSIS — Z79890 Hormone replacement therapy (postmenopausal): Secondary | ICD-10-CM

## 2018-05-16 DIAGNOSIS — E440 Moderate protein-calorie malnutrition: Secondary | ICD-10-CM | POA: Diagnosis present

## 2018-05-16 DIAGNOSIS — M0008 Staphylococcal arthritis, vertebrae: Secondary | ICD-10-CM | POA: Diagnosis not present

## 2018-05-16 DIAGNOSIS — K5900 Constipation, unspecified: Secondary | ICD-10-CM | POA: Diagnosis not present

## 2018-05-16 DIAGNOSIS — E11621 Type 2 diabetes mellitus with foot ulcer: Secondary | ICD-10-CM | POA: Diagnosis not present

## 2018-05-16 DIAGNOSIS — D631 Anemia in chronic kidney disease: Secondary | ICD-10-CM | POA: Diagnosis present

## 2018-05-16 DIAGNOSIS — R7989 Other specified abnormal findings of blood chemistry: Secondary | ICD-10-CM

## 2018-05-16 DIAGNOSIS — Z7951 Long term (current) use of inhaled steroids: Secondary | ICD-10-CM

## 2018-05-16 DIAGNOSIS — B9561 Methicillin susceptible Staphylococcus aureus infection as the cause of diseases classified elsewhere: Secondary | ICD-10-CM | POA: Diagnosis present

## 2018-05-16 DIAGNOSIS — Z79899 Other long term (current) drug therapy: Secondary | ICD-10-CM

## 2018-05-16 DIAGNOSIS — R778 Other specified abnormalities of plasma proteins: Secondary | ICD-10-CM

## 2018-05-16 DIAGNOSIS — M4656 Other infective spondylopathies, lumbar region: Secondary | ICD-10-CM | POA: Diagnosis not present

## 2018-05-16 DIAGNOSIS — L959 Vasculitis limited to the skin, unspecified: Secondary | ICD-10-CM | POA: Diagnosis present

## 2018-05-16 DIAGNOSIS — Z8701 Personal history of pneumonia (recurrent): Secondary | ICD-10-CM

## 2018-05-16 DIAGNOSIS — Z9981 Dependence on supplemental oxygen: Secondary | ICD-10-CM

## 2018-05-16 DIAGNOSIS — I480 Paroxysmal atrial fibrillation: Secondary | ICD-10-CM | POA: Diagnosis present

## 2018-05-16 LAB — FIBRIN DERIVATIVES D-DIMER (ARMC ONLY): Fibrin derivatives D-dimer (ARMC): 2149.32 ng/mL (FEU) — ABNORMAL HIGH (ref 0.00–499.00)

## 2018-05-16 LAB — URINALYSIS, ROUTINE W REFLEX MICROSCOPIC
Bacteria, UA: NONE SEEN
Bilirubin Urine: NEGATIVE
Glucose, UA: NEGATIVE mg/dL
Hgb urine dipstick: NEGATIVE
Ketones, ur: NEGATIVE mg/dL
Leukocytes,Ua: NEGATIVE
Nitrite: NEGATIVE
Protein, ur: 30 mg/dL — AB
Specific Gravity, Urine: 1.019 (ref 1.005–1.030)
pH: 5 (ref 5.0–8.0)

## 2018-05-16 LAB — GLUCOSE, CAPILLARY
Glucose-Capillary: 125 mg/dL — ABNORMAL HIGH (ref 70–99)
Glucose-Capillary: 229 mg/dL — ABNORMAL HIGH (ref 70–99)
Glucose-Capillary: 255 mg/dL — ABNORMAL HIGH (ref 70–99)
Glucose-Capillary: 99 mg/dL (ref 70–99)

## 2018-05-16 LAB — COMPREHENSIVE METABOLIC PANEL
ALT: <5 U/L (ref 0–44)
AST: 16 U/L (ref 15–41)
Albumin: 2.4 g/dL — ABNORMAL LOW (ref 3.5–5.0)
Alkaline Phosphatase: 138 U/L — ABNORMAL HIGH (ref 38–126)
Anion gap: 13 (ref 5–15)
BUN: 31 mg/dL — ABNORMAL HIGH (ref 6–20)
CO2: 26 mmol/L (ref 22–32)
Calcium: 8.6 mg/dL — ABNORMAL LOW (ref 8.9–10.3)
Chloride: 97 mmol/L — ABNORMAL LOW (ref 98–111)
Creatinine, Ser: 1.02 mg/dL — ABNORMAL HIGH (ref 0.44–1.00)
GFR calc Af Amer: >60 mL/min (ref >60)
GFR calc non Af Amer: >60 mL/min (ref >60)
Glucose, Bld: 190 mg/dL — ABNORMAL HIGH (ref 70–99)
Potassium: 4.5 mmol/L (ref 3.5–5.1)
Sodium: 136 mmol/L (ref 135–145)
Total Bilirubin: 0.5 mg/dL (ref 0.3–1.2)
Total Protein: 6.5 g/dL (ref 6.5–8.1)

## 2018-05-16 LAB — BLOOD GAS, ARTERIAL
Acid-Base Excess: 2.8 mmol/L — ABNORMAL HIGH (ref 0.0–2.0)
Bicarbonate: 28.5 mmol/L — ABNORMAL HIGH (ref 20.0–28.0)
FIO2: 0.36
O2 Saturation: 93.1 %
Patient temperature: 37
pCO2 arterial: 47 mmHg (ref 32.0–48.0)
pH, Arterial: 7.39 (ref 7.350–7.450)
pO2, Arterial: 68 mmHg — ABNORMAL LOW (ref 83.0–108.0)

## 2018-05-16 LAB — SARS CORONAVIRUS 2 BY RT PCR (HOSPITAL ORDER, PERFORMED IN ~~LOC~~ HOSPITAL LAB): SARS Coronavirus 2: NEGATIVE

## 2018-05-16 LAB — CBC WITH DIFFERENTIAL/PLATELET
Abs Immature Granulocytes: 0.09 10*3/uL — ABNORMAL HIGH (ref 0.00–0.07)
Basophils Absolute: 0 10*3/uL (ref 0.0–0.1)
Basophils Relative: 0 %
Eosinophils Absolute: 0.2 10*3/uL (ref 0.0–0.5)
Eosinophils Relative: 1 %
HCT: 33.1 % — ABNORMAL LOW (ref 36.0–46.0)
Hemoglobin: 9.6 g/dL — ABNORMAL LOW (ref 12.0–15.0)
Immature Granulocytes: 1 %
Lymphocytes Relative: 8 %
Lymphs Abs: 1.2 10*3/uL (ref 0.7–4.0)
MCH: 26 pg (ref 26.0–34.0)
MCHC: 29 g/dL — ABNORMAL LOW (ref 30.0–36.0)
MCV: 89.7 fL (ref 80.0–100.0)
Monocytes Absolute: 0.5 10*3/uL (ref 0.1–1.0)
Monocytes Relative: 4 %
Neutro Abs: 13.1 10*3/uL — ABNORMAL HIGH (ref 1.7–7.7)
Neutrophils Relative %: 86 %
Platelets: 534 10*3/uL — ABNORMAL HIGH (ref 150–400)
RBC: 3.69 MIL/uL — ABNORMAL LOW (ref 3.87–5.11)
RDW: 24 % — ABNORMAL HIGH (ref 11.5–15.5)
Smear Review: NORMAL
WBC: 14.7 10*3/uL — ABNORMAL HIGH (ref 4.0–10.5)
nRBC: 0 % (ref 0.0–0.2)

## 2018-05-16 LAB — TROPONIN I
Troponin I: 0.31 ng/mL (ref <0.03)
Troponin I: 0.39 ng/mL (ref <0.03)
Troponin I: 0.66 ng/mL (ref <0.03)

## 2018-05-16 LAB — BLOOD GAS, VENOUS
Acid-Base Excess: 2.8 mmol/L — ABNORMAL HIGH (ref 0.0–2.0)
Bicarbonate: 30.4 mmol/L — ABNORMAL HIGH (ref 20.0–28.0)
FIO2: 1
O2 Saturation: 95.8 %
Patient temperature: 37
pCO2, Ven: 59 mmHg (ref 44.0–60.0)
pH, Ven: 7.32 (ref 7.250–7.430)
pO2, Ven: 87 mmHg — ABNORMAL HIGH (ref 32.0–45.0)

## 2018-05-16 LAB — FIBRINOGEN: Fibrinogen: 672 mg/dL — ABNORMAL HIGH (ref 210–475)

## 2018-05-16 LAB — BRAIN NATRIURETIC PEPTIDE: B Natriuretic Peptide: 852 pg/mL — ABNORMAL HIGH (ref 0.0–100.0)

## 2018-05-16 LAB — EXPECTORATED SPUTUM ASSESSMENT W GRAM STAIN, RFLX TO RESP C

## 2018-05-16 LAB — LACTATE DEHYDROGENASE: LDH: 179 U/L (ref 98–192)

## 2018-05-16 LAB — LACTIC ACID, PLASMA
Lactic Acid, Venous: 2.3 mmol/L (ref 0.5–1.9)
Lactic Acid, Venous: 2.1 mmol/L (ref 0.5–1.9)

## 2018-05-16 LAB — PROTIME-INR
INR: 1.9 — ABNORMAL HIGH (ref 0.8–1.2)
Prothrombin Time: 21.7 seconds — ABNORMAL HIGH (ref 11.4–15.2)

## 2018-05-16 LAB — MAGNESIUM: Magnesium: 1.6 mg/dL — ABNORMAL LOW (ref 1.7–2.4)

## 2018-05-16 LAB — TRIGLYCERIDES: Triglycerides: 99 mg/dL (ref <150)

## 2018-05-16 LAB — PROCALCITONIN: Procalcitonin: 0.57 ng/mL

## 2018-05-16 LAB — C-REACTIVE PROTEIN: CRP: 5 mg/dL — ABNORMAL HIGH (ref <1.0)

## 2018-05-16 LAB — MRSA PCR SCREENING: MRSA by PCR: NEGATIVE

## 2018-05-16 LAB — INFLUENZA PANEL BY PCR (TYPE A & B)
Influenza A By PCR: NEGATIVE
Influenza B By PCR: NEGATIVE

## 2018-05-16 LAB — FERRITIN: Ferritin: 44 ng/mL (ref 11–307)

## 2018-05-16 MED ORDER — ASPIRIN EC 81 MG PO TBEC
81.0000 mg | DELAYED_RELEASE_TABLET | Freq: Every day | ORAL | Status: DC
  Administered 2018-05-18 – 2018-05-26 (×8): 81 mg via ORAL
  Filled 2018-05-16 (×10): qty 1

## 2018-05-16 MED ORDER — IPRATROPIUM-ALBUTEROL 0.5-2.5 (3) MG/3ML IN SOLN
3.0000 mL | Freq: Once | RESPIRATORY_TRACT | Status: AC
  Administered 2018-05-16: 3 mL via RESPIRATORY_TRACT
  Filled 2018-05-16: qty 3

## 2018-05-16 MED ORDER — OXYCODONE-ACETAMINOPHEN 5-325 MG PO TABS
1.0000 | ORAL_TABLET | Freq: Three times a day (TID) | ORAL | Status: DC | PRN
  Administered 2018-05-16 – 2018-05-27 (×14): 1 via ORAL
  Filled 2018-05-16 (×16): qty 1

## 2018-05-16 MED ORDER — NOREPINEPHRINE 4 MG/250ML-% IV SOLN
0.0000 ug/min | INTRAVENOUS | Status: DC
  Administered 2018-05-16: 23:00:00 10 ug/min via INTRAVENOUS
  Filled 2018-05-16: qty 250

## 2018-05-16 MED ORDER — MAGNESIUM SULFATE 2 GM/50ML IV SOLN: 2.0000 g | Freq: Once | INTRAVENOUS | Status: DC

## 2018-05-16 MED ORDER — SODIUM CHLORIDE 0.9 % IV BOLUS
1000.0000 mL | Freq: Once | INTRAVENOUS | Status: AC
  Administered 2018-05-16: 05:00:00 1000 mL via INTRAVENOUS

## 2018-05-16 MED ORDER — FLUOXETINE HCL 20 MG PO CAPS
20.0000 mg | ORAL_CAPSULE | Freq: Every day | ORAL | Status: DC
  Administered 2018-05-18 – 2018-05-27 (×9): 20 mg via ORAL
  Filled 2018-05-16 (×12): qty 1

## 2018-05-16 MED ORDER — LEVOFLOXACIN IN D5W 750 MG/150ML IV SOLN
750.0000 mg | INTRAVENOUS | Status: DC
  Administered 2018-05-17: 15:00:00 750 mg via INTRAVENOUS
  Filled 2018-05-16 (×2): qty 150

## 2018-05-16 MED ORDER — VANCOMYCIN HCL IN DEXTROSE 1-5 GM/200ML-% IV SOLN: 1000.0000 mg | Freq: Once | INTRAVENOUS | Status: DC

## 2018-05-16 MED ORDER — VANCOMYCIN HCL IN DEXTROSE 1-5 GM/200ML-% IV SOLN
1000.0000 mg | Freq: Once | INTRAVENOUS | Status: DC
  Administered 2018-05-16: 1000 mg via INTRAVENOUS
  Filled 2018-05-16: qty 200

## 2018-05-16 MED ORDER — INSULIN ASPART 100 UNIT/ML ~~LOC~~ SOLN
0.0000 [IU] | Freq: Three times a day (TID) | SUBCUTANEOUS | Status: DC
  Administered 2018-05-16: 17:00:00 5 [IU] via SUBCUTANEOUS
  Administered 2018-05-17: 3 [IU] via SUBCUTANEOUS
  Administered 2018-05-18: 12:00:00 8 [IU] via SUBCUTANEOUS
  Administered 2018-05-18: 3 [IU] via SUBCUTANEOUS
  Administered 2018-05-19: 12:00:00 5 [IU] via SUBCUTANEOUS
  Administered 2018-05-19: 3 [IU] via SUBCUTANEOUS
  Administered 2018-05-19: 5 [IU] via SUBCUTANEOUS
  Administered 2018-05-20: 8 [IU] via SUBCUTANEOUS
  Administered 2018-05-20: 5 [IU] via SUBCUTANEOUS
  Administered 2018-05-20 – 2018-05-21 (×2): 8 [IU] via SUBCUTANEOUS
  Administered 2018-05-21: 16:00:00 5 [IU] via SUBCUTANEOUS
  Administered 2018-05-21: 08:00:00 8 [IU] via SUBCUTANEOUS
  Administered 2018-05-22: 3 [IU] via SUBCUTANEOUS
  Administered 2018-05-22: 2 [IU] via SUBCUTANEOUS
  Administered 2018-05-22 – 2018-05-23 (×2): 3 [IU] via SUBCUTANEOUS
  Administered 2018-05-23 (×2): 8 [IU] via SUBCUTANEOUS
  Administered 2018-05-24: 3 [IU] via SUBCUTANEOUS
  Administered 2018-05-24 (×2): 5 [IU] via SUBCUTANEOUS
  Administered 2018-05-25: 08:00:00 2 [IU] via SUBCUTANEOUS
  Administered 2018-05-25: 18:00:00 11 [IU] via SUBCUTANEOUS
  Administered 2018-05-25 – 2018-05-26 (×3): 2 [IU] via SUBCUTANEOUS
  Administered 2018-05-26: 08:00:00 3 [IU] via SUBCUTANEOUS
  Administered 2018-05-27: 08:00:00 2 [IU] via SUBCUTANEOUS
  Filled 2018-05-16 (×28): qty 1

## 2018-05-16 MED ORDER — IPRATROPIUM-ALBUTEROL 0.5-2.5 (3) MG/3ML IN SOLN
3.0000 mL | Freq: Four times a day (QID) | RESPIRATORY_TRACT | Status: DC
  Administered 2018-05-16 – 2018-05-27 (×46): 3 mL via RESPIRATORY_TRACT
  Filled 2018-05-16 (×46): qty 3

## 2018-05-16 MED ORDER — CHLORHEXIDINE GLUCONATE 0.12 % MT SOLN
15.0000 mL | Freq: Two times a day (BID) | OROMUCOSAL | Status: DC
  Administered 2018-05-16 – 2018-05-27 (×17): 15 mL via OROMUCOSAL
  Filled 2018-05-16 (×18): qty 15

## 2018-05-16 MED ORDER — WARFARIN SODIUM 7.5 MG PO TABS
8.5000 mg | ORAL_TABLET | Freq: Once | ORAL | Status: AC
  Administered 2018-05-16: 8.5 mg via ORAL
  Filled 2018-05-16: qty 1

## 2018-05-16 MED ORDER — TIOTROPIUM BROMIDE MONOHYDRATE 18 MCG IN CAPS
1.0000 | ORAL_CAPSULE | Freq: Every day | RESPIRATORY_TRACT | Status: DC
  Administered 2018-05-18: 18 ug via RESPIRATORY_TRACT
  Filled 2018-05-16: qty 5

## 2018-05-16 MED ORDER — ORAL CARE MOUTH RINSE
15.0000 mL | Freq: Two times a day (BID) | OROMUCOSAL | Status: DC
  Administered 2018-05-16 – 2018-05-26 (×9): 15 mL via OROMUCOSAL

## 2018-05-16 MED ORDER — ENOXAPARIN SODIUM 40 MG/0.4ML ~~LOC~~ SOLN: 40.0000 mg | SUBCUTANEOUS | Status: DC

## 2018-05-16 MED ORDER — FLUTICASONE PROPIONATE 50 MCG/ACT NA SUSP
1.0000 | Freq: Every day | NASAL | Status: DC
  Administered 2018-05-17 – 2018-05-27 (×9): 1 via NASAL
  Filled 2018-05-16: qty 16

## 2018-05-16 MED ORDER — METHYLPREDNISOLONE SODIUM SUCC 125 MG IJ SOLR
INTRAMUSCULAR | Status: AC
  Filled 2018-05-16: qty 2

## 2018-05-16 MED ORDER — SODIUM CHLORIDE 0.9 % IV BOLUS
1000.0000 mL | Freq: Once | INTRAVENOUS | Status: AC
  Administered 2018-05-16: 1000 mL via INTRAVENOUS

## 2018-05-16 MED ORDER — METHYLPREDNISOLONE SODIUM SUCC 125 MG IJ SOLR
125.0000 mg | Freq: Once | INTRAMUSCULAR | Status: AC
  Administered 2018-05-16: 05:00:00 125 mg via INTRAVENOUS

## 2018-05-16 MED ORDER — TRAZODONE HCL 100 MG PO TABS
150.0000 mg | ORAL_TABLET | Freq: Every day | ORAL | Status: DC
  Administered 2018-05-16 – 2018-05-26 (×10): 150 mg via ORAL
  Filled 2018-05-16 (×11): qty 2

## 2018-05-16 MED ORDER — IPRATROPIUM-ALBUTEROL 0.5-2.5 (3) MG/3ML IN SOLN
3.0000 mL | Freq: Once | RESPIRATORY_TRACT | Status: AC
  Administered 2018-05-16: 05:00:00 3 mL via RESPIRATORY_TRACT
  Filled 2018-05-16: qty 3

## 2018-05-16 MED ORDER — SODIUM CHLORIDE 0.9 % IV SOLN
500.0000 mg | Freq: Every day | INTRAVENOUS | Status: DC
  Administered 2018-05-16: 15:00:00 500 mg via INTRAVENOUS
  Filled 2018-05-16 (×2): qty 500

## 2018-05-16 MED ORDER — ALBUTEROL SULFATE HFA 108 (90 BASE) MCG/ACT IN AERS: 2.0000 | INHALATION_SPRAY | RESPIRATORY_TRACT | Status: DC | PRN

## 2018-05-16 MED ORDER — IPRATROPIUM-ALBUTEROL 0.5-2.5 (3) MG/3ML IN SOLN
RESPIRATORY_TRACT | Status: AC
  Administered 2018-05-16: 05:00:00 3 mL
  Filled 2018-05-16: qty 9

## 2018-05-16 MED ORDER — MOMETASONE FURO-FORMOTEROL FUM 200-5 MCG/ACT IN AERO
2.0000 | INHALATION_SPRAY | Freq: Two times a day (BID) | RESPIRATORY_TRACT | Status: DC
  Administered 2018-05-16 – 2018-05-23 (×14): 2 via RESPIRATORY_TRACT
  Filled 2018-05-16: qty 8.8

## 2018-05-16 MED ORDER — MIRABEGRON ER 25 MG PO TB24
25.0000 mg | ORAL_TABLET | Freq: Every day | ORAL | Status: DC
  Administered 2018-05-18 – 2018-05-26 (×8): 25 mg via ORAL
  Filled 2018-05-16 (×12): qty 1

## 2018-05-16 MED ORDER — VANCOMYCIN HCL 10 G IV SOLR
1250.0000 mg | INTRAVENOUS | Status: DC
  Administered 2018-05-17: 1250 mg via INTRAVENOUS
  Filled 2018-05-16 (×2): qty 1250

## 2018-05-16 MED ORDER — SODIUM CHLORIDE 0.9 % IV SOLN
2.0000 g | Freq: Once | INTRAVENOUS | Status: AC
  Administered 2018-05-16: 06:00:00 2 g via INTRAVENOUS
  Filled 2018-05-16: qty 2

## 2018-05-16 MED ORDER — SODIUM CHLORIDE 0.9 % IV SOLN
1.0000 g | INTRAVENOUS | Status: DC
  Administered 2018-05-16: 1 g via INTRAVENOUS
  Filled 2018-05-16: qty 1

## 2018-05-16 MED ORDER — WARFARIN SODIUM 7.5 MG PO TABS
7.5000 mg | ORAL_TABLET | Freq: Every day | ORAL | Status: DC
  Filled 2018-05-16: qty 1

## 2018-05-16 MED ORDER — METFORMIN HCL 500 MG PO TABS
1000.0000 mg | ORAL_TABLET | Freq: Two times a day (BID) | ORAL | Status: DC
  Filled 2018-05-16: qty 2

## 2018-05-16 MED ORDER — WARFARIN - PHYSICIAN DOSING INPATIENT: Freq: Every day | Status: DC

## 2018-05-16 MED ORDER — ALBUTEROL SULFATE (2.5 MG/3ML) 0.083% IN NEBU
2.5000 mg | INHALATION_SOLUTION | RESPIRATORY_TRACT | Status: DC | PRN
  Administered 2018-05-23 – 2018-05-26 (×4): 2.5 mg via RESPIRATORY_TRACT
  Filled 2018-05-16 (×4): qty 3

## 2018-05-16 MED ORDER — OXYCODONE HCL 5 MG PO TABS
5.0000 mg | ORAL_TABLET | Freq: Three times a day (TID) | ORAL | Status: DC | PRN
  Administered 2018-05-17 – 2018-05-27 (×13): 5 mg via ORAL
  Filled 2018-05-16 (×16): qty 1

## 2018-05-16 MED ORDER — GUAIFENESIN ER 600 MG PO TB12
600.0000 mg | ORAL_TABLET | Freq: Two times a day (BID) | ORAL | Status: DC
  Administered 2018-05-16 – 2018-05-26 (×16): 600 mg via ORAL
  Filled 2018-05-16 (×18): qty 1

## 2018-05-16 MED ORDER — INSULIN ASPART 100 UNIT/ML ~~LOC~~ SOLN
0.0000 [IU] | Freq: Every day | SUBCUTANEOUS | Status: DC
  Administered 2018-05-18 – 2018-05-20 (×2): 3 [IU] via SUBCUTANEOUS
  Administered 2018-05-21: 22:00:00 2 [IU] via SUBCUTANEOUS
  Administered 2018-05-23 – 2018-05-26 (×3): 5 [IU] via SUBCUTANEOUS
  Filled 2018-05-16 (×6): qty 1

## 2018-05-16 MED ORDER — AMIODARONE HCL 200 MG PO TABS
200.0000 mg | ORAL_TABLET | Freq: Two times a day (BID) | ORAL | Status: DC
  Administered 2018-05-16 – 2018-05-26 (×19): 200 mg via ORAL
  Filled 2018-05-16 (×21): qty 1

## 2018-05-16 MED ORDER — FAMOTIDINE 20 MG PO TABS
40.0000 mg | ORAL_TABLET | Freq: Every evening | ORAL | Status: DC
  Administered 2018-05-18 – 2018-05-23 (×5): 40 mg via ORAL
  Filled 2018-05-16 (×7): qty 2

## 2018-05-16 MED ORDER — MAGNESIUM SULFATE 2 GM/50ML IV SOLN
2.0000 g | Freq: Once | INTRAVENOUS | Status: AC
  Administered 2018-05-16: 2 g via INTRAVENOUS
  Filled 2018-05-16: qty 50

## 2018-05-16 MED ORDER — ATORVASTATIN CALCIUM 80 MG PO TABS
80.0000 mg | ORAL_TABLET | Freq: Every day | ORAL | Status: DC
  Administered 2018-05-16 – 2018-05-23 (×8): 80 mg via ORAL
  Filled 2018-05-16 (×4): qty 4
  Filled 2018-05-16 (×4): qty 1
  Filled 2018-05-16 (×3): qty 4
  Filled 2018-05-16 (×5): qty 1

## 2018-05-16 MED ORDER — OXYCODONE-ACETAMINOPHEN 10-325 MG PO TABS: 1.0000 | ORAL_TABLET | Freq: Three times a day (TID) | ORAL | Status: DC | PRN

## 2018-05-16 MED ORDER — WARFARIN - PHARMACIST DOSING INPATIENT
Freq: Every day | Status: DC
  Administered 2018-05-21: 18:00:00

## 2018-05-16 MED ORDER — LEVOTHYROXINE SODIUM 88 MCG PO TABS
88.0000 ug | ORAL_TABLET | Freq: Every day | ORAL | Status: DC
  Administered 2018-05-17 – 2018-05-27 (×11): 88 ug via ORAL
  Filled 2018-05-16 (×13): qty 1

## 2018-05-16 MED ORDER — VANCOMYCIN HCL 500 MG IV SOLR
500.0000 mg | Freq: Once | INTRAVENOUS | Status: AC
  Administered 2018-05-16: 07:00:00 500 mg via INTRAVENOUS
  Filled 2018-05-16: qty 500

## 2018-05-16 MED ORDER — MONTELUKAST SODIUM 10 MG PO TABS
10.0000 mg | ORAL_TABLET | Freq: Every day | ORAL | Status: DC
  Administered 2018-05-18 – 2018-05-26 (×7): 10 mg via ORAL
  Filled 2018-05-16 (×8): qty 1

## 2018-05-16 MED ORDER — CLOPIDOGREL BISULFATE 75 MG PO TABS
75.0000 mg | ORAL_TABLET | Freq: Every day | ORAL | Status: DC
  Administered 2018-05-18 – 2018-05-23 (×6): 75 mg via ORAL
  Filled 2018-05-16 (×7): qty 1

## 2018-05-16 MED ORDER — AZITHROMYCIN 500 MG PO TABS
500.0000 mg | ORAL_TABLET | ORAL | Status: DC
  Filled 2018-05-16: qty 1

## 2018-05-16 NOTE — ED Notes (Signed)
Attempted to call report.  ICU reports will call back when able to take.

## 2018-05-16 NOTE — ED Notes (Signed)
hospitalist in to see pt.

## 2018-05-16 NOTE — Progress Notes (Signed)
Inpatient Diabetes Program Recommendations  AACE/ADA: New Consensus Statement on Inpatient Glycemic Control  Target Ranges:  Prepandial:   less than 140 mg/dL      Peak postprandial:   less than 180 mg/dL (1-2 hours)      Critically ill patients:  140 - 180 mg/dL   Results for Alexandra Goodman, Alexandra Goodman (MRN [id number]) as of 2018-05-24 09:19  Ref. Range 05/24/18 08:50  Glucose-Capillary Latest Ref Range: 70 - 99 mg/dL 063 (H)  Results for Alexandra Goodman, Alexandra Goodman (MRN [id number]) as of 24-May-2018 09:19  Ref. Range 2018/05/24 04:28  Glucose Latest Ref Range: 70 - 99 mg/dL 355 (H)   Review of Glycemic Control  Diabetes history: DM2 Outpatient Diabetes medications: Lantus 46 units QHS, Metformin 1000 mg BID Current orders for Inpatient glycemic control: Metformin 1000 mg BID  Inpatient Diabetes Program Recommendations:   Correction (SSI): Please consider ordering CBGs AC&HS with Novolog 0-15 units TID with meals and Novolog 0-5 units QHS.  Insulin - Basal: If CBGs are consistently greater than 180 mg/dl with Novolog correction, please consider odering Lantus.  Thanks, Orlando Penner, RN, MSN, CDE Diabetes Coordinator Inpatient Diabetes Program [phone] (Team Pager from 8am to 5pm)

## 2018-05-16 NOTE — ED Notes (Signed)
Pt hypotensive, md notified, order for ns bolus received.

## 2018-05-16 NOTE — ED Triage Notes (Addendum)
EMS pt from home with c/o shortness of breath; says she just can't get any air in; pt with history of COPD; pt says she wears 4L via [place] but first responders weren't sure she was actually wearing it upon their arrival; sats were 68% when they arrived; NRB placed at home and sats up to 90's; pt arrives with c/o no air movement;

## 2018-05-16 NOTE — H&P (Signed)
[facility] [place] NAME: Alexandra Goodman    MR#:  [id number]  DATE OF BIRTH:  March 25, 1960  DATE OF ADMISSION:  04/24/2018  PRIMARY CARE PHYSICIAN: Erline Levine, MD   REQUESTING/REFERRING PHYSICIAN: Hinda Kehr, MD  CHIEF COMPLAINT:   Chief Complaint  Patient presents with  . Respiratory Distress    HISTORY OF PRESENT ILLNESS:  Alexandra Goodman  is a 58 y.o. female with a known history of COPD, CHF, peripheral vascular disease, and a recent specialization, 3 weeks ago, for treatment of pneumonia.  She was discharged home on IV cefazolin to left PICC line.  She now returns to the emergency room complaining of acute increase in shortness of breath over the last day with chest tightness despite use of home oxygen therapy at 4 L/min.  She denies noting fevers or chills.  She denies chest pain.  She denies abdominal pain.  She denies nausea, vomiting, diarrhea.  She has noted a cough productive of thick gray mucus.  Rapid COVID-19 screening is negative.  Lactic acid is 2.3 upon arrival.  She was started on BiPAP therapy shortly after arrival to the emergency room with improved dyspnea.  She is completing second liter normal saline for lactic acid 2.3.  Troponin was 0.31 on her arrival.  Again she denies chest pain.  EKG is pending.  She is on Coumadin for history of peripheral vascular disease.  Chest x-ray demonstratesBilateral pleural effusions are present.  Bibasilar airspace disease likely reflects atelectasis. Infection is not excluded.  He has been admitted to the hospitalist service/ICU for close monitoring.  PAST MEDICAL HISTORY:   Past Medical History:  Diagnosis Date  . Asthma   . CHF (congestive heart failure) ([place])   . Chronic pain   . COPD (chronic obstructive pulmonary disease) ([place])   . Diabetes mellitus ([place])   . Peripheral vascular disease ([place])   . Stroke (cerebrum) ([place])     PAST SURGICAL HISTORY:   Past Surgical  History:  Procedure Laterality Date  . below the knee LLE amputation Left     SOCIAL HISTORY:   Social History   Tobacco Use  . Smoking status: Current Every Day Smoker    Packs/day: 2.00    Years: 45.00    Pack years: 90.00    Types: Cigarettes  . Smokeless tobacco: Never Used  Substance Use Topics  . Alcohol use: Not Currently    FAMILY HISTORY:   Family History  Problem Relation Age of Onset  . Heart disease Mother   . Heart disease Father     DRUG ALLERGIES:   Allergies  Allergen Reactions  . Skin Protectants, Misc. Rash  . Flagyl [Metronidazole]   . Gabapentin   . Levetiracetam   . Tape Other (See Comments)    blisters  . Ace Inhibitors Rash  . Ertapenem Rash  . Sertraline Hcl Rash    REVIEW OF SYSTEMS:   ROS As per history of present illness. All pertinent systems were reviewed above. Constitutional,  HEENT, cardiovascular, respiratory, GI, GU, musculoskeletal, neuro, psychiatric, endocrine,  integumentary and hematologic systems were reviewed and are otherwise  negative/unremarkable except for positive findings mentioned above in the HPI.   MEDICATIONS AT HOME:   Prior to Admission medications   Medication Sig Start Date End Date Taking? Authorizing Provider  albuterol (PROAIR HFA) 108 (90 Base) MCG/ACT inhaler Inhale 2 puffs into the lungs every 4 (four) hours as needed for wheezing. 04/04/18  [provider]  amiodarone (PACERONE) 200 MG tablet Take 1 tablet (200 mg total) by mouth 2 (two) times daily for 30 days. 04/27/18 Jun 18, 2018  Saundra Shelling, MD  aspirin EC 81 MG EC tablet Take 1 tablet (81 mg total) by mouth daily for 30 days. 04/28/18 05/28/18  Saundra Shelling, MD  atorvastatin (LIPITOR) 80 MG tablet Take 80 mg by mouth Nightly. 12/06/17 12/06/18  [provider]  ceFAZolin (ANCEF) IVPB Inject 2 g into the vein every 8 (eight) hours. Indication:  MSSA bacteremia and lumbar discitis Last Day of Therapy:  06/04/2018 Labs - Once  weekly (on Mon):  CBC/D and CMP, Labs - Every other week (on Mon):  ESR and CRP 04/27/18 06/04/18  Saundra Shelling, MD  clopidogrel (PLAVIX) 75 MG tablet Take 75 mg by mouth daily. 02/22/18   [provider]  famotidine (PEPCID) 40 MG tablet Take 40 mg by mouth every evening. 11/03/17 11/03/18  [provider]  FLUoxetine (PROZAC) 20 MG capsule Take 20 mg by mouth daily. 02/02/18   [provider]  fluticasone (FLONASE) 50 MCG/ACT nasal spray Place 1 spray into both nostrils daily. 07/30/15   [provider]  fluticasone-salmeterol (ADVAIR HFA) 230-21 MCG/ACT inhaler Inhale 2 puffs into the lungs 2 (two) times daily. 05/10/16   [provider]  furosemide (LASIX) 40 MG tablet Take 40-80 mg by mouth 2 (two) times daily. 80 mg every morning and 40 mg every evening 11/25/17   [provider]  Insulin Glargine (LANTUS SOLOSTAR) 100 UNIT/ML Solostar Pen Inject 46 Units into the skin at bedtime. 02/21/18   [provider]  levothyroxine (SYNTHROID, LEVOTHROID) 88 MCG tablet Take 88 mcg by mouth daily. 02/16/18   [provider]  losartan (COZAAR) 100 MG tablet Take 100 mg by mouth daily. 01/10/18   [provider]  metFORMIN (GLUCOPHAGE) 1000 MG tablet Take 1,000 mg by mouth 2 (two) times daily. 01/13/18   [provider]  mirabegron ER (MYRBETRIQ) 25 MG TB24 tablet Take 25 mg by mouth daily. 11/03/17   [provider]  montelukast (SINGULAIR) 10 MG tablet Take 10 mg by mouth daily. 04/05/17   [provider]  naloxone [facility]) nasal spray 4 mg/0.1 mL Place 4 mg into the nose once. 04/23/16   [provider]  nitroGLYCERIN (NITROSTAT) 0.4 MG SL tablet Place 0.4 mg under the tongue as needed. 11/08/17   [provider]  oxyCODONE-acetaminophen (PERCOCET) 10-325 MG tablet Take 1 tablet by mouth every 6 (six) hours as needed for pain. 04/27/18   Saundra Shelling, MD  predniSONE (DELTASONE) 20 MG tablet  Take 40 mg by mouth daily. 03/23/18   [provider]  senna-docusate (SENOKOT-S) 8.6-50 MG tablet Take 2 tablets by mouth daily. 06/08/17 06/08/18  [provider]  tiotropium (SPIRIVA HANDIHALER) 18 MCG inhalation capsule Place 1 capsule into inhaler and inhale daily. 10/29/16   [provider]  traZODone (DESYREL) 50 MG tablet Take 150 mg by mouth at bedtime. 08/30/17   [provider]  warfarin (COUMADIN) 5 MG tablet Take 7.5 mg by mouth daily. 02/02/18   [provider]      VITAL SIGNS:  Blood pressure (!) 90/55, pulse 99, temperature 98.2 F (36.8 C), temperature source Axillary, resp. rate 16, weight 66 kg, SpO2 99 %.  PHYSICAL EXAMINATION:  Physical Exam Constitutional:      General: She is not in acute distress.    Appearance: She is ill-appearing.  HENT:  Head: Normocephalic.     Nose: Nose normal.     Mouth/Throat:     Pharynx: Oropharynx is clear.  Eyes:     General: No scleral icterus.    Conjunctiva/sclera: Conjunctivae normal.     Pupils: Pupils are equal, round, and reactive to light.  Neck:     Musculoskeletal: Normal range of motion and neck supple.  Cardiovascular:     Rate and Rhythm: Normal rate and regular rhythm.     Pulses: Normal pulses.     Heart sounds: Normal heart sounds. No murmur.  Pulmonary:     Effort: Respiratory distress present.     Breath sounds: Wheezing, rhonchi and rales present.  Abdominal:     General: Bowel sounds are normal. There is no distension.     Palpations: Abdomen is soft. There is no mass.     Tenderness: There is no abdominal tenderness.  Musculoskeletal:        General: No swelling.     Right lower leg: No edema.     Left lower leg: No edema.  Skin:    General: Skin is warm and dry.     Capillary Refill: Capillary refill takes less than 2 seconds.     Findings: Lesion present.  Neurological:     Mental Status: She is alert and oriented to person, place, and time.      Motor: Weakness present.  Psychiatric:        Mood and Affect: Mood normal.       LABORATORY PANEL:   CBC Recent Labs  Lab 04/20/2018 0428  WBC 14.7*  HGB 9.6*  HCT 33.1*  PLT 534*   ------------------------------------------------------------------------------------------------------------------  Chemistries  Recent Labs  Lab 05/05/2018 0428  NA 136  K 4.5  CL 97*  CO2 26  GLUCOSE 190*  BUN 31*  CREATININE 1.02*  CALCIUM 8.6*  MG 1.6*  AST 16  ALT <5  ALKPHOS 138*  BILITOT 0.5   ------------------------------------------------------------------------------------------------------------------  Cardiac Enzymes Recent Labs  Lab 04/29/2018 0428  TROPONINI 0.31*   ------------------------------------------------------------------------------------------------------------------  RADIOLOGY:  Dg Chest Port 1 View  Result Date: 05/11/2018 CLINICAL DATA:  Shortness of breath. Personal history of COPD. No Mon yeah. EXAM: PORTABLE CHEST 1 VIEW COMPARISON:  One-view chest x-ray 04/22/2018 FINDINGS: Heart size is mildly enlarged. Atherosclerotic changes are again noted at the aortic arch. A diffuse interstitial pattern is superimposed on chronic disease. Bilateral pleural effusions are present. Bibasilar airspace disease likely reflects atelectasis. No other significant airspace consolidation is present. IMPRESSION: 1. Cardiomegaly with a diffuse interstitial pattern superimposed on chronic COPD. Findings are most concerning for edema and congestive heart failure. 2. Bilateral pleural effusions are present. 3. Bibasilar airspace disease likely reflects atelectasis. Infection is not excluded. Electronically Signed   By: San Morelle M.D.   On: 05/03/2018 06:01      IMPRESSION AND PLAN:   1. acute hypoxic respiratory failure - Patient placed on BiPAP shortly after arrival to the emergency room with improvement in dyspnea and hypoxia - She is on maintenance home oxygen  therapy at 4 L/min per nasal cannula. - Rapid COVID-19 testing is negative  2.  Acute exacerbation COPD - She has been started on azithromycin and Rocephin - DuoNebs every 6 hours as needed for shortness of breath - We will add Mucinex - Solu-Medrol IV every 8 hours  3.  Peripheral vascular disease - Coumadin continued  4.  Hypomagnesemia - IV magnesium replacement 2 g - Patient is on telemetry  monitoring - Repeat magnesium level in the a.m.  5.  Elevated troponin - Continue to trend troponin levels - This is felt likely to be secondary to acute respiratory failure  6.  DVT prophylaxis was initiated with Lovenox   All the records are reviewed and case discussed with ED provider. The plan of care was discussed in details with the patient (and family). I answered all questions. The patient agreed to proceed with the above mentioned plan. Further management will depend upon hospital course.   CODE STATUS: Full code  TOTAL TIME TAKING CARE OF THIS PATIENT: 45 minutes.    [place] on 04/30/2018 at 7:14 AM  Pager - [phone]  After 6pm go to [email or web address] - Proofreader  [facility]  Office  [phone]  CC: Primary care physician; Erline Levine, MD   Note: This dictation was prepared with Dragon dictation along with smaller phrase technology. Any transcriptional errors that result from this process are unintentional.

## 2018-05-16 NOTE — Consult Note (Signed)
Pharmacy Antibiotic Note  Alexandra Goodman is a 58 y.o. female admitted on 06/11/18 with COPD exacerbation. Patient was recently discharged with pneumonia and received cefazolin via PICC at home. Patient now has rash. ID is consulted.  Pharmacy has been consulted for Vancomycin dosing.  Plan: Scr baseline appears to be ~0.7, Scr today 1.02 Will order Vancomycin 1250 mg IV q24h starting tomorrow as patient received 1500 mg this AM at 0600.  Vancomycin Kinetics Using Scr 1.02, IBW, and Vd 0.72 Goal AUC 400-550 Anticipated AUC 522.1   Height: 5\' 3"  (160 cm) Weight: 158 lb 11.7 oz (72 kg) IBW/kg (Calculated) : 52.4  Temp (24hrs), Avg:98.1 F (36.7 C), Min:98 F (36.7 C), Max:98.2 F (36.8 C)  Recent Labs  Lab Jun 11, 2018 0428 2018/06/11 0626  WBC 14.7*  --   CREATININE 1.02*  --   LATICACIDVEN 2.3* 2.1*    Estimated Creatinine Clearance: 57.8 mL/min (A) (by C-G formula based on SCr of 1.02 mg/dL (H)).    Allergies  Allergen Reactions  . Skin Protectants, Misc. Rash  . Flagyl [Metronidazole]   . Gabapentin   . Levetiracetam   . Tape Other (See Comments)    blisters  . Ace Inhibitors Rash  . Ertapenem Rash  . Sertraline Hcl Rash    Antimicrobials this admission: 4/27 Azithromycin x 1 4/27 Cefepime x 1 4/27 Vancomycin >>  Dose adjustments this admission: N/A  Microbiology results: 4/27 BCx: NGTD  4/27 Sputum: pending  4/27 MRSA PCR: pending  Thank you for allowing pharmacy to be a part of this patient's care.  Mauri Reading, PharmD Pharmacy Resident  2018-06-11 3:53 PM

## 2018-05-16 NOTE — ED Notes (Signed)
Dr York Cerise informed of elevated Troponin 0.31; acknowledged

## 2018-05-16 NOTE — ED Notes (Signed)
Per david in pharmacy, pt to receive 1500mg  total of vancomycin. Will continue 1000mg  already hung then hang an additional 500mg . Pt tolerating bipap well. Blood pressure improving. Call bell remains at left side.

## 2018-05-16 NOTE — Consult Note (Signed)
Infectious Disease     Reason for Consult:MSSA bacteremia, Rash    Referring Physician: Earleen Newport, R Date of Admission:  05/19/2018   Active Problems:   Acute exacerbation of chronic obstructive pulmonary disease (COPD) ([place])   Pneumonia   HPI: Alexandra Goodman is a 58 y.o. female with hx of recent MSSA bacteremia and L spine discitis, currently on IV ancef at home admitted with SOB, wheeze and presumed COPD exacerbation. She has also developed a rash and had called home health over the weekend and was advised to hold the ancef until follow up.  She reports compliance with the ancef and has been on it since at least her dc on 4/7.  She is somewhat confused today but is able to converse and answer questions. She reports continued LBP but denies fevers, chills.  She is not sure when the rash began. It is very itchy.   Past Medical History:  Diagnosis Date   Asthma    CHF (congestive heart failure) (HCC)    Chronic pain    COPD (chronic obstructive pulmonary disease) ([place])    Diabetes mellitus ([place])    Peripheral vascular disease (HCC)    Stroke (cerebrum) (HCC)    Past Surgical History:  Procedure Laterality Date   below the knee LLE amputation Left    Social History   Tobacco Use   Smoking status: Current Every Day Smoker    Packs/day: 2.00    Years: 45.00    Pack years: 90.00    Types: Cigarettes   Smokeless tobacco: Never Used  Substance Use Topics   Alcohol use: Not Currently   Drug use: Never   Family History  Problem Relation Age of Onset   Heart disease Mother    Heart disease Father     Allergies:  Allergies  Allergen Reactions   Skin Protectants, Misc. Rash   Flagyl [Metronidazole]    Gabapentin    Levetiracetam    Tape Other (See Comments)    blisters   Ace Inhibitors Rash   Ertapenem Rash   Sertraline Hcl Rash    Current antibiotics: Antibiotics Given (last 72 hours)    Date/Time Action Medication Dose Rate   04/21/2018 0543 New  Bag/Given   vancomycin (VANCOCIN) IVPB 1000 mg/200 mL premix 1,000 mg 200 mL/hr   04/20/2018 0549 New Bag/Given   ceFEPIme (MAXIPIME) 2 g in sodium chloride 0.9 % 100 mL IVPB 2 g 200 mL/hr   05/07/2018 0648 New Bag/Given   vancomycin (VANCOCIN) 500 mg in sodium chloride 0.9 % 100 mL IVPB 500 mg 100 mL/hr   05/11/2018 1100 New Bag/Given   cefTRIAXone (ROCEPHIN) 1 g in sodium chloride 0.9 % 100 mL IVPB 1 g 200 mL/hr      MEDICATIONS:  amiodarone  200 mg Oral BID   aspirin EC  81 mg Oral Daily   atorvastatin  80 mg Oral QHS   clopidogrel  75 mg Oral Daily   famotidine  40 mg Oral QPM   FLUoxetine  20 mg Oral Daily   fluticasone  1 spray Each Nare Daily   guaiFENesin  600 mg Oral BID   ipratropium-albuterol  3 mL Nebulization Q6H   levothyroxine  88 mcg Oral Daily   metFORMIN  1,000 mg Oral BID WC   mirabegron ER  25 mg Oral Daily   mometasone-formoterol  2 puff Inhalation BID   montelukast  10 mg Oral Daily   tiotropium  1 capsule Inhalation Daily   traZODone  150 mg Oral QHS   warfarin  7.5 mg Oral q1800   Warfarin - Physician Dosing Inpatient   Does not apply q1800    Review of Systems - 11 systems reviewed and negative per HPI   OBJECTIVE: Temp:  [98 F (36.7 C)-98.2 F (36.8 C)] 98 F (36.7 C) (04/27 0900) Pulse Rate:  [90-120] 95 (04/27 0900) Resp:  [13-26] 17 (04/27 0900) BP: (71-110)/(43-75) 104/67 (04/27 0900) SpO2:  [97 %-100 %] 100 % (04/27 0900) FiO2 (%):  [100 %] 100 % (04/27 0506) Weight:  [66 kg-72 kg] 72 kg (04/27 0900) Physical Exam  Constitutional:  Awake and interactive, disheveled.  HENT: [place]/AT, PERRLA, no scleral icterus Mouth/Throat: Oropharynx is clear and moist. No oropharyngeal exudate.  Cardiovascular: Normal rate, regular rhythm and normal heart sounds. Pulmonary/Chest: + exp wheeze Neck = supple, no nuchal rigidity Abdominal: Soft. Bowel sounds are normal.  exhibits no distension. There is no tenderness.  Lymphadenopathy: no  cervical adenopathy. No axillary adenopathy Neurological: alert and oriented to person, place, and time.  Skin: on her arms and legs she has numerous bright red, almost targetoid lesions. No drainage, or purulence. Spares palms and soles.  Psychiatric: flat affect, slowed mentation          LABS: Results for orders placed or performed during the hospital encounter of 04/25/2018 (from the past 48 hour(s))  SARS Coronavirus 2 [facility] order, Performed in [facility] hospital lab)     Status: None   Collection Time: 04/23/2018  4:28 AM  Result Value Ref Range   SARS Coronavirus 2 NEGATIVE NEGATIVE    Comment: (NOTE) If result is NEGATIVE SARS-CoV-2 target nucleic acids are NOT DETECTED. The SARS-CoV-2 RNA is generally detectable in upper and lower  respiratory specimens during the acute phase of infection. The lowest  concentration of SARS-CoV-2 viral copies this assay can detect is 250  copies / mL. A negative result does not preclude SARS-CoV-2 infection  and should not be used as the sole basis for treatment or other  patient management decisions.  A negative result may occur with  improper specimen collection / handling, submission of specimen other  than nasopharyngeal swab, presence of viral mutation(s) within the  areas targeted by this assay, and inadequate number of viral copies  (<250 copies / mL). A negative result must be combined with clinical  observations, patient history, and epidemiological information. If result is POSITIVE SARS-CoV-2 target nucleic acids are DETECTED. The SARS-CoV-2 RNA is generally detectable in upper and lower  respiratory specimens dur ing the acute phase of infection.  Positive  results are indicative of active infection with SARS-CoV-2.  Clinical  correlation with patient history and other diagnostic information is  necessary to determine patient infection status.  Positive results do  not rule out bacterial infection or co-infection with  other viruses. If result is PRESUMPTIVE POSTIVE SARS-CoV-2 nucleic acids MAY BE PRESENT.   A presumptive positive result was obtained on the submitted specimen  and confirmed on repeat testing.  While 2019 novel coronavirus  (SARS-CoV-2) nucleic acids may be present in the submitted sample  additional confirmatory testing may be necessary for epidemiological  and / or clinical management purposes  to differentiate between  SARS-CoV-2 and other Sarbecovirus currently known to infect humans.  If clinically indicated additional testing with an alternate test  methodology [phone]) is advised. The SARS-CoV-2 RNA is generally  detectable in upper and lower respiratory sp ecimens during the acute  phase of infection. The expected  result is Negative. Fact Sheet for Patients:  StrictlyIdeas.no Fact Sheet for Healthcare Providers: BankingDealers.co.za This test is not yet approved or cleared by the [place] FDA and has been authorized for detection and/or diagnosis of SARS-CoV-2 by FDA under an Emergency Use Authorization (EUA).  This EUA will remain in effect (meaning this test can be used) for the duration of the COVID-19 declaration under Section 564(b)(1) of the Act, 21 U.S.C. section 360bbb-3(b)(1), unless the authorization is terminated or revoked sooner. Performed at [facility], [address] [place], [place] [address]   Lactic acid, plasma     Status: Abnormal   Collection Time: 05/11/2018  4:28 AM  Result Value Ref Range   Lactic Acid, Venous 2.3 (HH) 0.5 - 1.9 mmol/L    Comment: CRITICAL RESULT CALLED TO, READ BACK BY AND VERIFIED WITH Dorthula Rue RN 05/02/2018 @ 0524 RDW Performed at [facility], [address] [place], [place] [address]   CBC WITH DIFFERENTIAL     Status: Abnormal   Collection Time: 05/04/2018  4:28 AM  Result Value Ref Range   WBC 14.7 (H) 4.0 - 10.5 K/uL   RBC 3.69 (L) 3.87 - 5.11  MIL/uL   Hemoglobin 9.6 (L) 12.0 - 15.0 g/dL   HCT 33.1 (L) 36.0 - 46.0 %   MCV 89.7 80.0 - 100.0 fL   MCH 26.0 26.0 - 34.0 pg   MCHC 29.0 (L) 30.0 - 36.0 g/dL   RDW 24.0 (H) 11.5 - 15.5 %   Platelets 534 (H) 150 - 400 K/uL   nRBC 0.0 0.0 - 0.2 %   Neutrophils Relative % 86 %   Neutro Abs 13.1 (H) 1.7 - 7.7 K/uL   Lymphocytes Relative 8 %   Lymphs Abs 1.2 0.7 - 4.0 K/uL   Monocytes Relative 4 %   Monocytes Absolute 0.5 0.1 - 1.0 K/uL   Eosinophils Relative 1 %   Eosinophils Absolute 0.2 0.0 - 0.5 K/uL   Basophils Relative 0 %   Basophils Absolute 0.0 0.0 - 0.1 K/uL   WBC Morphology MORPHOLOGY UNREMARKABLE    Smear Review Normal platelet morphology    Immature Granulocytes 1 %   Abs Immature Granulocytes 0.09 (H) 0.00 - 0.07 K/uL   Polychromasia PRESENT     Comment: Performed at [facility], [address] [place], [place] [address]  Comprehensive metabolic panel     Status: Abnormal   Collection Time: 04/23/2018  4:28 AM  Result Value Ref Range   Sodium 136 135 - 145 mmol/L   Potassium 4.5 3.5 - 5.1 mmol/L   Chloride 97 (L) 98 - 111 mmol/L   CO2 26 22 - 32 mmol/L   Glucose, Bld 190 (H) 70 - 99 mg/dL   BUN 31 (H) 6 - 20 mg/dL   Creatinine, Ser 1.02 (H) 0.44 - 1.00 mg/dL   Calcium 8.6 (L) 8.9 - 10.3 mg/dL   Total Protein 6.5 6.5 - 8.1 g/dL   Albumin 2.4 (L) 3.5 - 5.0 g/dL   AST 16 15 - 41 U/L   ALT <5 0 - 44 U/L   Alkaline Phosphatase 138 (H) 38 - 126 U/L   Total Bilirubin 0.5 0.3 - 1.2 mg/dL   GFR calc non Af Amer >60 >60 mL/min   GFR calc Af Amer >60 >60 mL/min   Anion gap 13 5 - 15    Comment: Performed at [facility], [address], [place], [place] [address]  Fibrin derivatives D-Dimer  Status: Abnormal   Collection Time: 04/27/2018  4:28 AM  Result Value Ref Range   Fibrin derivatives D-dimer ([facility]) 2,149.32 (H) 0.00 - 499.00 ng/mL (FEU)    Comment: (NOTE) <> Exclusion of Venous Thromboembolism (VTE) - OUTPATIENT ONLY   (Emergency  Department or [place])   0-499 ng/ml (FEU): With a low to intermediate pretest probability                      for VTE this test result excludes the diagnosis                      of VTE.   >499 ng/ml (FEU) : VTE not excluded; additional work up for VTE is                      required. <> Testing on Inpatients and Evaluation of Disseminated Intravascular   Coagulation (DIC) Reference Range:   0-499 ng/ml (FEU) Performed at [facility], [address] [place], [place] [address]   Procalcitonin     Status: None   Collection Time: 05/11/2018  4:28 AM  Result Value Ref Range   Procalcitonin 0.57 ng/mL    Comment:        Interpretation: PCT > 0.5 ng/mL and <= 2 ng/mL: Systemic infection (sepsis) is possible, but other conditions are known to elevate PCT as well. (NOTE)       Sepsis PCT Algorithm           Lower Respiratory Tract                                      Infection PCT Algorithm    ----------------------------     ----------------------------         PCT < 0.25 ng/mL                PCT < 0.10 ng/mL         Strongly encourage             Strongly discourage   discontinuation of antibiotics    initiation of antibiotics    ----------------------------     -----------------------------       PCT 0.25 - 0.50 ng/mL            PCT 0.10 - 0.25 ng/mL               OR       >80% decrease in PCT            Discourage initiation of                                            antibiotics      Encourage discontinuation           of antibiotics    ----------------------------     -----------------------------         PCT >= 0.50 ng/mL              PCT 0.26 - 0.50 ng/mL                AND       <80% decrease in PCT             Encourage initiation of  antibiotics       Encourage continuation           of antibiotics    ----------------------------     -----------------------------        PCT >= 0.50 ng/mL                  PCT >  0.50 ng/mL               AND         increase in PCT                  Strongly encourage                                      initiation of antibiotics    Strongly encourage escalation           of antibiotics                                     -----------------------------                                           PCT <= 0.25 ng/mL                                                 OR                                        > 80% decrease in PCT                                     Discontinue / Do not initiate                                             antibiotics Performed at [facility], [address] [place], [place] [address]   Lactate dehydrogenase     Status: None   Collection Time: 05/05/2018  4:28 AM  Result Value Ref Range   LDH 179 98 - 192 U/L    Comment: Performed at [facility] [address] [place], [place] [address]  Ferritin     Status: None   Collection Time: 05/11/2018  4:28 AM  Result Value Ref Range   Ferritin 44 11 - 307 ng/mL    Comment: Performed at [facility], [address] [place], [place] [address]  Triglycerides     Status: None   Collection Time: 05/13/2018  4:28 AM  Result Value Ref Range   Triglycerides 99 <150 mg/dL    Comment: Performed at [facility], [address], [place], [place] [address]  Fibrinogen     Status: Abnormal   Collection Time: 05/17/2018  4:28 AM  Result Value Ref Range   Fibrinogen 672 (  H) 210 - 475 mg/dL    Comment: Performed at [facility], [address] [place], [place] [address]  Troponin I - Once     Status: Abnormal   Collection Time: 05/04/2018  4:28 AM  Result Value Ref Range   Troponin I 0.31 (HH) <0.03 ng/mL    Comment: CRITICAL RESULT CALLED TO, READ BACK BY AND VERIFIED WITH Dorthula Rue RN 04/20/2018 @ 0524 Performed at [facility], [address] [place], [place] [address]   Brain natriuretic peptide     Status: Abnormal   Collection Time:  05/12/2018  4:28 AM  Result Value Ref Range   B Natriuretic Peptide 852.0 (H) 0.0 - 100.0 pg/mL    Comment: Performed at [facility], [address], [place], [place] [address]  Magnesium     Status: Abnormal   Collection Time: 04/26/2018  4:28 AM  Result Value Ref Range   Magnesium 1.6 (L) 1.7 - 2.4 mg/dL    Comment: Performed at [facility], [address] [place], [place] [address]  Protime-INR     Status: Abnormal   Collection Time: 04/30/2018  4:28 AM  Result Value Ref Range   Prothrombin Time 21.7 (H) 11.4 - 15.2 seconds   INR 1.9 (H) 0.8 - 1.2    Comment: (NOTE) INR goal varies based on device and disease states. Performed at [facility], [address] [place], [place] [address]   Influenza panel by PCR (type A & B)     Status: None   Collection Time: 05/17/2018  4:28 AM  Result Value Ref Range   Influenza A By PCR NEGATIVE NEGATIVE   Influenza B By PCR NEGATIVE NEGATIVE    Comment: (NOTE) The Xpert Xpress Flu assay is intended as an aid in the diagnosis of  influenza and should not be used as a sole basis for treatment.  This  assay is FDA approved for nasopharyngeal swab specimens only. Nasal  washings and aspirates are unacceptable for Xpert Xpress Flu testing. Performed at [facility], [address] [place], [place] [address]   Blood Culture (routine x 2)     Status: None (Preliminary result)   Collection Time: 05/15/2018  4:51 AM  Result Value Ref Range   Specimen Description BLOOD RIGHT FOREARM    Special Requests      BOTTLES DRAWN AEROBIC AND ANAEROBIC Blood Culture results may not be optimal due to an excessive volume of blood received in culture bottles   Culture      NO GROWTH < 12 HOURS Performed at [facility], [address], [place], [place] [address]    Report Status PENDING   Blood Culture (routine x 2)     Status: None (Preliminary result)   Collection Time: 05/13/2018  4:51 AM  Result Value Ref  Range   Specimen Description BLOOD RIGHT HAND    Special Requests      BOTTLES DRAWN AEROBIC AND ANAEROBIC Blood Culture results may not be optimal due to an inadequate volume of blood received in culture bottles   Culture      NO GROWTH < 12 HOURS Performed at [facility], [address], [place], [place] [address]    Report Status PENDING   C-reactive protein     Status: Abnormal   Collection Time: 04/30/2018  4:51 AM  Result Value Ref Range   CRP 5.0 (H) <1.0 mg/dL    Comment: Performed at [facility],  [address], [place], [place] [address]  Blood gas, venous     Status: Abnormal   Collection Time: 05/18/2018  4:51 AM  Result Value Ref Range   FIO2 1.00    pH, Ven 7.32 7.250 - 7.430   pCO2, Ven 59 44.0 - 60.0 mmHg   pO2, Ven 87.0 (H) 32.0 - 45.0 mmHg   Bicarbonate 30.4 (H) 20.0 - 28.0 mmol/L   Acid-Base Excess 2.8 (H) 0.0 - 2.0 mmol/L   O2 Saturation 95.8 %   Patient temperature 37.0    Collection site VENOUS    Drawn by VENOUS    Sample type VENOUS     Comment: Performed at [facility], [address] [place], [place] [address]  Lactic acid, plasma     Status: Abnormal   Collection Time: 05/03/2018  6:26 AM  Result Value Ref Range   Lactic Acid, Venous 2.1 (HH) 0.5 - 1.9 mmol/L    Comment: CRITICAL VALUE NOTED. VALUE IS CONSISTENT WITH PREVIOUSLY REPORTED/CALLED VALUE RDW Performed at [facility], [address] [place], [place] [address]   Troponin I - Now Then Q6H     Status: Abnormal   Collection Time: 04/27/2018  7:35 AM  Result Value Ref Range   Troponin I 0.39 (HH) <0.03 ng/mL    Comment: CRITICAL VALUE NOTED. VALUE IS CONSISTENT WITH PREVIOUSLY REPORTED/CALLED VALUE DAS Performed at [facility], [address] [place], [place] [address]   Glucose, capillary     Status: Abnormal   Collection Time: 05/19/2018  8:50 AM  Result Value Ref Range   Glucose-Capillary 255 (H) 70 - 99 mg/dL   No components found for:  ESR, C REACTIVE PROTEIN MICRO: Recent Results (from the past 720 hour(s))  Blood Culture (routine x 2)     Status: Abnormal   Collection Time: 04/20/18  2:46 PM  Result Value Ref Range Status   Specimen Description   Final    BLOOD BLOOD RIGHT FOREARM Performed at [facility], [address], [place], [place] [address]    Special Requests   Final    BOTTLES DRAWN AEROBIC AND ANAEROBIC Blood Culture results may not be optimal due to an excessive volume of blood received in culture bottles Performed at [facility], [address], [place], [place] [address]    Culture  Setup Time   Final    GRAM POSITIVE COCCI IN BOTH AEROBIC AND ANAEROBIC BOTTLES CRITICAL RESULT CALLED TO, READ BACK BY AND VERIFIED WITH: SHEEMA HALLAJI 04/21/2018 222 REC Performed at [facility], [address] [place], [place] [address]    Culture STAPHYLOCOCCUS AUREUS (A)  Final   Report Status 04/23/2018 FINAL  Final   Organism ID, Bacteria STAPHYLOCOCCUS AUREUS  Final      Susceptibility   Staphylococcus aureus - MIC*    CIPROFLOXACIN >=8 RESISTANT Resistant     ERYTHROMYCIN >=8 RESISTANT Resistant     GENTAMICIN <=0.5 SENSITIVE Sensitive     OXACILLIN <=0.25 SENSITIVE Sensitive     TETRACYCLINE <=1 SENSITIVE Sensitive     VANCOMYCIN 1 SENSITIVE Sensitive     TRIMETH/SULFA <=10 SENSITIVE Sensitive     CLINDAMYCIN >=8 RESISTANT Resistant     RIFAMPIN <=0.5 SENSITIVE Sensitive     Inducible Clindamycin NEGATIVE Sensitive     * STAPHYLOCOCCUS AUREUS  Blood Culture (routine x 2)     Status: Abnormal   Collection Time: 04/20/18  2:46 PM  Result Value Ref Range Status  Specimen Description   Final    BLOOD LEFT ANTECUBITAL Performed at [facility], [address], [place], [place] [address]    Special Requests   Final    BOTTLES DRAWN AEROBIC AND ANAEROBIC Blood Culture adequate volume Performed at [facility], [address] [place], [place] [address]     Culture  Setup Time   Final    GRAM POSITIVE COCCI IN BOTH AEROBIC AND ANAEROBIC BOTTLES CRITICAL VALUE NOTED.  VALUE IS CONSISTENT WITH PREVIOUSLY REPORTED AND CALLED VALUE. Performed at [facility], [address] [place], [place] [address]    Culture (A)  Final    STAPHYLOCOCCUS AUREUS SUSCEPTIBILITIES PERFORMED ON PREVIOUS CULTURE WITHIN THE LAST 5 DAYS. Performed at [facility], [place] [address], [place], [place] [address]    Report Status 04/23/2018 FINAL  Final  Urine culture     Status: Abnormal   Collection Time: 04/20/18  2:46 PM  Result Value Ref Range Status   Specimen Description   Final    URINE, RANDOM Performed at [facility], [address] [place], [place] [address]    Special Requests   Final    NONE Performed at [facility], [place], [place] [address]    Culture >=100,000 COLONIES/mL STAPHYLOCOCCUS AUREUS (A)  Final   Report Status 04/22/2018 FINAL  Final   Organism ID, Bacteria STAPHYLOCOCCUS AUREUS (A)  Final      Susceptibility   Staphylococcus aureus - MIC*    CIPROFLOXACIN >=8 RESISTANT Resistant     GENTAMICIN <=0.5 SENSITIVE Sensitive     NITROFURANTOIN <=16 SENSITIVE Sensitive     OXACILLIN <=0.25 SENSITIVE Sensitive     TETRACYCLINE >=16 RESISTANT Resistant     VANCOMYCIN 1 SENSITIVE Sensitive     TRIMETH/SULFA <=10 SENSITIVE Sensitive     CLINDAMYCIN >=8 RESISTANT Resistant     RIFAMPIN <=0.5 SENSITIVE Sensitive     Inducible Clindamycin NEGATIVE Sensitive     * >=100,000 COLONIES/mL STAPHYLOCOCCUS AUREUS  Blood Culture ID Panel (Reflexed)     Status: Abnormal   Collection Time: 04/20/18  2:46 PM  Result Value Ref Range Status   Enterococcus species NOT DETECTED NOT DETECTED Final   Listeria monocytogenes NOT DETECTED NOT DETECTED Final   Staphylococcus species DETECTED (A) NOT DETECTED Final    Comment: CRITICAL RESULT CALLED TO, READ BACK BY AND VERIFIED WITH: SHEEMA HALLAJI 04/21/2018  222 REC    Staphylococcus aureus (BCID) DETECTED (A) NOT DETECTED Final    Comment: Methicillin (oxacillin) susceptible Staphylococcus aureus (MSSA). Preferred therapy is anti staphylococcal beta lactam antibiotic (Cefazolin or Nafcillin), unless clinically contraindicated. CRITICAL RESULT CALLED TO, READ BACK BY AND VERIFIED WITH: SHEEMA HALLAJI 04/21/2018 222 REC    Methicillin resistance NOT DETECTED NOT DETECTED Final   Streptococcus species NOT DETECTED NOT DETECTED Final   Streptococcus agalactiae NOT DETECTED NOT DETECTED Final   Streptococcus pneumoniae NOT DETECTED NOT DETECTED Final   Streptococcus pyogenes NOT DETECTED NOT DETECTED Final   Acinetobacter baumannii NOT DETECTED NOT DETECTED Final   Enterobacteriaceae species NOT DETECTED NOT DETECTED Final   Enterobacter cloacae complex NOT DETECTED NOT DETECTED Final   Escherichia coli NOT DETECTED NOT DETECTED Final   Klebsiella oxytoca NOT DETECTED NOT DETECTED Final   Klebsiella pneumoniae NOT DETECTED NOT DETECTED Final   Proteus species NOT DETECTED NOT DETECTED Final   Serratia marcescens NOT DETECTED NOT DETECTED Final   Haemophilus influenzae NOT DETECTED NOT DETECTED Final  Neisseria meningitidis NOT DETECTED NOT DETECTED Final   Pseudomonas aeruginosa NOT DETECTED NOT DETECTED Final   Candida albicans NOT DETECTED NOT DETECTED Final   Candida glabrata NOT DETECTED NOT DETECTED Final   Candida krusei NOT DETECTED NOT DETECTED Final   Candida parapsilosis NOT DETECTED NOT DETECTED Final   Candida tropicalis NOT DETECTED NOT DETECTED Final    Comment: Performed at [facility], [address] [place], [place] [address]  Novel Coronavirus, NAA (hospital order; send-out to ref lab)     Status: None   Collection Time: 04/20/18  4:03 PM  Result Value Ref Range Status   SARS-CoV-2, NAA NOT DETECTED NOT DETECTED Final    Comment: Negative (Not Detected) results do not exclude infection caused by SARS CoV 2 and  should not be used as the sole basis for treatment or other patient management decisions. Optimum specimen types and timing for peak viral levels during infections caused  by SARS CoV 2 have not been determined. Collection of multiple specimens (types and time points) from the same patient may be necessary to detect the virus. Improper specimen collection and handling, sequence variability underlying assay primers and or probes, or the presence of organisms in  quantities less than the limit of detection of the assay may lead to false negative results. Positive and negative predictive values of testing are highly dependent on prevalence. False negative results are more likely when prevalence of disease is high. (NOTE) The expected result is Negative (Not Detected). The SARS CoV 2 test is intended for the presumptive qualitative  detection of nucleic acid from SARS CoV 2 in upper and lower  respir atory specimens. Testing methodology is real time RT PCR. Test results must be correlated with clinical presentation and  evaluated in the context of other laboratory and epidemiologic data.  Test performance can be affected because the epidemiology and  clinical spectrum of infection caused by SARS CoV 2 is not fully  known. For example, the optimum types of specimens to collect and  when during the course of infection these specimens are most likely  to contain detectable viral RNA may not be known. This test has not been Food and Drug Administration (FDA) cleared or  approved and has been authorized by FDA under an Emergency Use  Authorization (EUA). The test is only authorized for the duration of  the declaration that circumstances exist justifying the authorization  of emergency use of in vitro diagnostic tests for detection and or  diagnosis of SARS CoV 2 under Section 564(b)(1) of the Act, 21 U.S.C.  section [id number] 3(b)(1), unless the authorization is terminated or   revoked sooner. [facility] is certified under the  Clinical Laboratory Improvement Amendments of 1988 (CLIA), 42 U.S.C.  section [phone], to perform high complexity tests. Performed at [id number] [address], [address], [place], [place] [address] Laboratory Director: Loleta Books, MD Performed at [facility], [place] [address] [place], [place] [address]    Coronavirus Source NASOPHARYNGEAL  Final    Comment: Performed at [facility], [address] [place], [place] [address]  MRSA PCR Screening     Status: None   Collection Time: 04/20/18  7:04 PM  Result Value Ref Range Status   MRSA by PCR NEGATIVE NEGATIVE Final    Comment:        The GeneXpert MRSA Assay (FDA approved for NASAL specimens only), is one component  of a comprehensive MRSA colonization surveillance program. It is not intended to diagnose MRSA infection nor to guide or monitor treatment for MRSA infections. Performed at [facility], [address] [place], [place] [address]   Culture, blood (Routine X 2) w Reflex to ID Panel     Status: None   Collection Time: 04/23/18  2:53 AM  Result Value Ref Range Status   Specimen Description BLOOD RIGHT HAND  Final   Special Requests   Final    BOTTLES DRAWN AEROBIC AND ANAEROBIC Blood Culture results may not be optimal due to an excessive volume of blood received in culture bottles   Culture   Final    NO GROWTH 5 DAYS Performed at [facility], [address], [place], [place] [address]    Report Status 04/28/2018 FINAL  Final  Culture, blood (Routine X 2) w Reflex to ID Panel     Status: None   Collection Time: 04/23/18  2:55 AM  Result Value Ref Range Status   Specimen Description BLOOD RIGHT WRIST  Final   Special Requests   Final    BOTTLES DRAWN AEROBIC AND ANAEROBIC Blood Culture adequate volume   Culture   Final    NO GROWTH 5 DAYS Performed at [facility], [address] [place], [place] [address]    Report Status 04/28/2018 FINAL  Final  SARS Coronavirus 2 [facility] order, Performed in [facility] hospital lab)     Status: None   Collection Time: 04/29/2018  4:28 AM  Result Value Ref Range Status   SARS Coronavirus 2 NEGATIVE NEGATIVE Final    Comment: (NOTE) If result is NEGATIVE SARS-CoV-2 target nucleic acids are NOT DETECTED. The SARS-CoV-2 RNA is generally detectable in upper and lower  respiratory specimens during the acute phase of infection. The lowest  concentration of SARS-CoV-2 viral copies this assay can detect is 250  copies / mL. A negative result does not preclude SARS-CoV-2 infection  and should not be used as the sole basis for treatment or other  patient management decisions.  A negative result may occur with  improper specimen collection / handling, submission of specimen other  than nasopharyngeal swab, presence of viral mutation(s) within the  areas targeted by this assay, and inadequate number of viral copies  (<250 copies / mL). A negative result must be combined with clinical  observations, patient history, and epidemiological information. If result is POSITIVE SARS-CoV-2 target nucleic acids are DETECTED. The SARS-CoV-2 RNA is generally detectable in upper and lower  respiratory specimens dur ing the acute phase of infection.  Positive  results are indicative of active infection with SARS-CoV-2.  Clinical  correlation with patient history and other diagnostic information is  necessary to determine patient infection status.  Positive results do  not rule out bacterial infection or co-infection with other viruses. If result is PRESUMPTIVE POSTIVE SARS-CoV-2 nucleic acids MAY BE PRESENT.   A presumptive positive result was obtained on the submitted specimen  and confirmed on repeat testing.  While 2019 novel coronavirus  (SARS-CoV-2) nucleic acids may be present in the submitted sample  additional confirmatory  testing may be necessary for epidemiological  and / or clinical management purposes  to differentiate between  SARS-CoV-2 and other Sarbecovirus currently known to infect humans.  If clinically indicated additional testing with an alternate test  methodology [phone]) is advised. The SARS-CoV-2 RNA is generally  detectable in upper and lower respiratory sp ecimens during the acute  phase of infection. The expected result is Negative. Fact Sheet for Patients:  StrictlyIdeas.no Fact Sheet for Healthcare Providers: BankingDealers.co.za This test is not yet approved or cleared by the [place] FDA and has been authorized for detection and/or diagnosis of SARS-CoV-2 by FDA under an Emergency Use Authorization (EUA).  This EUA will remain in effect (meaning this test can be used) for the duration of the COVID-19 declaration under Section 564(b)(1) of the Act, 21 U.S.C. section 360bbb-3(b)(1), unless the authorization is terminated or revoked sooner. Performed at [facility], [address] [place], [place] [address]   Blood Culture (routine x 2)     Status: None (Preliminary result)   Collection Time: 05/02/2018  4:51 AM  Result Value Ref Range Status   Specimen Description BLOOD RIGHT FOREARM  Final   Special Requests   Final    BOTTLES DRAWN AEROBIC AND ANAEROBIC Blood Culture results may not be optimal due to an excessive volume of blood received in culture bottles   Culture   Final    NO GROWTH < 12 HOURS Performed at [facility], [address], [place], [place] [address]    Report Status PENDING  Incomplete  Blood Culture (routine x 2)     Status: None (Preliminary result)   Collection Time: 05/06/2018  4:51 AM  Result Value Ref Range Status   Specimen Description BLOOD RIGHT HAND  Final   Special Requests   Final    BOTTLES DRAWN AEROBIC AND ANAEROBIC Blood Culture results may not be optimal due to an  inadequate volume of blood received in culture bottles   Culture   Final    NO GROWTH < 12 HOURS Performed at [facility], [address], [place], [place] [address]    Report Status PENDING  Incomplete    IMAGING: Dg Lumbar Spine 2-3 Views  Result Date: 04/26/2018 CLINICAL DATA:  Low back pain EXAM: LUMBAR SPINE - 2-3 VIEW COMPARISON:  04/25/2018 MRI FINDINGS: Changes consistent with L1 compression fracture are again noted. No new compression fractures are seen. Degenerative changes are noted similar to that seen on prior MRI. Diffuse aortic calcifications are seen. No soft tissue abnormality is noted. IMPRESSION: L1 compression fractures stable from the recent MRI. No other focal abnormality is noted. Electronically Signed   By: Inez Catalina M.D.   On: 04/26/2018 10:18   Mr Lumbar Spine W Wo Contrast  Result Date: 04/25/2018 CLINICAL DATA:  Initial evaluation for acute infection, osteomyelitis discitis. EXAM: MRI LUMBAR SPINE WITHOUT AND WITH CONTRAST TECHNIQUE: Multiplanar and multiecho pulse sequences of the lumbar spine were obtained without and with intravenous contrast. CONTRAST:  7 cc of Gadavist. COMPARISON:  Prior CT from 04/20/2018. FINDINGS: Segmentation: Standard. Lowest well-formed disc labeled the L5-S1 level. Alignment: 3 mm anterolisthesis of L4 on L5. Alignment otherwise normal with preservation of the normal lumbar lordosis. No listhesis or subluxation. Vertebrae: Severe compression deformity involving the L1 vertebral body, subacute in appearance with mild residual edema an enhancement. Associated advanced near complete height loss centrally with trace 4 mm bony retropulsion at the posterior/inferior aspect of the L1 vertebral body. Vertebral body heights otherwise maintained with no other evidence for acute or chronic fracture. Abnormal marrow edema and enhancement seen involving the L2 vertebral body, most notable on the left, compatible with acute osteomyelitis.  Extension into the left greater than right posterior elements of L2. Additional changes consistent with osteomyelitis within the left pedicle of L3. Probable associated septic arthritis involving  the intervening L2-3 facet. Edema and enhancement within the L1 vertebral body could be related to fracture and/or infection. Probable subtle changes of mild osteomyelitis at the inferior endplate of O17 as well. Fluid density within the T12-L1 and L1-2 interspace is suspicious for associated discitis. Possible subtle changes at L2-3 as well. Associated epidural phlegmon/abscess involving the central and left ventral epidural space extending from L2-3 through L4-5 (series 11, image 9). Additional epidural phlegmon/abscess seen at the right dorsal epidural space at the level of L2-3 (series 14, image 9). Mild epidural phlegmon extends cephalad to the level of approximately T12 (series 11, image 8). Conus medullaris and cauda equina: Conus extends to the L1 level. Thin leptomeningeal enhancement about the conus medullaris, likely infectious in nature. Paraspinal and other soft tissues: Extensive paraspinous edema within the bilateral psoas and left posterior paraspinous musculature. Superimposed multifocal soft tissue abscesses are seen. Largest of these within the left posterior paraspinous musculature measures approximately 2.6 x 3.0 cm (series 15, image 18). Largest abscess involving the left psoas muscle measures approximately 12 mm and is seen at the level of L2-3 (series 15, image 18). Largest involving the right psoas muscle measures 14 x 12 mm and is also seen at the level of L2-3 (series 15, image 18). Involvement of the left crus of diaphragm with associated 13 mm abscess noted (series 15, image 4), similar to prior CT. Multifocal cortical thinning and scarring noted within the right kidney. Scattered superimposed renal cysts noted bilaterally. Visualized visceral structures otherwise unremarkable. Disc levels:  T12-L1: Mild disc bulge. Epidural phlegmon/abscess at the posterior epidural space. No significant stenosis. L1-2: L1 compression deformity with up to 4 mm bony retropulsion. Associated diffuse disc bulge with intervertebral disc space narrowing. Mild bilateral facet hypertrophy. Superimposed epidural phlegmon/abscess, most notable at the right posterolateral epidural space. Resultant mild spinal stenosis. Mild bilateral L1 foraminal narrowing. L2-3: Minimal disc bulge. Mild to moderate facet hypertrophy. Epidural phlegmon/abscess, extending into the left L2-3 neural foramen. Central canal remains patent. Mild left foraminal stenosis. L3-4: Diffuse disc bulge with intervertebral disc space narrowing. Moderate facet hypertrophy. Epidural phlegmon/abscess within the central/left ventral epidural space. Resultant moderate canal with severe left lateral recess stenosis. Mild left foraminal narrowing. L4-5: Anterolisthesis. Diffuse disc bulge with disc desiccation. Epidural phlegmon/abscess involves the ventral epidural space, more notable on the left. Moderate facet arthrosis. Resultant moderate canal with left greater than right lateral recess stenosis. Foramina remain patent. L5-S1: Mild diffuse disc bulge. Mild facet hypertrophy. Resultant mild left lateral recess narrowing. Central canal remains patent. No significant foraminal encroachment. IMPRESSION: 1. Findings consistent with acute osteomyelitis involving the L2 and L3 vertebral bodies with probable septic arthritis involving the left L2-3 facet. Mild marrow edema involving the T12 and L1 vertebral bodies concerning for early/mild involvement as well. Suspected early changes of discitis in the intervening T12-L1 through L2-3 interspaces. 2. Associated epidural phlegmon/abscess extending from T12-L1 through L4-5 as above. Changes most pronounced at the L3-4 level were there is resultant moderate canal with severe left lateral recess stenosis. 3. Associated  paraspinous phlegmon with soft tissue abscesses involving the bilateral psoas musculature, left crus of diaphragm, and left posterior paraspinous musculature as above. 4. L1 compression deformity with advanced height loss and up to 4 mm bony retropulsion, subacute in appearance. Electronically Signed   By: Jeannine Boga M.D.   On: 04/25/2018 18:29   Dg Chest Port 1 View  Result Date: 05/07/2018 CLINICAL DATA:  Shortness of breath. Personal history of COPD. No  Mon yeah. EXAM: PORTABLE CHEST 1 VIEW COMPARISON:  One-view chest x-ray 04/22/2018 FINDINGS: Heart size is mildly enlarged. Atherosclerotic changes are again noted at the aortic arch. A diffuse interstitial pattern is superimposed on chronic disease. Bilateral pleural effusions are present. Bibasilar airspace disease likely reflects atelectasis. No other significant airspace consolidation is present. IMPRESSION: 1. Cardiomegaly with a diffuse interstitial pattern superimposed on chronic COPD. Findings are most concerning for edema and congestive heart failure. 2. Bilateral pleural effusions are present. 3. Bibasilar airspace disease likely reflects atelectasis. Infection is not excluded. Electronically Signed   By: San Morelle M.D.   On: 05/06/2018 06:01   Dg Chest Port 1 View  Result Date: 04/22/2018 CLINICAL DATA:  Acute respiratory failure.  History of CHF and COPD. EXAM: PORTABLE CHEST 1 VIEW COMPARISON:  Chest radiograph April 20, 2018 FINDINGS: Cardiac silhouette is mildly enlarged. Coronary artery stent. Calcified aortic arch. Worsening retrocardiac airspace opacity with small LEFT pleural effusion. Persistent small LEFT upper lobe nodular density. New bandlike density RIGHT lung base. Mild chronic interstitial changes. No pneumothorax. Soft tissue planes and included osseous structures are non suspicious. Multiple EKG lines overlie the patient and may obscure subtle underlying pathology. IMPRESSION: 1. Worsening retrocardiac  consolidation with small pleural effusion. New RIGHT lung base atelectasis, less likely pneumonia. Followup PA and lateral chest X-ray is recommended in 3-4 weeks following trial of antibiotic therapy to ensure resolution and exclude underlying malignancy. 2. Stable nodular density LEFT upper lobe. 3. Mild cardiomegaly. Electronically Signed   By: Elon Alas M.D.   On: 04/22/2018 03:58   Dg Chest Port 1 View  Result Date: 04/20/2018 CLINICAL DATA:  Fever, multiple complaint EXAM: PORTABLE CHEST 1 VIEW COMPARISON:  01/14/2013 FINDINGS: Mild bilateral interstitial thickening. Hazy left lower lobe airspace disease. Small area of airspace disease in the left upper lobe. Possible small left pleural effusion. No right pleural effusion. No pneumothorax. Stable cardiomediastinal silhouette. Thoracic aortic atherosclerosis. No acute osseous abnormality. IMPRESSION: 1. Hazy left lower lobe airspace disease. Small area of airspace disease in the left upper lobe. Findings are concerning for an infectious etiology. Electronically Signed   By: Kathreen Devoid   On: 04/20/2018 15:08   Dg Foot Complete Right  Result Date: 04/25/2018 CLINICAL DATA:  Foot pain.  Neuritis.  Assess for osteomyelitis. EXAM: RIGHT FOOT COMPLETE - 3+ VIEW COMPARISON:  None. FINDINGS: Dressing artifact overlies the great toe. I do not see evidence of fracture or dislocation. No sign of osteomyelitis. Detail the distal tuft is poor because of the overlying artifact. IMPRESSION: Overlying artifact at the great toe. No sign of fracture or osteomyelitis. Electronically Signed   By: Nelson Chimes M.D.   On: 04/25/2018 19:46   Ct Renal Stone Study  Result Date: 04/20/2018 CLINICAL DATA:  58 year old female with abdominal pain EXAM: CT ABDOMEN AND PELVIS WITHOUT CONTRAST TECHNIQUE: Multidetector CT imaging of the abdomen and pelvis was performed following the standard protocol without IV contrast. COMPARISON:  None. FINDINGS: Lower chest: Respiratory  motion limits evaluation. Atelectasis at the left lung base. Hepatobiliary: Cranial caudal span of the right liver measures greater than 18 cm. No focal lesion or nodular contour. Cholecystectomy. No intrahepatic or extrahepatic biliary ductal dilatation. Pancreas: Unremarkable pancreas Spleen: Unremarkable spleen Adrenals/Urinary Tract: Unremarkable adrenal glands. Right kidney demonstrates regions of cortical thinning. No hydronephrosis. Vascular calcifications in the hilum of the right kidney. Unremarkable course of the right ureter. Left kidney demonstrates no hydronephrosis. Likely nonobstructive stone in the inferior collecting system, versus  vascular calcifications. Unremarkable course the left ureter. Urinary bladder partially distended. Stomach/Bowel: Unremarkable stomach. Unremarkable small bowel. Moderate to large formed stool burden. No focal inflammatory changes. Normal appendix. Vascular/Lymphatic: Vascular calcifications of the aorta and bilateral iliac arteries. Venous collaterals on the anterior pelvic wall favored to reflect chronic left iliac main stenosis/occlusion. Reproductive: Unremarkable appearance of uterus. Other: Hazy edema/infiltration of the musculature associated with the left eye fragment a cruise. There are small gas locules present. This is in continuity with the anterior aspect of the L1-L2 disc space, which appears relatively widened compared to adjacent disc spaces. Musculoskeletal: Vacuum disc phenomenon at T12-L1. Compression fracture of L1. Irregularity of the inferior endplate with questionable disc space widening of L1-L2. Ill-defined soft tissue extends from the anterior aspect of the L1-L2 disc space into the left eye frag medic cruise. Degenerative changes of the lower lumbar spine. Vacuum disc phenomenon at L4-L5. IMPRESSION: Soft tissue changes of the left diaphragmatic cruz with small gas locules concerning for infection with gas-forming organism. This is in continuity  with the anterior aspect of the L1-L2 disc space, and the source may be secondary to discitis/osteomyelitis at this site. Evaluation with MRI with contrast may be useful. Compression fracture of L1 of indeterminate age. These results were discussed by telephone at the time of interpretation on 04/20/2018 at 4:10 pm with Dr. Mariea Clonts. Aortic Atherosclerosis (ICD10-I70.0). 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 Correlation with a prior history of DVT may be useful. Hepatomegaly. Electronically Signed   By: Corrie Mckusick D.O.   On: 04/20/2018 16:14   [place] Ekg Site Rite  Result Date: 04/27/2018 If Site Rite image not attached, placement could not be confirmed due to current cardiac rhythm.  [place] Ekg Site Rite  Result Date: 04/21/2018 If Site Rite image not attached, placement could not be confirmed due to current cardiac rhythm.   Assessment:   QUITA MCGRORY is a 58 y.o. 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 CXR with interstitial CHF pattern on top of chronic COPD and possible new lung infiltrates.  Neg COVID, flu.  [place] ngtd. She has an impressive rash which has a erythema multiforme appearance and is likely due to the cefazolin.  She has no oral lesions I have noted.   Recommendations Dc ceftrixone and cefepime to avoid beta lactams. Cont vanco for the MSSA bacteremia.  Levofloxacin for CAP type picture as well.  Continue management of COPD and CHF exacerbation per pulmonary.   Thank you very much for allowing me to participate in the care of this patient. Please call with questions.   Cheral Marker. Ola Spurr, MD

## 2018-05-16 NOTE — ED Provider Notes (Signed)
[facility] Emergency Department Provider Note  ____________________________________________   First MD Initiated Contact with Patient 05/17/2018 [phone]     (approximate)  I have reviewed the triage vital signs and the nursing notes.   HISTORY  Chief Complaint Respiratory Distress  Level 5 caveat:  history/ROS limited by acute/critical illness  HPI Alexandra Goodman is a 58 y.o. female with medical history as listed below which are extensive and include COPD on 4 L of oxygen at baseline and a recent hospitalization for pneumonia, UTI, bacteremia, and L1-L2 discitis for which she is receiving outpatient IV antibiotics through PICC line.  She presents by EMS for gradually worsening shortness of breath over the course of the day and has become severe.  Nothing makes it better and any amount of exertion makes it worse.  The paramedics report that upon arrival at the patient's house, her oxygen saturation was 68%.  With a nonrebreather it improved to 100%.  They tried her on CPAP but she said it did not make her feel any better.  They also gave her a breathing treatment in route to the hospital and that also did not help.  She is retracting and tripoding slightly upon arrival and able to speak only in short sentences.  She denies chest pain, recent fever, recent sore throat, and states that she has been isolated in her house, only around her family, and that she tested negative for COVID-19 when she was in the hospital about 3 weeks ago.         Past Medical History:  Diagnosis Date   Asthma    CHF (congestive heart failure) (HCC)    Chronic pain    COPD (chronic obstructive pulmonary disease) (HCC)    Diabetes mellitus ([place])    Peripheral vascular disease ([place])    Stroke (cerebrum) 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 Authorizing Provider  albuterol (PROAIR HFA) 108 (90 Base) MCG/ACT inhaler Inhale 2 puffs into the lungs every 4 (four) hours as needed for wheezing. 04/04/18   [provider]  amiodarone (PACERONE) 200 MG tablet Take 1 tablet (200 mg total) by mouth 2 (two) times daily for 30 days. 04/27/18 21-Jun-2018  Saundra Shelling, MD  aspirin EC 81 MG EC tablet Take 1 tablet (81 mg total) by mouth daily for 30 days. 04/28/18 05/28/18  Saundra Shelling, MD  atorvastatin (LIPITOR) 80 MG tablet Take 80 mg by mouth Nightly. 12/06/17 12/06/18  [provider]  ceFAZolin (ANCEF) IVPB Inject 2 g into the vein every 8 (eight) hours. Indication:  MSSA bacteremia and lumbar discitis Last Day of Therapy:  06/04/2018 Labs - Once weekly (on Mon):  CBC/D and CMP, Labs - Every other week (on Mon):  ESR and CRP 04/27/18 06/04/18  Saundra Shelling, MD  clopidogrel (PLAVIX) 75 MG tablet Take 75 mg by mouth daily. 02/22/18   [provider]  famotidine (PEPCID) 40 MG tablet Take 40 mg by mouth every evening. 11/03/17 11/03/18  [provider]  FLUoxetine (PROZAC) 20 MG capsule Take 20 mg by mouth daily. 02/02/18   [provider]  fluticasone (FLONASE) 50 MCG/ACT nasal spray Place 1 spray into both nostrils daily. 07/30/15   [provider]  fluticasone-salmeterol (ADVAIR HFA) 230-21 MCG/ACT inhaler Inhale 2 puffs into the lungs 2 (two) times daily. 05/10/16   [provider]  furosemide (LASIX) 40 MG tablet Take 40-80 mg by mouth 2 (two) times daily. 80 mg every morning and 40 mg every evening 11/25/17   [provider]  Insulin Glargine  (LANTUS SOLOSTAR) 100 UNIT/ML Solostar Pen Inject 46 Units into the skin at bedtime. 02/21/18   [provider]  levothyroxine (SYNTHROID, LEVOTHROID) 88 MCG tablet Take 88 mcg by mouth daily. 02/16/18   [provider]  losartan (COZAAR) 100 MG tablet Take 100 mg by mouth daily. 01/10/18   [provider]  metFORMIN (GLUCOPHAGE) 1000 MG tablet Take 1,000 mg by mouth 2 (two) times daily. 01/13/18   [provider]  mirabegron ER (MYRBETRIQ) 25 MG TB24 tablet Take 25 mg by mouth daily. 11/03/17   [provider]  montelukast (SINGULAIR) 10 MG tablet Take 10 mg by mouth daily. 04/05/17   [provider]  naloxone [facility]) nasal spray 4 mg/0.1 mL Place 4 mg into the nose once. 04/23/16   [provider]  nitroGLYCERIN (NITROSTAT) 0.4 MG SL tablet Place 0.4 mg under the tongue as needed. 11/08/17   [provider]  oxyCODONE-acetaminophen (PERCOCET) 10-325 MG tablet Take 1 tablet by mouth every 6 (six) hours as needed for pain. 04/27/18   Saundra Shelling, MD  predniSONE (DELTASONE) 20 MG tablet Take 40 mg by mouth daily. 03/23/18   [provider]  senna-docusate (SENOKOT-S) 8.6-50 MG tablet Take 2 tablets by mouth daily. 06/08/17 06/08/18  [provider]  tiotropium (SPIRIVA HANDIHALER) 18 MCG inhalation capsule Place 1 capsule into inhaler and inhale daily. 10/29/16   [provider]  traZODone (DESYREL) 50 MG tablet Take 150 mg by mouth at bedtime. 08/30/17   [provider]  warfarin (COUMADIN) 5 MG tablet Take 7.5 mg by mouth daily. 02/02/18   [provider]    Allergies Skin protectants, misc.; Flagyl [metronidazole]; Gabapentin; Levetiracetam; Tape; Ace inhibitors; Ertapenem; and Sertraline hcl  Family History  Problem Relation Age of Onset   Heart disease Mother    Heart disease Father     Social History Social History   Tobacco Use   Smoking status: Current Every Day Smoker     Packs/day: 2.00    Years: 45.00    Pack years: 90.00    Types: Cigarettes   Smokeless tobacco: Never Used  Substance Use Topics   Alcohol use: Not Currently   Drug use: Never    Review of Systems Insert level 5 caveat.  No chest pain, no fever, severe shortness of breath as described above. ____________________________________________   PHYSICAL EXAM:  VITAL SIGNS: ED Triage Vitals  Enc Vitals Group     BP 05/09/2018 0423 106/75     Pulse Rate 05/15/2018 0423 (!) 120     Resp 05/03/2018 0423 (!) 26     Temp --      Temp src --      SpO2 05/18/2018 0423 97 %     Weight 04/30/2018 0424 66 kg (145 lb 8.1 oz)     Height --      Head Circumference --      Peak Flow --  Pain Score 05/13/2018 0425 7     Pain Loc --      Pain Edu? --      Excl. in [place]? --     Constitutional: Alert and oriented but in severe respiratory distress. Eyes: Conjunctivae are normal.  Head: Atraumatic. Nose: No congestion/rhinnorhea. Neck: No stridor.  No meningeal signs.   Cardiovascular: Tachycardia in the 120s, regular rhythm. Good peripheral circulation. Grossly normal heart sounds. Respiratory: Increased respiratory effort with intercostal muscle retractions and accessory muscle usage.  Minimal air movement throughout with some expiratory wheezing.  No coarse breath sounds appreciated upon auscultation. Gastrointestinal: Soft and nontender. No distention.  Musculoskeletal: Left lower extremity is surgically absent below the knee.  There is trace pitting edema in the right lower extremity. Neurologic:  Normal speech and language although she is speaking in short phrases due to her respiratory distress.. No gross focal neurologic deficits are appreciated.  Skin:  Skin is warm, dry and intact.    ____________________________________________   LABS (all labs ordered are listed, but only abnormal results are displayed)  Labs Reviewed  LACTIC ACID, PLASMA - Abnormal; Notable for the following  components:      Result Value   Lactic Acid, Venous 2.3 (*)    All other components within normal limits  CBC WITH DIFFERENTIAL/PLATELET - Abnormal; Notable for the following components:   WBC 14.7 (*)    RBC 3.69 (*)    Hemoglobin 9.6 (*)    HCT 33.1 (*)    MCHC 29.0 (*)    RDW 24.0 (*)    Platelets 534 (*)    Neutro Abs 13.1 (*)    Abs Immature Granulocytes 0.09 (*)    All other components within normal limits  COMPREHENSIVE METABOLIC PANEL - Abnormal; Notable for the following components:   Chloride 97 (*)    Glucose, Bld 190 (*)    BUN 31 (*)    Creatinine, Ser 1.02 (*)    Calcium 8.6 (*)    Albumin 2.4 (*)    Alkaline Phosphatase 138 (*)    All other components within normal limits  FIBRIN DERIVATIVES D-DIMER ([facility] ONLY) - Abnormal; Notable for the following components:   Fibrin derivatives D-dimer [facility]) 8,101.75 (*)    All other components within normal limits  FIBRINOGEN - Abnormal; Notable for the following components:   Fibrinogen 672 (*)    All other components within normal limits  TROPONIN I - Abnormal; Notable for the following components:   Troponin I 0.31 (*)    All other components within normal limits  BRAIN NATRIURETIC PEPTIDE - Abnormal; Notable for the following components:   B Natriuretic Peptide 852.0 (*)    All other components within normal limits  MAGNESIUM - Abnormal; Notable for the following components:   Magnesium 1.6 (*)    All other components within normal limits  BLOOD GAS, VENOUS - Abnormal; Notable for the following components:   pO2, Ven 87.0 (*)    Bicarbonate 30.4 (*)    Acid-Base Excess 2.8 (*)    All other components within normal limits  PROTIME-INR - Abnormal; Notable for the following components:   Prothrombin Time 21.7 (*)    INR 1.9 (*)    All other components within normal limits  SARS CORONAVIRUS 2 (HOSPITAL ORDER, [place] [facility])  CULTURE, BLOOD (ROUTINE X 2)  CULTURE, BLOOD (ROUTINE X 2)  URINE  CULTURE  LACTATE DEHYDROGENASE  FERRITIN  LACTIC ACID, PLASMA  PROCALCITONIN  TRIGLYCERIDES  C-REACTIVE PROTEIN  URINALYSIS, ROUTINE W REFLEX MICROSCOPIC  POC URINE PREG, ED   ____________________________________________  EKG  ED ECG REPORT I, Hinda Kehr, the attending physician, personally viewed and interpreted this ECG.  Date: 05/18/2018 EKG Time: 4:25 AM Rate: 120 Rhythm: Sinus tachycardia QRS Axis: normal Intervals: normal ST/T Wave abnormalities: Significant ST depression in the lateral leads with some depression in the inferior leads as well and very slight elevation in aVR.  Very concerning for acute ischemia but does not meet criteria for STEMI. Narrative Interpretation: Probable demand ischemia in the setting respiratory failure but does not meet STEMI criteria.   ____________________________________________  RADIOLOGY I, Hinda Kehr, personally viewed and evaluated these images (plain radiographs) as part of my medical decision making, as well as reviewing the written report by the radiologist.  ED MD interpretation:  Diffuse infiltrates, infection vs edema, with COPD  Official radiology report(s): Dg Chest Port 1 View  Result Date: 04/20/2018 CLINICAL DATA:  Shortness of breath. Personal history of COPD. No Mon yeah. EXAM: PORTABLE CHEST 1 VIEW COMPARISON:  One-view chest x-ray 04/22/2018 FINDINGS: Heart size is mildly enlarged. Atherosclerotic changes are again noted at the aortic arch. A diffuse interstitial pattern is superimposed on chronic disease. Bilateral pleural effusions are present. Bibasilar airspace disease likely reflects atelectasis. No other significant airspace consolidation is present. IMPRESSION: 1. Cardiomegaly with a diffuse interstitial pattern superimposed on chronic COPD. Findings are most concerning for edema and congestive heart failure. 2. Bilateral pleural effusions are present. 3. Bibasilar airspace disease likely reflects atelectasis.  Infection is not excluded. Electronically Signed   By: San Morelle M.D.   On: 05/03/2018 06:01    ____________________________________________   PROCEDURES   Procedure(s) performed (including Critical Care):  .Critical Care Performed by: Hinda Kehr, MD Authorized by: Hinda Kehr, MD   Critical care provider statement:    Critical care time (minutes):  45   Critical care time was exclusive of:  Separately billable procedures and treating other patients   Critical care was necessary to treat or prevent imminent or life-threatening deterioration of the following conditions:  Respiratory failure   Critical care was time spent personally by me on the following activities:  Development of treatment plan with patient or surrogate, discussions with consultants, evaluation of patient's response to treatment, examination of patient, obtaining history from patient or surrogate, ordering and performing treatments and interventions, ordering and review of laboratory studies, ordering and review of radiographic studies, pulse oximetry, re-evaluation of patient's condition and review of old charts     ____________________________________________   [place] / MDM / [place] / ED COURSE  As part of my medical decision making, I reviewed the following data within the [place] notes reviewed and incorporated, Labs reviewed , EKG interpreted , Old EKG reviewed, Old chart reviewed, Radiograph reviewed , Discussed with admitting physician Gardiner Barefoot and Dr. Eugenie Norrie) and Notes from prior ED visits      Alexandra Goodman was evaluated in Emergency Department on 05/10/2018 for the symptoms described in the history of present illness. She was evaluated in the context of the global COVID-19 pandemic, which necessitated consideration that the patient might be at risk for infection with the SARS-CoV-2 virus that causes COVID-19. Institutional  protocols and algorithms that pertain to the evaluation of patients at risk for COVID-19 are in a state of rapid change based on information released by regulatory bodies including the CDC and federal and state organizations. These policies  and algorithms were followed during the patient's care in the ED.  Differential diagnosis includes, but is not limited to, COPD exacerbation, healthcare associated pneumonia, COVID-19, CHF exacerbation, ACS, PE.  The patient is in severe respiratory distress upon arrival.  Although this is almost certainly a COPD exacerbation, she has so many comorbidities including recent hospitalization that the differential is very broad.  I have verified in the computer that she had a send out coronavirus test 3 weeks ago that was negative but I will immediately send off a rapid in-house test tonight given that she could have acquired the disease since her hospitalization.  The patient is still awake and alert and has the capacity to make her own decisions.  I suggested emergent intubation given the amount of respiratory distress and her comorbidities, but she would strongly prefer to try BiPAP and nebulizer treatments first.  Given that I think she is low risk for COVID-19 I am proceeding with BiPAP and 3 duo nebs as well as Solu-Medrol 125 mg IV and magnesium 2 g IV.  A nasopharyngeal swab is Artie been sent to the lab.  I obtained the rest of the lab work as per the COVID-19 preadmission order set which includes sepsis related test such as lactic acid (which anticipate will be elevated due to respiratory failure) and procalcitonin.  No indication for emergent antibiotics at this time particular given that the patient is on antibiotics as an outpatient for her bacteremia.  I am resending cultures.  Patient will require frequent reassessment to determine if she needs to be intubated.  Clinical Course as of May 15 644  Mon May 16, 2018  0448 Of note, she was originally going to be  placed in a regular room but I diverted her to a negative pressure room and staff is maintaining appropriate precautions.   [CF]  0448 Mild nonspecific leukocytosis of 14.7, otherwise unremarkable.   [CF]  0502 Reasonable CO2, not severe hypercapnia  pCO2, Ven: 59 [CF]  1610 The patient is now hypotensive with a blood pressure of about 78/53 which I think is likely due to the positive pressure ventilation from the BiPAP.  However from a respiratory perspective she is doing better.  She is not lethargic, she responds immediately to any voice or physical stimuli, and she says that she feels better on the mask than she did previously.  Her air movement is a little bit better.  She is getting inline breathing treatments.  She is getting a liter fluid bolus to help with intravascular repletion in the setting of positive pressure ventilation and insensible losses over the course of the day.   [CF]  9604 Significantly elevated, but this does not indicate PE and is likely multifactorial in the setting of acute respiratory failure, recent and ongoing treatment for pneumonia, bacteremia, and discitis, etc.  Fibrin derivatives D-dimer [facility])(!): [id number] [CF]  0525 Elevated troponin likely represents demand ischemia.  Patient still having no chest pain.  Also, I checked her medical records and her troponin on 4/4 (about 3 weeks ago) was 22.8  Troponin I(!!): 0.31 [CF]  0530 The patient is noted to have a lactate>4. With the current information available to me, I don't think the patient is in septic shock. The lactate>2, is related to respiratory distress/ respiratory failure.  However, given her ongoing antibiotic treatment for multiple diagnoses (PNA, discitis, and bacteremia), I am going to give broad spectrum antibiotics (cefepime 2g IV and vancomycin 1 g IV).  Lactic Acid, Venous(!!):  2.3 [CF]  0533 .   [CF]  0540 Negative COVID-19.  I M awaiting the results of the chest x-ray and then I will contact the  hospitalist for admission.  SARS Coronavirus 2: NEGATIVE [CF]  0543 Normal renal function  Creatinine(!): 1.02 [CF]  0544 patient is supposed to be taking warfarin  INR(!): 1.9 [CF]  [id number] Radiologist brought up the question of possible CHF exacerbation but I do not think this fits clinically.  The patient is still feeling better and her blood pressure is now up to 91/60.  She feels better than she did previously.I discussed the case by phone with Gardiner Barefoot, NP with the hospitalist service.  We discussed the case in detail including the empiric broad-spectrum antibiotics and my concerns for COPD, negative COVID-19, questionable chest x-ray read, etc.  She understands and agrees with the plan and will see the patient for admission to the ICU.  DG Chest Port 1 View [CF]    Clinical Course User Index [CF] Hinda Kehr, MD     ____________________________________________  FINAL CLINICAL IMPRESSION(S) / ED DIAGNOSES  Final diagnoses:  Acute respiratory failure with hypoxia and hypercapnia (HCC)  COPD exacerbation (HCC)  Elevated lactic acid level  Elevated troponin  Demand ischemia (HCC)  Elevated d-dimer     MEDICATIONS GIVEN DURING THIS VISIT:  Medications  vancomycin (VANCOCIN) 500 mg in sodium chloride 0.9 % 100 mL IVPB (has no administration in time range)  vancomycin (VANCOCIN) IVPB 1000 mg/200 mL premix (1,000 mg Intravenous Not Given 05/08/2018 0636)  ipratropium-albuterol (DUONEB) 0.5-2.5 (3) MG/3ML nebulizer solution 3 mL (3 mLs Nebulization Given 05/07/2018 0504)  ipratropium-albuterol (DUONEB) 0.5-2.5 (3) MG/3ML nebulizer solution 3 mL (3 mLs Nebulization Given 05/17/2018 0445)  ipratropium-albuterol (DUONEB) 0.5-2.5 (3) MG/3ML nebulizer solution 3 mL (3 mLs Nebulization Given 04/27/2018 0440)  magnesium sulfate IVPB 2 g 50 mL (0 g Intravenous Stopped 05/15/2018 0540)  methylPREDNISolone sodium succinate (SOLU-MEDROL) 125 mg/2 mL injection 125 mg (125 mg Intravenous Given 05/07/2018  0430)  ipratropium-albuterol (DUONEB) 0.5-2.5 (3) MG/3ML nebulizer solution (3 mLs  Given 04/27/2018 0453)  sodium chloride 0.9 % bolus 1,000 mL (0 mLs Intravenous Stopped 05/04/2018 0545)  sodium chloride 0.9 % bolus 1,000 mL (1,000 mLs Intravenous New Bag/Given 04/23/2018 0546)  ceFEPIme (MAXIPIME) 2 g in sodium chloride 0.9 % 100 mL IVPB (0 g Intravenous Stopped 04/26/2018 9604)     ED Discharge Orders    None       Note:  This document was prepared using Dragon voice recognition software and may include unintentional dictation errors.   Hinda Kehr, MD 05/15/2018 [phone]

## 2018-05-16 NOTE — ED Notes (Signed)
Report to valerie, rn.  

## 2018-05-16 NOTE — Progress Notes (Signed)
PHARMACY CONSULT NOTE - FOLLOW UP  Pharmacy Consult for Electrolyte Monitoring and Replacement   Recent Labs: Potassium (mmol/L)  Date Value  05/03/2018 4.5  01/15/2013 4.2   Magnesium (mg/dL)  Date Value  95/63/8756 1.6 (L)  01/14/2013 1.8   Calcium (mg/dL)  Date Value  43/32/9518 8.6 (L)   Calcium, Total (mg/dL)  Date Value  84/16/6063 8.8   Albumin (g/dL)  Date Value  01/60/1093 2.4 (L)  01/14/2013 2.4 (L)   Phosphorus (mg/dL)  Date Value  23/55/7322 4.3   Sodium (mmol/L)  Date Value  05/01/2018 136  01/15/2013 137     Assessment: Patient admitted with respiratory failure/COPD exacerbation.  Goal of Therapy:  Potassium ~4.0, Magnesium ~2.0  Plan:  Potassium WNL. Patient received MG sulfate 2 g IV x 1 this AM, and ordered an additional 2g IV for this afternoon. Will follow-up with AM labs.  Pharmacy will continue to monitor.  Mauri Reading, PharmD Pharmacy Resident  04/29/2018 4:09 PM

## 2018-05-16 NOTE — ED Notes (Addendum)
ED TO INPATIENT HANDOFF REPORT  ED Nurse Name and Phone #: Wacey Zieger [phone]  S Name/Age/Gender Alexandra Goodman 58 y.o. female Room/Bed: ED01A/ED01A  Code Status   Code Status: Full Code  Home/SNF/Other Home Patient oriented to: self, place, time and situation Is this baseline? Yes   Triage Complete: Triage complete  Chief Complaint [facility] - COPD exacerbation  Triage Note EMS pt from home with c/o shortness of breath; says she just can't get any air in; pt with history of COPD; pt says she wears 4L via [place] but first responders weren't sure she was actually wearing it upon their arrival; sats were 68% when they arrived; NRB placed at home and sats up to 90's; pt arrives with c/o no air movement;    Allergies Allergies  Allergen Reactions  . Skin Protectants, Misc. Rash  . Flagyl [Metronidazole]   . Gabapentin   . Levetiracetam   . Tape Other (See Comments)    blisters  . Ace Inhibitors Rash  . Ertapenem Rash  . Sertraline Hcl Rash    Level of Care/Admitting Diagnosis ED Disposition    ED Disposition Condition Comment   Admit  Hospital Area: [facility] [[id number]]  Level of Care: Stepdown [14]  Covid Evaluation: N/A  Diagnosis: Acute exacerbation of chronic obstructive pulmonary disease (COPD) [facility]) [[id number]]  Admitting Physician: Hannah Beat [[id number]]  Attending Physician: Hannah Beat [[id number]]  Estimated length of stay: past midnight tomorrow  Certification:: I certify this patient will need inpatient services for at least 2 midnights  PT Class (Do Not Modify): Inpatient [101]  PT Acc Code (Do Not Modify): Private [1]       B Medical/Surgery History Past Medical History:  Diagnosis Date  . Asthma   . CHF (congestive heart failure) (HCC)   . Chronic pain   . COPD (chronic obstructive pulmonary disease) (HCC)   . Diabetes mellitus (HCC)   . Peripheral vascular disease (HCC)   . Stroke (cerebrum) [facility])    Past Surgical History:   Procedure Laterality Date  . below the knee LLE amputation Left      A IV Location/Drains/Wounds Patient Lines/Drains/Airways Status   Active Line/Drains/Airways    Name:   Placement date:   Placement time:   Site:   Days:   Peripheral IV 2018-06-12 Right Forearm   Jun 12, 2018    0450    Forearm   less than 1   Peripheral IV 06-12-18 Right Antecubital   06-12-2018    0514    Antecubital   less than 1   PICC Single Lumen 04/27/18 PICC Left Brachial 42 cm 0 cm   04/27/18    1300    Brachial   19   Pressure Injury 04/20/18 Stage II -  Partial thickness loss of dermis presenting as a shallow open ulcer with a red, pink wound bed without slough. great toe on right   04/20/18    2000     26   Pressure Injury 04/20/18 Stage II -  Partial thickness loss of dermis presenting as a shallow open ulcer with a red, pink wound bed without slough. pressure wound on right posterior leg and heel   04/20/18    2000     26   Pressure Injury 04/20/18 Stage II -  Partial thickness loss of dermis presenting as a shallow open ulcer with a red, pink wound bed without slough.   04/20/18    2000     26  Intake/Output Last 24 hours  Intake/Output Summary (Last 24 hours) at 05/15/2018 0701 Last data filed at 04/25/2018 1610 Gross per 24 hour  Intake 2250 ml  Output -  Net 2250 ml    Labs/Imaging Results for orders placed or performed during the hospital encounter of 05/10/2018 (from the past 48 hour(s))  SARS Coronavirus 2 [facility] order, Performed in [facility] hospital lab)     Status: None   Collection Time: 04/29/2018  4:28 AM  Result Value Ref Range   SARS Coronavirus 2 NEGATIVE NEGATIVE    Comment: (NOTE) If result is NEGATIVE SARS-CoV-2 target nucleic acids are NOT DETECTED. The SARS-CoV-2 RNA is generally detectable in upper and lower  respiratory specimens during the acute phase of infection. The lowest  concentration of SARS-CoV-2 viral copies this assay can detect is 250  copies / mL. A  negative result does not preclude SARS-CoV-2 infection  and should not be used as the sole basis for treatment or other  patient management decisions.  A negative result may occur with  improper specimen collection / handling, submission of specimen other  than nasopharyngeal swab, presence of viral mutation(s) within the  areas targeted by this assay, and inadequate number of viral copies  (<250 copies / mL). A negative result must be combined with clinical  observations, patient history, and epidemiological information. If result is POSITIVE SARS-CoV-2 target nucleic acids are DETECTED. The SARS-CoV-2 RNA is generally detectable in upper and lower  respiratory specimens dur ing the acute phase of infection.  Positive  results are indicative of active infection with SARS-CoV-2.  Clinical  correlation with patient history and other diagnostic information is  necessary to determine patient infection status.  Positive results do  not rule out bacterial infection or co-infection with other viruses. If result is PRESUMPTIVE POSTIVE SARS-CoV-2 nucleic acids MAY BE PRESENT.   A presumptive positive result was obtained on the submitted specimen  and confirmed on repeat testing.  While 2019 novel coronavirus  (SARS-CoV-2) nucleic acids may be present in the submitted sample  additional confirmatory testing may be necessary for epidemiological  and / or clinical management purposes  to differentiate between  SARS-CoV-2 and other Sarbecovirus currently known to infect humans.  If clinically indicated additional testing with an alternate test  methodology [phone]) is advised. The SARS-CoV-2 RNA is generally  detectable in upper and lower respiratory sp ecimens during the acute  phase of infection. The expected result is Negative. Fact Sheet for Patients:  BoilerBrush.com.cy Fact Sheet for Healthcare Providers: https://pope.com/ This test is not  yet approved or cleared by the [place] FDA and has been authorized for detection and/or diagnosis of SARS-CoV-2 by FDA under an Emergency Use Authorization (EUA).  This EUA will remain in effect (meaning this test can be used) for the duration of the COVID-19 declaration under Section 564(b)(1) of the Act, 21 U.S.C. section 360bbb-3(b)(1), unless the authorization is terminated or revoked sooner. Performed at [facility], [address] [place], [place] [address]   Lactic acid, plasma     Status: Abnormal   Collection Time: 04/30/2018  4:28 AM  Result Value Ref Range   Lactic Acid, Venous 2.3 (HH) 0.5 - 1.9 mmol/L    Comment: CRITICAL RESULT CALLED TO, READ BACK BY AND VERIFIED WITH Barnabas Harries RN 05/11/2018 @ 0524 RDW Performed at [facility], [address] [place], [place] [address]   CBC WITH DIFFERENTIAL     Status: Abnormal   Collection Time:  04/28/2018  4:28 AM  Result Value Ref Range   WBC 14.7 (H) 4.0 - 10.5 K/uL   RBC 3.69 (L) 3.87 - 5.11 MIL/uL   Hemoglobin 9.6 (L) 12.0 - 15.0 g/dL   HCT 16.133.1 (L) 09.636.0 - 04.546.0 %   MCV 89.7 80.0 - 100.0 fL   MCH 26.0 26.0 - 34.0 pg   MCHC 29.0 (L) 30.0 - 36.0 g/dL   RDW 40.924.0 (H) 81.111.5 - 91.415.5 %   Platelets 534 (H) 150 - 400 K/uL   nRBC 0.0 0.0 - 0.2 %   Neutrophils Relative % 86 %   Neutro Abs 13.1 (H) 1.7 - 7.7 K/uL   Lymphocytes Relative 8 %   Lymphs Abs 1.2 0.7 - 4.0 K/uL   Monocytes Relative 4 %   Monocytes Absolute 0.5 0.1 - 1.0 K/uL   Eosinophils Relative 1 %   Eosinophils Absolute 0.2 0.0 - 0.5 K/uL   Basophils Relative 0 %   Basophils Absolute 0.0 0.0 - 0.1 K/uL   WBC Morphology MORPHOLOGY UNREMARKABLE    Smear Review Normal platelet morphology    Immature Granulocytes 1 %   Abs Immature Granulocytes 0.09 (H) 0.00 - 0.07 K/uL   Polychromasia PRESENT     Comment: Performed at [facility], [address] [place], [place] [address]  Comprehensive metabolic panel     Status: Abnormal    Collection Time: 05/11/2018  4:28 AM  Result Value Ref Range   Sodium 136 135 - 145 mmol/L   Potassium 4.5 3.5 - 5.1 mmol/L   Chloride 97 (L) 98 - 111 mmol/L   CO2 26 22 - 32 mmol/L   Glucose, Bld 190 (H) 70 - 99 mg/dL   BUN 31 (H) 6 - 20 mg/dL   Creatinine, Ser 6.211.02 (H) 0.44 - 1.00 mg/dL   Calcium 8.6 (L) 8.9 - 10.3 mg/dL   Total Protein 6.5 6.5 - 8.1 g/dL   Albumin 2.4 (L) 3.5 - 5.0 g/dL   AST 16 15 - 41 U/L   ALT <5 0 - 44 U/L   Alkaline Phosphatase 138 (H) 38 - 126 U/L   Total Bilirubin 0.5 0.3 - 1.2 mg/dL   GFR calc non Af Amer >60 >60 mL/min   GFR calc Af Amer >60 >60 mL/min   Anion gap 13 5 - 15    Comment: Performed at [facility], [address] [place], [place] [address]  Fibrin derivatives D-Dimer     Status: Abnormal   Collection Time: 04/25/2018  4:28 AM  Result Value Ref Range   Fibrin derivatives D-dimer ([facility]) 2,149.32 (H) 0.00 - 499.00 ng/mL (FEU)    Comment: (NOTE) <> Exclusion of Venous Thromboembolism (VTE) - OUTPATIENT ONLY   (Emergency Department or [place])   0-499 ng/ml (FEU): With a low to intermediate pretest probability                      for VTE this test result excludes the diagnosis                      of VTE.   >499 ng/ml (FEU) : VTE not excluded; additional work up for VTE is                      required. <> Testing on Inpatients and Evaluation of Disseminated Intravascular   Coagulation (DIC) Reference Range:   0-499 ng/ml (FEU) Performed at [facility], [address] [place], [place] [address]   Lactate dehydrogenase     Status: None   Collection Time: 05/19/2018  4:28 AM  Result Value Ref Range   LDH 179 98 - 192 U/L    Comment: Performed at [facility], [address] [place], [place] [address]  Ferritin     Status: None   Collection Time: 05/05/2018  4:28 AM  Result Value Ref Range   Ferritin 44 11 - 307 ng/mL    Comment: Performed at [facility], [address] [place], [place] [address]   Fibrinogen     Status: Abnormal   Collection Time: 05/18/2018  4:28 AM  Result Value Ref Range   Fibrinogen 672 (H) 210 - 475 mg/dL    Comment: Performed at [facility], [address] [place], [place] [address]  Troponin I - Once     Status: Abnormal   Collection Time: 04/23/2018  4:28 AM  Result Value Ref Range   Troponin I 0.31 (HH) <0.03 ng/mL    Comment: CRITICAL RESULT CALLED TO, READ BACK BY AND VERIFIED WITH Barnabas Harries RN 05/08/2018 @ 0524 Performed at [facility], [address] [place], [place] [address]   Brain natriuretic peptide     Status: Abnormal   Collection Time: 04/25/2018  4:28 AM  Result Value Ref Range   B Natriuretic Peptide 852.0 (H) 0.0 - 100.0 pg/mL    Comment: Performed at [facility], [address], [place], [place] [address]  Magnesium     Status: Abnormal   Collection Time: 04/20/2018  4:28 AM  Result Value Ref Range   Magnesium 1.6 (L) 1.7 - 2.4 mg/dL    Comment: Performed at [facility], [address] [place], [place] [address]  Protime-INR     Status: Abnormal   Collection Time: 04/27/2018  4:28 AM  Result Value Ref Range   Prothrombin Time 21.7 (H) 11.4 - 15.2 seconds   INR 1.9 (H) 0.8 - 1.2    Comment: (NOTE) INR goal varies based on device and disease states. Performed at [facility], [address] [place], [place] [address]   Blood gas, venous     Status: Abnormal   Collection Time: 04/20/2018  4:51 AM  Result Value Ref Range   FIO2 1.00    pH, Ven 7.32 7.250 - 7.430   pCO2, Ven 59 44.0 - 60.0 mmHg   pO2, Ven 87.0 (H) 32.0 - 45.0 mmHg   Bicarbonate 30.4 (H) 20.0 - 28.0 mmol/L   Acid-Base Excess 2.8 (H) 0.0 - 2.0 mmol/L   O2 Saturation 95.8 %   Patient temperature 37.0    Collection site VENOUS    Drawn by VENOUS    Sample type VENOUS     Comment: Performed at [facility], [address] [place], [place] [address]  Lactic acid, plasma     Status: Abnormal   Collection  Time: 05/10/2018  6:26 AM  Result Value Ref Range   Lactic Acid, Venous 2.1 (HH) 0.5 - 1.9 mmol/L    Comment: CRITICAL VALUE NOTED. VALUE IS CONSISTENT WITH PREVIOUSLY REPORTED/CALLED VALUE RDW Performed at [facility], [address] [place] [place], [place] [address]    Dg Chest Shindler 1 View  Result Date: 05/04/2018 CLINICAL DATA:  Shortness of breath. Personal history of COPD. No Mon yeah. EXAM: PORTABLE CHEST 1 VIEW COMPARISON:  One-view chest x-ray 04/22/2018 FINDINGS: Heart size is mildly enlarged. Atherosclerotic changes are again  noted at the aortic arch. A diffuse interstitial pattern is superimposed on chronic disease. Bilateral pleural effusions are present. Bibasilar airspace disease likely reflects atelectasis. No other significant airspace consolidation is present. IMPRESSION: 1. Cardiomegaly with a diffuse interstitial pattern superimposed on chronic COPD. Findings are most concerning for edema and congestive heart failure. 2. Bilateral pleural effusions are present. 3. Bibasilar airspace disease likely reflects atelectasis. Infection is not excluded. Electronically Signed   By: Marin Roberts M.D.   On: 04/27/2018 06:01    Pending Labs Unresulted Labs (From admission, onward)    Start     Ordered   04/23/2018 0436  Urinalysis, Routine w reflex microscopic  ONCE - STAT,   STAT     05/01/2018 0435   04/25/2018 0436  Urine Culture  Add-on,   AD    Question:  Patient immune status  Answer:  Normal   05/03/2018 0435   05/15/2018 0429  Blood Culture (routine x 2)  BLOOD CULTURE X 2,   STAT     04/24/2018 0429   05/17/2018 0429  Procalcitonin  ONCE - STAT,   STAT     05/19/2018 0429   05/09/2018 0429  Triglycerides  Once,   STAT     04/24/2018 0429   05/06/2018 0429  C-reactive protein  Once,   STAT     05/03/2018 0429          Vitals/Pain Today's Vitals   05/13/2018 0610 05/14/2018 0615 05/08/2018 0620 04/24/2018 0630  BP: 94/68 (!) 95/59 (!) 90/59 (!) 97/58  Pulse: 99 99 100 (!) 101  Resp: Temp:      TempSrc:      SpO2: 99% 99% 100% 99%  Weight:      PainSc:        Isolation Precautions No active isolations  Medications Medications  vancomycin (VANCOCIN) 500 mg in sodium chloride 0.9 % 100 mL IVPB (500 mg Intravenous New Bag/Given 04/27/2018 0648)  vancomycin (VANCOCIN) IVPB 1000 mg/200 mL premix (1,000 mg Intravenous Not Given 05/04/2018 0636)  ipratropium-albuterol (DUONEB) 0.5-2.5 (3) MG/3ML nebulizer solution 3 mL (3 mLs Nebulization Given 05/03/2018 0504)  ipratropium-albuterol (DUONEB) 0.5-2.5 (3) MG/3ML nebulizer solution 3 mL (3 mLs Nebulization Given 05/04/2018 0445)  ipratropium-albuterol (DUONEB) 0.5-2.5 (3) MG/3ML nebulizer solution 3 mL (3 mLs Nebulization Given 05/15/2018 0440)  magnesium sulfate IVPB 2 g 50 mL (0 g Intravenous Stopped 04/23/2018 0540)  methylPREDNISolone sodium succinate (SOLU-MEDROL) 125 mg/2 mL injection 125 mg (125 mg Intravenous Given 05/05/2018 0430)  ipratropium-albuterol (DUONEB) 0.5-2.5 (3) MG/3ML nebulizer solution (3 mLs  Given 05/11/2018 0453)  sodium chloride 0.9 % bolus 1,000 mL (0 mLs Intravenous Stopped 05/19/2018 0545)  sodium chloride 0.9 % bolus 1,000 mL (0 mLs Intravenous Stopped 04/25/2018 0646)  ceFEPIme (MAXIPIME) 2 g in sodium chloride 0.9 % 100 mL IVPB (0 g Intravenous Stopped 05/04/2018 0616)    Mobility manual wheelchair High fall risk(pt on bipap)   Focused Assessments Pulmonary Assessment Handoff:  Lung sounds: Bilateral Breath Sounds: Clear, Diminished L Breath Sounds: Diminished R Breath Sounds: Diminished O2 Device: Bi-PAP O2 Flow Rate (L/min): 15 L/min      R Recommendations: See Admitting Provider Note  Report given to:   Additional Notes:  bipap 18:6 50%

## 2018-05-16 NOTE — Consult Note (Addendum)
ANTICOAGULATION CONSULT NOTE - Initial Consult  Pharmacy Consult for Warfarin Indication: Previous DVT  Allergies  Allergen Reactions  . Skin Protectants, Misc. Rash  . Flagyl [Metronidazole]   . Gabapentin   . Levetiracetam   . Tape Other (See Comments)    blisters  . Ace Inhibitors Rash  . Ertapenem Rash  . Sertraline Hcl Rash    Patient Measurements: Height: 5\' 3"  (160 cm) Weight: 158 lb 11.7 oz (72 kg) IBW/kg (Calculated) : 52.4   Vital Signs: Temp: 98 F (36.7 C) (04/27 0900) Temp Source: Axillary (04/27 0900) BP: 104/67 (04/27 0900) Pulse Rate: 95 (04/27 0900)  Labs: Recent Labs    04/28/2018 0428 05/05/2018 0735  HGB 9.6*  --   HCT 33.1*  --   PLT 534*  --   LABPROT 21.7*  --   INR 1.9*  --   CREATININE 1.02*  --   TROPONINI 0.31* 0.39*    Estimated Creatinine Clearance: 57.8 mL/min (A) (by C-G formula based on SCr of 1.02 mg/dL (H)).   Medical History: Past Medical History:  Diagnosis Date  . Asthma   . CHF (congestive heart failure) (HCC)   . Chronic pain   . COPD (chronic obstructive pulmonary disease) (HCC)   . Diabetes mellitus (HCC)   . Peripheral vascular disease (HCC)   . 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 Patient takes amiodarone 200 mg PO BID PTA (continued inpatient).  Assessment: 58 y.o. female admitted on 04/26/2018 with COPD exacerbation. Patient was recently discharged with pneumonia and received cefazolin via PICC at home.  Goal of Therapy:  INR 2-3 Monitor platelets by anticoagulation protocol: Yes   Plan:  4/27 INR 1.9, H&H WNL  Will order Warfarin 8.5 mg tonight (~15% increase) as INR slightly subtherapeutic. Will order INR and CBC with AM labs.  Pharmacy will continue to monitor.   Mauri Reading, PharmD Pharmacy Resident  04/24/2018 4:03 PM

## 2018-05-16 NOTE — Progress Notes (Addendum)
Attending physician admission note:  I have seen and examined the patient with my Nurse practitioner.  The patient presents with worsening shortness of breath with associated cough and wheezing.  She was significantly tight in the ER requiring BiPAP.  She was given nebulized bronchodilator therapy and IV steroids.  Lactic acid was 2.3.  She was given hydration with IV normal saline.  Her COVID-19 test was negative.  Upon physical examination:  Generally: Ill-looking middle-aged Caucasian female in moderate respiratory distress on BiPAP Cardiovascular: Regular rate and rhythm with normal S1-S2 and no murmurs gallops or rubs. Respiratory: Slightly mid bibasilar breath sounds with expiratory wheezes and prolonged expiratory airflow. Abdomen: Soft, nontender, nondistended with positive bowel sounds and no palpable megaly or masses. Extremities: She has left below-knee amputation and right ankle was dressed.  Labs and radiographic studies:  Assessment/plan: COPD acute exacerbation with subsequent acute respiratory failure on BiPAP. The patient will be admitted to medical monitored bed.  She will be placed on antibiotic therapy with IV Rocephin and Zithromax as well as mucolytic therapy and scheduled and as needed nebulized bronchodilator therapy.  We will continue BiPAP.  We will follow sputum culture.  We will check her BNP.  I discussed the case with our intensivist.  For further details please refer to dictated admission H&P.  I have discussed the case with my Nurse practitioner. I agree with the admission note and the rest of the plan of care as delineated by my nurse practitioner.

## 2018-05-16 NOTE — Consult Note (Signed)
CRITICAL CARE NOTE      CHIEF COMPLAINT:   Acute hypoxemic respiratory failure   HPI   This is a pleasant 58 year old female with a known history of COPD, CHF PAD, she is well-known to our service and has recently been hospitalized for pneumonia.  Most recent hospitalization this month she was treated for staph aureus bacteremia as well as discitis with lumbar osteomyelitis.  She was sent home with a peripherally placed central line on IV antibiotics.  She was admitted with chief complaint of shortness of breath over the past few days and reports severe breathlessness despite using her home oxygen.  She denies any sick contacts denies flulike symptoms, states she has not checked her temperature but subjectively denies fevers.  She does report that her baseline phlegm has become more dark in color and increased in volume as well as viscosity.  Upon arrival to the ED she was found to be in respiratory distress requiring noninvasive ventilation, she was tested for novel coronavirus which was negative.  In the ER she did receive resuscitative fluids with 2 L normal saline.  She was noted to have an elevated lactate at 2.3 and demand ischemia.  X-ray was done which shows bilateral interstitial opacities as well as atelectasis at the right base and possible infectious infiltrate. In the MICU on initial evaluation patient was noted to have punctate erythematous rash over torso and extermities. Daughter states this has developed over past week while in Cefazolin via PICC at home however has not complained of pruritic or painful sensation over involved areas.  Oxygen therapy is being weaned down to 30%.  Patient overall prognosis guarded with several hospital admission including multiple MICU admissions in past 1 year.   Discussed case  and care plan with daughter Georges MouseMelissa Blase today 340pm-answered all questions.   PAST MEDICAL HISTORY   Past Medical History:  Diagnosis Date  . Asthma   . CHF (congestive heart failure) (HCC)   . Chronic pain   . COPD (chronic obstructive pulmonary disease) (HCC)   . Diabetes mellitus (HCC)   . Peripheral vascular disease (HCC)   . 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 below the knee LLE amputation Left      FAMILY HISTORY   Family History  Problem Relation Age of Onset  . Heart disease Mother   . Heart disease Father      SOCIAL HISTORY   Social History   Tobacco Use  . Smoking status: Current Every Day Smoker    Packs/day: 2.00    Years: 45.00    Pack years: 90.00    Types: Cigarettes  . Smokeless tobacco: Never Used  Substance Use Topics  . Alcohol use: Not Currently  . Drug use: Never     MEDICATIONS   Current Medication:  Current Facility-Administered Medications:  .  albuterol (PROVENTIL) (2.5 MG/3ML) 0.083% nebulizer solution 2.5 mg, 2.5 mg, Nebulization, Q4H PRN, Seals, Angela H, NP .  amiodarone (PACERONE) tablet 200 mg, 200 mg, Oral, BID, Seals, Angela H, NP .  aspirin EC tablet 81 mg, 81 mg, Oral, Daily, Seals, Angela H, NP .  atorvastatin (LIPITOR) tablet 80 mg, 80 mg, Oral, QHS, Seals, Angela H, NP .  azithromycin (ZITHROMAX) tablet 500 mg, 500 mg, Oral, Q24H, Seals, Angela H, NP .  cefTRIAXone (ROCEPHIN) 1 g in sodium chloride 0.9 % 100 mL IVPB, 1 g, Intravenous, Q24H, Seals, [place]  H, NP .  clopidogrel (PLAVIX) tablet 75 mg, 75 mg, Oral, Daily, Seals, Angela H, NP .  enoxaparin (LOVENOX) injection 40 mg, 40 mg, Subcutaneous, Q24H, Maxcine [place], [place] .  famotidine (PEPCID) tablet 40 mg, 40 mg, Oral, QPM, Seals, Angela H, NP .  FLUoxetine (PROZAC) capsule 20 mg, 20 mg, Oral, Daily, Seals, Angela H, NP .  fluticasone (FLONASE) 50 MCG/ACT nasal spray 1 spray, 1 spray,  Each Nare, Daily, Seals, Angela H, NP .  guaiFENesin (MUCINEX) 12 hr tablet 600 mg, 600 mg, Oral, BID, Mansy, Jan A, MD .  ipratropium-albuterol (DUONEB) 0.5-2.5 (3) MG/3ML nebulizer solution 3 mL, 3 mL, Nebulization, Q6H, Seals, Angela H, NP, 3 mL at 2018/06/12 0930 .  levothyroxine (SYNTHROID) tablet 88 mcg, 88 mcg, Oral, Daily, Seals, Angela H, NP .  magnesium sulfate IVPB 2 g 50 mL, 2 g, Intravenous, Once, Seals, Angela H, NP .  metFORMIN (GLUCOPHAGE) tablet 1,000 mg, 1,000 mg, Oral, BID WC, Seals, Angela H, NP .  mirabegron ER (MYRBETRIQ) tablet 25 mg, 25 mg, Oral, Daily, Seals, Angela H, NP .  mometasone-formoterol (DULERA) 200-5 MCG/ACT inhaler 2 puff, 2 puff, Inhalation, BID, Seals, Angela H, NP .  montelukast (SINGULAIR) tablet 10 mg, 10 mg, Oral, Daily, Seals, Angela H, NP .  oxyCODONE-acetaminophen (PERCOCET/ROXICET) 5-325 MG per tablet 1 tablet, 1 tablet, Oral, Q8H PRN **AND** oxyCODONE (Oxy IR/ROXICODONE) immediate release tablet 5 mg, 5 mg, Oral, Q8H PRN, Seals, Angela H, NP .  tiotropium (SPIRIVA) inhalation capsule ([facility] use ONLY) 18 mcg, 1 capsule, Inhalation, Daily, Seals, Angela H, NP .  traZODone (DESYREL) tablet 150 mg, 150 mg, Oral, QHS, Seals, Angela H, NP .  vancomycin (VANCOCIN) IVPB 1000 mg/200 mL premix, 1,000 mg, Intravenous, Once, Mansy, Jan A, MD .  warfarin (COUMADIN) tablet 7.5 mg, 7.5 mg, Oral, q1800, Seals, Milas Kocher, NP .  Warfarin - Physician Dosing Inpatient, , Does not apply, q1800, Mansy, Vernetta Honey, MD    ALLERGIES   Skin protectants, misc.; Flagyl [metronidazole]; Gabapentin; Levetiracetam; Tape; Ace inhibitors; Ertapenem; and Sertraline hcl    REVIEW OF SYSTEMS     10 point ROS done and is negative except as per HPI  PHYSICAL EXAMINATION   Vitals:   Jun 12, 2018 0800 06/12/18 0900  BP: 106/65 104/67  Pulse: 95 95  Resp: 16 17  Temp:  98 F (36.7 C)  SpO2: 100% 100%    GENERAL:Mild distress due to acutely ill state HEAD: Normocephalic,  atraumatic.  EYES: Pupils equal, round, reactive to light.  No scleral icterus.  MOUTH: Moist mucosal membrane. NECK: Supple. No thyromegaly. No nodules. No JVD.  PULMONARY: Rhonchi bilaterally , decreased bs at bases CARDIOVASCULAR: S1 and S2. Regular rate and rhythm. No murmurs, rubs, or gallops.  GASTROINTESTINAL: Soft, nontender, non-distended. No masses. Positive bowel sounds. No hepatosplenomegaly.  MUSCULOSKELETAL: No swelling, clubbing, or edema.  NEUROLOGIC: Mild distress due to acute illness SKIN:intact,warm,dry   LABS AND IMAGING    LAB RESULTS: Recent Labs  Lab 12-Jun-2018 0428  NA 136  K 4.5  CL 97*  CO2 26  BUN 31*  CREATININE 1.02*  GLUCOSE 190*   Recent Labs  Lab 06/12/18 0428  HGB 9.6*  HCT 33.1*  WBC 14.7*  PLT 534*     IMAGING RESULTS: Dg Chest Port 1 View  Result Date: Jun 12, 2018 CLINICAL DATA:  Shortness of breath. Personal history of COPD. No Mon yeah. EXAM: PORTABLE CHEST 1 VIEW COMPARISON:  One-view chest x-ray 04/22/2018 FINDINGS: Heart size is mildly  enlarged. Atherosclerotic changes are again noted at the aortic arch. A diffuse interstitial pattern is superimposed on chronic disease. Bilateral pleural effusions are present. Bibasilar airspace disease likely reflects atelectasis. No other significant airspace consolidation is present. IMPRESSION: 1. Cardiomegaly with a diffuse interstitial pattern superimposed on chronic COPD. Findings are most concerning for edema and congestive heart failure. 2. Bilateral pleural effusions are present. 3. Bibasilar airspace disease likely reflects atelectasis. Infection is not excluded. Electronically Signed   By: Marin Roberts M.D.   On: Jun 07, 2018 06:01   04/25/2018 TTE  1. The left ventricle has normal systolic function with an ejection fraction of 60-65%. The cavity size was normal. Left ventricular diastolic parameters were normal.  2. The right ventricle has normal systolic function. The cavity was  normal. There is no increase in right ventricular wall thickness.  3. No evidence present in the left atrial appendage.  4. The aortic valve is tricuspid. Aortic valve regurgitation is trivial by color flow Doppler.  5. No pulmonic valve vegetation visualized.  4.3.2020   2018-06-07    ASSESSMENT AND PLAN    -Multidisciplinary rounds held today  Acute on chronic hypoxic Respiratory Failure        -Multifactorial in etiology 1. Acute decompensated diastolic CHF EF >65%             -Diuresing gently in view of hypertension and possible infectious pneumonia. 2.  Bibasilar atelectasis -aggressive bronchopulmonary hygiene, MetaNeb therapy twice daily, chest physiotherapy-discussed with respiratory therapist 3.  Possible community-acquired pneumonia- urine Legionella antigen sent, strep pneumo antigen pending, respiratory specimen pending, blood cultures pending.  CXR with new infiltrate compared to PA film from April 22, 2018.  Currently on empiric CAP regimen 4.  COPD-without grossly apparent exacerbation   -We will continue typical COPD care path with nebulizer therapy as well as flutter valve and bronchopulmonary hygiene, no need for systemic glucocorticoids at this time.     Maculopapular rash    -likely due to recent prolonged IV cefazolin vs plavix, latter being less likely due to chronicity and tempo    - ID consultation - appreciate input    CARDIAC FAILURE-         -As above acute decompensated diastolic CHF    -BNP elevated >301 -oxygen as needed -Lasix as tolerated -follow up cardiac enzymes as indicated ICU monitoring   Moderate protein calorie malnutrition    -Albumin 2.4    Dietary consultation   ID- hx of bacteremia and osteomyelitis    - microbiology pending  -continue IV abx as prescibed -follow up cultures   GI/Nutrition GI PROPHYLAXIS as indicated DIET-->TF's as tolerated Constipation protocol as indicated   ENDO - ICU hypoglycemic\Hyperglycemia  protocol -check FSBS per protocol   ELECTROLYTES -follow labs as needed -replace as needed -pharmacy consultation   DVT/GI PRX ordered -SCDs  TRANSFUSIONS AS NEEDED MONITOR FSBS ASSESS the need for LABS as needed   Critical care provider statement:    Critical care time (minutes):  35   Critical care time was exclusive of:  Separately billable procedures and treating other patients   Critical care was necessary to treat or prevent imminent or life-threatening deterioration of the following conditions:   Acute hypoxemic respiratory failure, severe COPD, hyperglycemia, moderate protein calorie malnutrition, multiple comorbid conditions   Critical care was time spent personally by me on the following activities:  Development of treatment plan with patient or surrogate, discussions with consultants, evaluation of patient's response to treatment, examination of patient, obtaining  history from patient or surrogate, ordering and performing treatments and interventions, ordering and review of laboratory studies and re-evaluation of patient's condition.  I assumed direction of critical care for this patient from another provider in my specialty: no    This document was prepared using Dragon voice recognition software and may include unintentional dictation errors.    Ottie Glazier, M.D.  Division of [facility]

## 2018-05-16 NOTE — ED Notes (Signed)
Patient open eyes when name called; shakes head yes when asked if feeling some better; MD aware pt still hypotensive; pt placed in trendelenburg at this time; sats 100% on bipap

## 2018-05-17 ENCOUNTER — Inpatient Hospital Stay: Payer: Medicare Other

## 2018-05-17 LAB — CBC
HCT: 30.6 % — ABNORMAL LOW (ref 36.0–46.0)
Hemoglobin: 9 g/dL — ABNORMAL LOW (ref 12.0–15.0)
MCH: 26.5 pg (ref 26.0–34.0)
MCHC: 29.4 g/dL — ABNORMAL LOW (ref 30.0–36.0)
MCV: 90 fL (ref 80.0–100.0)
Platelets: 524 10*3/uL — ABNORMAL HIGH (ref 150–400)
RBC: 3.4 MIL/uL — ABNORMAL LOW (ref 3.87–5.11)
RDW: 23.8 % — ABNORMAL HIGH (ref 11.5–15.5)
WBC: 8.7 10*3/uL (ref 4.0–10.5)
nRBC: 0 % (ref 0.0–0.2)

## 2018-05-17 LAB — BASIC METABOLIC PANEL
Anion gap: 7 (ref 5–15)
BUN: 35 mg/dL — ABNORMAL HIGH (ref 6–20)
CO2: 28 mmol/L (ref 22–32)
Calcium: 8.6 mg/dL — ABNORMAL LOW (ref 8.9–10.3)
Chloride: 102 mmol/L (ref 98–111)
Creatinine, Ser: 1.09 mg/dL — ABNORMAL HIGH (ref 0.44–1.00)
GFR calc Af Amer: >60 mL/min (ref >60)
GFR calc non Af Amer: 56 mL/min — ABNORMAL LOW (ref >60)
Glucose, Bld: 151 mg/dL — ABNORMAL HIGH (ref 70–99)
Potassium: 4.5 mmol/L (ref 3.5–5.1)
Sodium: 137 mmol/L (ref 135–145)

## 2018-05-17 LAB — STREP PNEUMONIAE URINARY ANTIGEN: Strep Pneumo Urinary Antigen: NEGATIVE

## 2018-05-17 LAB — GLUCOSE, CAPILLARY
Glucose-Capillary: 138 mg/dL — ABNORMAL HIGH (ref 70–99)
Glucose-Capillary: 116 mg/dL — ABNORMAL HIGH (ref 70–99)
Glucose-Capillary: 122 mg/dL — ABNORMAL HIGH (ref 70–99)
Glucose-Capillary: 83 mg/dL (ref 70–99)

## 2018-05-17 LAB — TROPONIN I
Troponin I: 0.86 ng/mL (ref <0.03)
Troponin I: 0.94 ng/mL (ref <0.03)
Troponin I: 0.76 ng/mL (ref <0.03)

## 2018-05-17 LAB — PROTIME-INR
INR: 2.6 — ABNORMAL HIGH (ref 0.8–1.2)
Prothrombin Time: 27.7 seconds — ABNORMAL HIGH (ref 11.4–15.2)

## 2018-05-17 MED ORDER — MORPHINE SULFATE (PF) 2 MG/ML IV SOLN
INTRAVENOUS | Status: AC
  Administered 2018-05-17: 17:00:00 1 mg via INTRAVENOUS
  Filled 2018-05-17: qty 1

## 2018-05-17 MED ORDER — DIPHENHYDRAMINE HCL 50 MG/ML IJ SOLN
25.0000 mg | Freq: Once | INTRAMUSCULAR | Status: AC
  Administered 2018-05-17: 25 mg via INTRAVENOUS

## 2018-05-17 MED ORDER — GLYCOPYRROLATE 0.2 MG/ML IJ SOLN: 0.1000 mg | Freq: Once | INTRAMUSCULAR | Status: DC

## 2018-05-17 MED ORDER — VANCOMYCIN HCL 10 G IV SOLR
1750.0000 mg | INTRAVENOUS | Status: DC
  Administered 2018-05-18: 1750 mg via INTRAVENOUS
  Filled 2018-05-17: qty 1750

## 2018-05-17 MED ORDER — FUROSEMIDE 10 MG/ML IJ SOLN
20.0000 mg | Freq: Once | INTRAMUSCULAR | Status: AC
  Administered 2018-05-17: 22:00:00 20 mg via INTRAVENOUS
  Filled 2018-05-17: qty 2

## 2018-05-17 MED ORDER — MORPHINE 100MG IN NS 100ML (1MG/ML) PREMIX INFUSION: 1.0000 mg/h | INTRAVENOUS | Status: DC

## 2018-05-17 MED ORDER — MORPHINE SULFATE (PF) 2 MG/ML IV SOLN
1.0000 mg | Freq: Once | INTRAVENOUS | Status: AC
  Administered 2018-05-17: 17:00:00 1 mg via INTRAVENOUS

## 2018-05-17 MED ORDER — ALPRAZOLAM 0.5 MG PO TABS
0.5000 mg | ORAL_TABLET | Freq: Two times a day (BID) | ORAL | Status: DC | PRN
  Administered 2018-05-18 – 2018-05-23 (×9): 0.5 mg via ORAL
  Filled 2018-05-17 (×10): qty 1

## 2018-05-17 MED ORDER — MORPHINE SULFATE (PF) 2 MG/ML IV SOLN
1.0000 mg | INTRAVENOUS | Status: DC | PRN
  Administered 2018-05-17: 1 mg via INTRAVENOUS
  Administered 2018-05-18 (×4): 2 mg via INTRAVENOUS
  Administered 2018-05-18: 1 mg via INTRAVENOUS
  Filled 2018-05-17 (×6): qty 1

## 2018-05-17 MED ORDER — DIPHENHYDRAMINE HCL 50 MG/ML IJ SOLN
INTRAMUSCULAR | Status: AC
  Administered 2018-05-17: 17:00:00 25 mg via INTRAVENOUS
  Filled 2018-05-17: qty 1

## 2018-05-17 NOTE — Progress Notes (Signed)
PHARMACY CONSULT NOTE - FOLLOW UP  Pharmacy Consult for Electrolyte Monitoring and Replacement   Recent Labs: Potassium (mmol/L)  Date Value  05/17/2018 4.5  01/15/2013 4.2   Magnesium (mg/dL)  Date Value  03/13/3610 1.6 (L)  01/14/2013 1.8   Calcium (mg/dL)  Date Value  24/49/7530 8.6 (L)   Calcium, Total (mg/dL)  Date Value  05/29/209 8.8   Albumin (g/dL)  Date Value  17/35/6701 2.4 (L)  01/14/2013 2.4 (L)   Phosphorus (mg/dL)  Date Value  41/03/129 4.3   Sodium (mmol/L)  Date Value  05/17/2018 137  01/15/2013 137     Assessment: Patient admitted with respiratory failure/COPD exacerbation.  Goal of Therapy:  Potassium ~4.0, Magnesium ~2.0  Plan:  Electrolytes WNL. Will follow-up with AM labs.  Pharmacy will continue to monitor.  Mauri Reading, PharmD Pharmacy Resident  05/17/2018 3:49 PM

## 2018-05-17 NOTE — Consult Note (Signed)
WOC Nurse wound consult note Reason for Consult:Patient known to our service, seen approximately 3 weeks ago for lesions on right foot.  Patient has had a left BKA. This also alters her seating pressures on the buttocks and ischial tuberosities.  She is unable to tolerate lowering the HOB below a 45 degree angle due to breathing difficulties (she was restarted on BiPAP this morning due to inability to speak and breathe). This increases ischial tuberosity pressures specifically. Moisture is addressed via an indewelling urinary catheter. Wound type: Pressure, perfusion Pressure Injury POA: Yes Measurement: RGT partial thickness wound 1 x 1 x 0.2cm pink, moist Right heel: 2 x 3 x 0.2cm pink, moist Right IT:3 x 3 area of maroon/purple discoloration (DTPI) Sacral: 2 x 3 x 0.2 Stage 2 PI, pink, moist Wound bed:As described above Drainage (amount, consistency, odor) small serous Periwound:intact Dressing procedure/placement/frequency: Discussion with Bedside RN Sarah. POC will be for topical care to right heel and great toe wounds (PAD, not pressure) with conservative twice daily saline dressings and to place foot into a pressure redistribution heel boot. The risk factors of immobility, altered seating pressures due to amputation are addressed by use of a mattress replacement with low air loss and turn assist features of the mattress.We will continue the mattress replacement upon transfer to the general nursing floor from ICU. Topical care of the ischial tuberosity areas (Right, DTPI and left prevention) will be with silicone foam dressings. The Stage 2 PI at the sacrum will be treated with saline moistened gauze topped with a silicone foam dressing for the sacrum. As noted above, efforts to decrease the [facility] elevation will be attempted as patient tolerates. She complains of back pain and difficulty breathing if head of bed is lowered or attempts to turn in small increments are attempted at this time. A pressure  redistribution chair cushion is provided for use when OOB in chair both here and upon discharge to home.  According to the note made by my partner on 04/25/18, patient is followed by a [facility] outpatient wound care center. Follow up should continue with that group post discharge. If not in place, please consider HHRN.  WOC nursing team will not follow, but will remain available to this patient, the nursing and medical teams.  Please re-consult if needed. Thanks, Ladona Mow, MSN, RN, GNP, [place]  Pager# [phone]

## 2018-05-17 NOTE — Progress Notes (Signed)
Patient ID: Alexandra Goodman, female   DOB: 02/14/1960, 58 y.o.   MRN: [id number] [facility] Date of Admission:  05/09/2018     ID: Alexandra AlaminMary E Goodman is a 58 y.o. female with MSSA bacteremia, Acute resp failure/COPD, rash  Active Problems:   Acute exacerbation of chronic obstructive pulmonary disease (COPD) (HCC)   Pneumonia   Subjective: No fevers, wbc down.  Still on bipap, 30%. Rashj not progressing  ROS  Unable to obtain due to bipap  Medications:  Antibiotics Given (last 72 hours)    Date/Time Action Medication Dose Rate   05/07/2018 0543 New Bag/Given   vancomycin (VANCOCIN) IVPB 1000 mg/200 mL premix 1,000 mg 200 mL/hr   05/02/2018 0549 New Bag/Given   ceFEPIme (MAXIPIME) 2 g in sodium chloride 0.9 % 100 mL IVPB 2 g 200 mL/hr   05/03/2018 0648 New Bag/Given   vancomycin (VANCOCIN) 500 mg in sodium chloride 0.9 % 100 mL IVPB 500 mg 100 mL/hr   05/12/2018 1100 New Bag/Given   cefTRIAXone (ROCEPHIN) 1 g in sodium chloride 0.9 % 100 mL IVPB 1 g 200 mL/hr   04/21/2018 1527 New Bag/Given   azithromycin (ZITHROMAX) 500 mg in sodium chloride 0.9 % 250 mL IVPB 500 mg 250 mL/hr   05/17/18 0635 New Bag/Given   vancomycin (VANCOCIN) 1,250 mg in sodium chloride 0.9 % 250 mL IVPB 1,250 mg 166.7 mL/hr     . amiodarone  200 mg Oral BID  . aspirin EC  81 mg Oral Daily  . atorvastatin  80 mg Oral QHS  . chlorhexidine  15 mL Mouth Rinse BID  . clopidogrel  75 mg Oral Daily  . famotidine  40 mg Oral QPM  . FLUoxetine  20 mg Oral Daily  . fluticasone  1 spray Each Nare Daily  . guaiFENesin  600 mg Oral BID  . insulin aspart  0-15 Units Subcutaneous TID WC  . insulin aspart  0-5 Units Subcutaneous QHS  . ipratropium-albuterol  3 mL Nebulization Q6H  . levothyroxine  88 mcg Oral Daily  . mouth rinse  15 mL Mouth Rinse q12n4p  . mirabegron ER  25 mg Oral Daily  . mometasone-formoterol  2 puff Inhalation BID  . montelukast  10 mg Oral Daily  . tiotropium  1  capsule Inhalation Daily  . traZODone  150 mg Oral QHS  . Warfarin - Pharmacist Dosing Inpatient   Does not apply q1800    Objective: Vital signs in last 24 hours: Temp:  [97.8 F (36.6 C)-98.4 F (36.9 C)] 98.2 F (36.8 C) (04/28 0800) Pulse Rate:  [71-110] 76 (04/28 1100) Resp:  [12-20] 15 (04/28 1100) BP: (63-133)/(40-108) 108/63 (04/28 1100) SpO2:  [89 %-100 %] 100 % (04/28 1100) FiO2 (%):  [30 %] 30 % (04/28 0800) Constitutional:  Awake and interactive, disheveled. On Bipap HENT: [place]/AT, PERRLA, no scleral icterus Mouth/Throat: Oropharynx is clear and moist. No oropharyngeal exudate.  Cardiovascular: Normal rate, regular rhythm and normal heart sounds. Pulmonary/Chest: + exp wheeze Neck = supple, no nuchal rigidity Abdominal: Soft. Bowel sounds are normal.  exhibits no distension. There is no tenderness.  Lymphadenopathy: no cervical adenopathy. No axillary adenopathy Neurological: alert and oriented to person, place, and time.  Skin: on her arms and legs she has numerous bright red, almost targetoid lesions. No drainage, or purulence. Spares palms and soles.  Psychiatric: flat affect, slowed mentation   Lab Results Recent Labs    05/11/2018 0428 05/17/18 0642  WBC  14.7* 8.7  HGB 9.6* 9.0*  HCT 33.1* 30.6*  NA 136 137  K 4.5 4.5  CL 97* 102  CO2 26 28  BUN 31* 35*  CREATININE 1.02* 1.09*    Microbiology: Results for orders placed or performed during the hospital encounter of 05/13/2018  SARS Coronavirus 2 [facility] order, Performed in [place] hospital lab)     Status: None   Collection Time: 05/10/2018  4:28 AM  Result Value Ref Range Status   SARS Coronavirus 2 NEGATIVE NEGATIVE Final    Comment: (NOTE) If result is NEGATIVE SARS-CoV-2 target nucleic acids are NOT DETECTED. The SARS-CoV-2 RNA is generally detectable in upper and lower  respiratory specimens during the acute phase of infection. The lowest  concentration of SARS-CoV-2 viral copies this assay  can detect is 250  copies / mL. A negative result does not preclude SARS-CoV-2 infection  and should not be used as the sole basis for treatment or other  patient management decisions.  A negative result may occur with  improper specimen collection / handling, submission of specimen other  than nasopharyngeal swab, presence of viral mutation(s) within the  areas targeted by this assay, and inadequate number of viral copies  (<250 copies / mL). A negative result must be combined with clinical  observations, patient history, and epidemiological information. If result is POSITIVE SARS-CoV-2 target nucleic acids are DETECTED. The SARS-CoV-2 RNA is generally detectable in upper and lower  respiratory specimens dur ing the acute phase of infection.  Positive  results are indicative of active infection with SARS-CoV-2.  Clinical  correlation with patient history and other diagnostic information is  necessary to determine patient infection status.  Positive results do  not rule out bacterial infection or co-infection with other viruses. If result is PRESUMPTIVE POSTIVE SARS-CoV-2 nucleic acids MAY BE PRESENT.   A presumptive positive result was obtained on the submitted specimen  and confirmed on repeat testing.  While 2019 novel coronavirus  (SARS-CoV-2) nucleic acids may be present in the submitted sample  additional confirmatory testing may be necessary for epidemiological  and / or clinical management purposes  to differentiate between  SARS-CoV-2 and other Sarbecovirus currently known to infect humans.  If clinically indicated additional testing with an alternate test  methodology [phone]) is advised. The SARS-CoV-2 RNA is generally  detectable in upper and lower respiratory sp ecimens during the acute  phase of infection. The expected result is Negative. Fact Sheet for Patients:  StrictlyIdeas.no Fact Sheet for Healthcare  Providers: BankingDealers.co.za This test is not yet approved or cleared by the [place] FDA and has been authorized for detection and/or diagnosis of SARS-CoV-2 by FDA under an Emergency Use Authorization (EUA).  This EUA will remain in effect (meaning this test can be used) for the duration of the COVID-19 declaration under Section 564(b)(1) of the Act, 21 U.S.C. section 360bbb-3(b)(1), unless the authorization is terminated or revoked sooner. Performed at [facility], [address] [place], [place] [address]   Blood Culture (routine x 2)     Status: None (Preliminary result)   Collection Time: 05/18/2018  4:51 AM  Result Value Ref Range Status   Specimen Description BLOOD RIGHT FOREARM  Final   Special Requests   Final    BOTTLES DRAWN AEROBIC AND ANAEROBIC Blood Culture results may not be optimal due to an excessive volume of blood received in culture bottles   Culture   Final    NO GROWTH 1 DAY Performed at  [facility], [address], [place], [place] [address]    Report Status PENDING  Incomplete  Blood Culture (routine x 2)     Status: None (Preliminary result)   Collection Time: 04/29/2018  4:51 AM  Result Value Ref Range Status   Specimen Description BLOOD RIGHT HAND  Final   Special Requests   Final    BOTTLES DRAWN AEROBIC AND ANAEROBIC Blood Culture results may not be optimal due to an inadequate volume of blood received in culture bottles   Culture   Final    NO GROWTH 1 DAY Performed at [facility], [address], [place], [place] [address]    Report Status PENDING  Incomplete  MRSA PCR Screening     Status: None   Collection Time: 05/11/2018  5:53 PM  Result Value Ref Range Status   MRSA by PCR NEGATIVE NEGATIVE Final    Comment:        The GeneXpert MRSA Assay (FDA approved for NASAL specimens only), is one component of a comprehensive MRSA colonization surveillance program. It is not intended to  diagnose MRSA infection nor to guide or monitor treatment for MRSA infections. Performed at [facility], [address] [place], [place] [address]   Culture, sputum-assessment     Status: None   Collection Time: 04/27/2018  8:00 PM  Result Value Ref Range Status   Specimen Description SPUTUM  Final   Special Requests NONE  Final   Sputum evaluation   Final    THIS SPECIMEN IS ACCEPTABLE FOR SPUTUM CULTURE Performed at [facility], [address] [place], [place] [address]    Report Status 04/26/2018 FINAL  Final  Culture, respiratory     Status: None (Preliminary result)   Collection Time: 04/21/2018  8:00 PM  Result Value Ref Range Status   Specimen Description   Final    SPUTUM Performed at [facility], [address] [place], [place] [address]    Special Requests   Final    NONE Reflexed from [id number] Performed at [facility], [address] [place], [place] [address]    Gram Stain   Final    FEW SQUAMOUS EPITHELIAL CELLS PRESENT NO WBC SEEN FEW YEAST Performed at [facility], [address], [place], [place] [address]    Culture PENDING  Incomplete   Report Status PENDING  Incomplete    Studies/Results: Dg Chest Port 1 View  Result Date: 05/17/2018 CLINICAL DATA:  Acute respiratory failure. EXAM: PORTABLE CHEST 1 VIEW COMPARISON:  Radiographs of May 16, 2018. FINDINGS: Stable cardiomegaly. Atherosclerosis of thoracic aorta is noted. Left-sided PICC line is unchanged in position. Stable diffuse interstitial densities are noted throughout both lungs concerning for edema or pneumonia. Stable left basilar atelectasis or infiltrate is noted with associated pleural effusion. Bony thorax is unremarkable. IMPRESSION: Stable bilateral diffuse interstitial densities are noted concerning for edema or pneumonia. Stable left basilar atelectasis or infiltrate is noted with associated pleural effusion. Electronically Signed   By: Lupita Raider M.D.   On: 05/17/2018 07:32   Dg Chest Port 1 View  Result Date: 04/26/2018 CLINICAL DATA:  Shortness of breath. Personal history of COPD. No Mon yeah. EXAM: PORTABLE CHEST 1 VIEW COMPARISON:  One-view chest x-ray 04/22/2018 FINDINGS: Heart size is mildly enlarged. Atherosclerotic changes are again noted at the aortic arch. A diffuse interstitial pattern is superimposed on chronic disease. Bilateral pleural effusions are present. Bibasilar airspace disease likely reflects  atelectasis. No other significant airspace consolidation is present. IMPRESSION: 1. Cardiomegaly with a diffuse interstitial pattern superimposed on chronic COPD. Findings are most concerning for edema and congestive heart failure. 2. Bilateral pleural effusions are present. 3. Bibasilar airspace disease likely reflects atelectasis. Infection is not excluded. Electronically Signed   By: San Morelle M.D.   On: 04/26/2018 06:01    Assessment/Plan: DAVINIA RICCARDI is a 58 y.o. 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 CXR with interstitial CHF pattern on top of chronic COPD and possible new lung infiltrates.  Neg COVID, flu.  [place] ngtd. She has an impressive rash which has a erythema multiforme appearance and is likely due to the cefazolin.  She has no oral lesions I have noted.   Recommendations Dc ceftrixone and cefepime to avoid beta lactams.. Cont vanco for the MSSA bacteremia.  Levofloxacin for CAP type picture as well.  Continue management of COPD and CHF exacerbation per pulmonary.  Thank you very much for the consult. Will follow with you.  Leonel Ramsay   05/17/2018, 11:30 AM

## 2018-05-17 NOTE — Consult Note (Signed)
ANTICOAGULATION CONSULT NOTE - Initial Consult  Pharmacy Consult for Warfarin Indication: Previous DVT  Allergies  Allergen Reactions  . Skin Protectants, Misc. Rash  . Flagyl [Metronidazole]   . Gabapentin   . Levetiracetam   . Tape Other (See Comments)    blisters  . Ace Inhibitors Rash  . Cefazolin Rash    Rash developed on cefazolin while on long-term treatment (was on 3-4wks)  . Ertapenem Rash  . Sertraline Hcl Rash    Patient Measurements: Height: 5\' 3"  (160 cm) Weight: 158 lb 11.7 oz (72 kg) IBW/kg (Calculated) : 52.4   Vital Signs: Temp: 98.2 F (36.8 C) (04/28 0800) Temp Source: Oral (04/28 0800) BP: 108/63 (04/28 1100) Pulse Rate: 76 (04/28 1100)  Labs: Recent Labs    2018/06/03 0428  03-Jun-2018 2317 05/17/18 0642 05/17/18 1116  HGB 9.6*  --   --  9.0*  --   HCT 33.1*  --   --  30.6*  --   PLT 534*  --   --  524*  --   LABPROT 21.7*  --   --  27.7*  --   INR 1.9*  --   --  2.6*  --   CREATININE 1.02*  --   --  1.09*  --   TROPONINI 0.31*   < > 0.86* 0.94* 0.76*   < > = values in this interval not displayed.    Estimated Creatinine Clearance: 54.1 mL/min (A) (by C-G formula based on SCr of 1.09 mg/dL (H)).   Medical History: Past Medical History:  Diagnosis Date  . Asthma   . CHF (congestive heart failure) (HCC)   . Chronic pain   . COPD (chronic obstructive pulmonary disease) (HCC)   . Diabetes mellitus (HCC)   . Peripheral vascular disease (HCC)   . 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 Patient takes amiodarone 200 mg PO BID PTA (continued inpatient).  Assessment: 58 y.o. female admitted on Jun 03, 2018 with COPD exacerbation. Patient was recently discharged with pneumonia and received cefazolin via PICC at home.  Goal of Therapy:  INR 2-3 Monitor platelets by anticoagulation protocol: Yes   Plan:  4/27 INR 1.9 Ordered Warfarin 8.5 mg 4/28 INR 2.6  Held  H&H WNL. Will hold warfarin tonight as INR increased. Levofloxacin was ordered after warfarin dose last night, which could have been am additional reason for INR bump.  Pharmacy will continue to monitor.   Mauri Reading, PharmD Pharmacy Resident  05/17/2018 3:46 PM

## 2018-05-17 NOTE — Progress Notes (Signed)
Patient ID: Alexandra Goodman, female   DOB: 05-22-1960, 58 y.o.   MRN: [id number]  [facility] PROGRESS NOTE  KAREL GOULET PFX:[id number] DOB: [birth year] DOA: 05/17/18 PCP: Floreen Comber, MD  HPI/Subjective: Patient was just taken off BiPAP and was desaturating on regular nasal cannula and required to go on nasal cannula.  She states her breathing has been rough even at home.  She is having some coughing.  Objective: Vitals:   05/17/18 1030 05/17/18 1100  BP: 111/60 108/63  Pulse: 77 76  Resp: 16 15  Temp:    SpO2: 100% 100%    Filed Weights   05-17-2018 0424 May 17, 2018 0900  Weight: 66 kg 72 kg    ROS: Review of Systems  Constitutional: Negative for chills and fever.  Eyes: Negative for blurred vision.  Respiratory: Positive for cough, shortness of breath and wheezing.   Cardiovascular: Negative for chest pain.  Gastrointestinal: Negative for abdominal pain, constipation, diarrhea, nausea and vomiting.  Genitourinary: Negative for dysuria.  Musculoskeletal: Negative for joint pain.  Skin: Positive for rash.  Neurological: Negative for dizziness and headaches.   Exam: Physical Exam  Constitutional: She is oriented to person, place, and time.  HENT:  Nose: No mucosal edema.  Mouth/Throat: No oropharyngeal exudate or posterior oropharyngeal edema.  Eyes: Pupils are equal, round, and reactive to light. Conjunctivae, EOM and lids are normal.  Neck: No JVD present. Carotid bruit is not present. No edema present. No thyroid mass and no thyromegaly present.  Cardiovascular: S1 normal and S2 normal. Exam reveals no gallop.  No murmur heard. Pulses:      Dorsalis pedis pulses are 2+ on the right side and 2+ on the left side.  Respiratory: Accessory muscle usage present. No respiratory distress. She has decreased breath sounds in the right middle field, the right lower field and the left middle field. She has wheezes in the right middle field and the left middle field. She  has rhonchi in the right lower field and the left lower field. She has no rales.  GI: Soft. Bowel sounds are normal. There is no abdominal tenderness.  Musculoskeletal:     Left knee: She exhibits no swelling.     Right ankle: She exhibits no swelling.  Lymphadenopathy:    She has no cervical adenopathy.  Neurological: She is alert and oriented to person, place, and time. No cranial nerve deficit.  Skin: Skin is warm. Nails show no clubbing.  Dark rash on bottom parts of the arms.  Also having a little bit on the legs.  Psychiatric: She has a normal mood and affect.      Data Reviewed: Basic Metabolic Panel: Recent Labs  Lab 05/17/18 0428 05/17/18 0642  NA 136 137  K 4.5 4.5  CL 97* 102  CO2 26 28  GLUCOSE 190* 151*  BUN 31* 35*  CREATININE 1.02* 1.09*  CALCIUM 8.6* 8.6*  MG 1.6*  --    Liver Function Tests: Recent Labs  Lab 05-17-2018 0428  AST 16  ALT <5  ALKPHOS 138*  BILITOT 0.5  PROT 6.5  ALBUMIN 2.4*   CBC: Recent Labs  Lab 05/17/18 0428 05/17/18 0642  WBC 14.7* 8.7  NEUTROABS 13.1*  --   HGB 9.6* 9.0*  HCT 33.1* 30.6*  MCV 89.7 90.0  PLT 534* 524*   Cardiac Enzymes: Recent Labs  Lab 05-17-18 0735 2018-05-17 1753 05-17-18 2317 05/17/18 0642 05/17/18 1116  TROPONINI 0.39* 0.66* 0.86* 0.94* 0.76*   BNP (last 3  results) Recent Labs    04/20/18 1939 04/25/2018 0428  BNP 610.0* 852.0*     CBG: Recent Labs  Lab 04/25/2018 1652 04/25/2018 2023 04/25/2018 2341 05/17/18 0734 05/17/18 1116  GLUCAP 229* 125* 99 138* 116*    Recent Results (from the past 240 hour(s))  SARS Coronavirus 2 [facility](Hospital order, Performed in [facility] lab)     Status: None   Collection Time: 04/25/2018  4:28 AM  Result Value Ref Range Status   SARS Coronavirus 2 NEGATIVE NEGATIVE Final    Comment: (NOTE) If result is NEGATIVE SARS-CoV-2 target nucleic acids are NOT DETECTED. The SARS-CoV-2 RNA is generally detectable in upper and lower  respiratory specimens  during the acute phase of infection. The lowest  concentration of SARS-CoV-2 viral copies this assay can detect is 250  copies / mL. A negative result does not preclude SARS-CoV-2 infection  and should not be used as the sole basis for treatment or other  patient management decisions.  A negative result may occur with  improper specimen collection / handling, submission of specimen other  than nasopharyngeal swab, presence of viral mutation(s) within the  areas targeted by this assay, and inadequate number of viral copies  (<250 copies / mL). A negative result must be combined with clinical  observations, patient history, and epidemiological information. If result is POSITIVE SARS-CoV-2 target nucleic acids are DETECTED. The SARS-CoV-2 RNA is generally detectable in upper and lower  respiratory specimens dur ing the acute phase of infection.  Positive  results are indicative of active infection with SARS-CoV-2.  Clinical  correlation with patient history and other diagnostic information is  necessary to determine patient infection status.  Positive results do  not rule out bacterial infection or co-infection with other viruses. If result is PRESUMPTIVE POSTIVE SARS-CoV-2 nucleic acids MAY BE PRESENT.   A presumptive positive result was obtained on the submitted specimen  and confirmed on repeat testing.  While 2019 novel coronavirus  (SARS-CoV-2) nucleic acids may be present in the submitted sample  additional confirmatory testing may be necessary for epidemiological  and / or clinical management purposes  to differentiate between  SARS-CoV-2 and other Sarbecovirus currently known to infect humans.  If clinically indicated additional testing with an alternate test  methodology [phone]) is advised. The SARS-CoV-2 RNA is generally  detectable in upper and lower respiratory sp ecimens during the acute  phase of infection. The expected result is Negative. Fact Sheet for Patients:   BoilerBrush.com.cyhttps://www.fda.gov/media/136312/download Fact Sheet for Healthcare Providers: https://pope.com/https://www.fda.gov/media/136313/download This test is not yet approved or cleared by the [place] FDA and has been authorized for detection and/or diagnosis of SARS-CoV-2 by FDA under an Emergency Use Authorization (EUA).  This EUA will remain in effect (meaning this test can be used) for the duration of the COVID-19 declaration under Section 564(b)(1) of the Act, 21 U.S.C. section 360bbb-3(b)(1), unless the authorization is terminated or revoked sooner. Performed at [facility], [address] [place], [place] [address]   Blood Culture (routine x 2)     Status: None (Preliminary result)   Collection Time: 04/25/2018  4:51 AM  Result Value Ref Range Status   Specimen Description BLOOD RIGHT FOREARM  Final   Special Requests   Final    BOTTLES DRAWN AEROBIC AND ANAEROBIC Blood Culture results may not be optimal due to an excessive volume of blood received in culture bottles   Culture   Final    NO GROWTH 1 DAY Performed at  [facility], [address], [place], [place] [address]    Report Status PENDING  Incomplete  Blood Culture (routine x 2)     Status: None (Preliminary result)   Collection Time: 04/29/2018  4:51 AM  Result Value Ref Range Status   Specimen Description BLOOD RIGHT HAND  Final   Special Requests   Final    BOTTLES DRAWN AEROBIC AND ANAEROBIC Blood Culture results may not be optimal due to an inadequate volume of blood received in culture bottles   Culture   Final    NO GROWTH 1 DAY Performed at [facility], [address] [place], [place] [address]    Report Status PENDING  Incomplete  MRSA PCR Screening     Status: None   Collection Time: 04/21/2018  5:53 PM  Result Value Ref Range Status   MRSA by PCR NEGATIVE NEGATIVE Final    Comment:        The GeneXpert MRSA Assay (FDA approved for NASAL specimens only), is one component of a comprehensive  MRSA colonization surveillance program. It is not intended to diagnose MRSA infection nor to guide or monitor treatment for MRSA infections. Performed at [facility], [address] [place], [place] [address]   Culture, sputum-assessment     Status: None   Collection Time: 05/15/2018  8:00 PM  Result Value Ref Range Status   Specimen Description SPUTUM  Final   Special Requests NONE  Final   Sputum evaluation   Final    THIS SPECIMEN IS ACCEPTABLE FOR SPUTUM CULTURE Performed at [facility], [address], [place], [place] [address]    Report Status 04/28/2018 FINAL  Final  Culture, respiratory     Status: None (Preliminary result)   Collection Time: 04/23/2018  8:00 PM  Result Value Ref Range Status   Specimen Description   Final    SPUTUM Performed at [facility], [address], [place], [place] [address]    Special Requests   Final    NONE Reflexed from [id number] Performed at [facility], [address] [place], [place] [address]    Gram Stain   Final    FEW SQUAMOUS EPITHELIAL CELLS PRESENT NO WBC SEEN FEW YEAST Performed at [facility], [address], [place], [place] [address]    Culture PENDING  Incomplete   Report Status PENDING  Incomplete     Studies: Dg Chest Port 1 View  Result Date: 05/17/2018 CLINICAL DATA:  Acute respiratory failure. EXAM: PORTABLE CHEST 1 VIEW COMPARISON:  Radiographs of May 16, 2018. FINDINGS: Stable cardiomegaly. Atherosclerosis of thoracic aorta is noted. Left-sided PICC line is unchanged in position. Stable diffuse interstitial densities are noted throughout both lungs concerning for edema or pneumonia. Stable left basilar atelectasis or infiltrate is noted with associated pleural effusion. Bony thorax is unremarkable. IMPRESSION: Stable bilateral diffuse interstitial densities are noted concerning for edema or pneumonia. Stable left basilar atelectasis or infiltrate is noted with  associated pleural effusion. Electronically Signed   By: Lupita Raider M.D.   On: 05/17/2018 07:32   Dg Chest Port 1 View  Result Date: 05/09/2018 CLINICAL DATA:  Shortness of breath. Personal history of COPD. No Mon yeah. EXAM: PORTABLE CHEST 1 VIEW COMPARISON:  One-view chest x-ray 04/22/2018 FINDINGS: Heart size is mildly enlarged. Atherosclerotic changes are again noted at the aortic arch. A diffuse interstitial pattern is superimposed on chronic disease. Bilateral pleural effusions are present. Bibasilar airspace disease likely  reflects atelectasis. No other significant airspace consolidation is present. IMPRESSION: 1. Cardiomegaly with a diffuse interstitial pattern superimposed on chronic COPD. Findings are most concerning for edema and congestive heart failure. 2. Bilateral pleural effusions are present. 3. Bibasilar airspace disease likely reflects atelectasis. Infection is not excluded. Electronically Signed   By: Marin Roberts M.D.   On: 06-05-2018 06:01    Scheduled Meds: . amiodarone  200 mg Oral BID  . aspirin EC  81 mg Oral Daily  . atorvastatin  80 mg Oral QHS  . chlorhexidine  15 mL Mouth Rinse BID  . clopidogrel  75 mg Oral Daily  . famotidine  40 mg Oral QPM  . FLUoxetine  20 mg Oral Daily  . fluticasone  1 spray Each Nare Daily  . guaiFENesin  600 mg Oral BID  . insulin aspart  0-15 Units Subcutaneous TID WC  . insulin aspart  0-5 Units Subcutaneous QHS  . ipratropium-albuterol  3 mL Nebulization Q6H  . levothyroxine  88 mcg Oral Daily  . mouth rinse  15 mL Mouth Rinse q12n4p  . mirabegron ER  25 mg Oral Daily  . mometasone-formoterol  2 puff Inhalation BID  . montelukast  10 mg Oral Daily  . tiotropium  1 capsule Inhalation Daily  . traZODone  150 mg Oral QHS  . Warfarin - Pharmacist Dosing Inpatient   Does not apply q1800   Continuous Infusions: . levofloxacin (LEVAQUIN) IV    . norepinephrine (LEVOPHED) Adult infusion Stopped (05/17/18 0647)  .  vancomycin Stopped (05/17/18 1610)    Assessment/Plan:  1. Acute on chronic hypoxic respiratory failure.  The patient has required BiPAP since coming into the hospital.  Was tapered to nasal cannula but started desaturating and needed to be placed on high flow nasal cannula 15 L.  Case discussed with respiratory therapy. 2. Acute on chronic diastolic congestive heart failure.  Can consider Lasix. 3. Healthcare associated pneumonia on vancomycin and Levaquin 4. COPD exacerbation.  On nebulizer therapy 5. MSSA bacteremia and discitis.  Antibiotics switched over to vancomycin for now. 6. Rash.  Beta lactams discontinued.  Observe rash 7. Elevated troponin likely demand ischemia from acute hypoxic respiratory failure and CHF 8. History of stroke and peripheral vascular disease therapeutic on Coumadin 9. Hypothyroidism unspecified on levothyroxine 10. Hyperlipidemia unspecified on atorvastatin 11. Depression on Prozac 12. Type 2 diabetes mellitus.  Last hemoglobin A1c 9.6.  On sliding scale  Code Status:     Code Status Orders  (From admission, onward)         Start     Ordered   06/05/2018 0703  Full code  Continuous     Jun 05, 2018 0706        Code Status History    Date Active Date Inactive Code Status Order ID Comments User Context   2018-06-05 0437 Jun 05, 2018 0706 Full Code [id number]  Loleta Rose, MD ED   04/20/2018 1926 04/27/2018 1843 Full Code [id number]  Judithe Modest, NP Inpatient   04/20/2018 1903 04/20/2018 1926 Full Code [id number]  Houston Siren, MD Inpatient    Advance Directive Documentation     Most Recent Value  Type of Advance Directive  Living will  Pre-existing out of facility DNR order (yellow form or pink MOST form)  -  "MOST" Form in Place?  -     Family Communication: As per critical care specialist Disposition Plan: To be determined  Consultants:  Critical care specialist  Infectious disease  Antibiotics:  Vancomycin  Levaquin  Time spent: 28  minutes  Maxson Oddo [employer]

## 2018-05-17 NOTE — Consult Note (Addendum)
Pharmacy Antibiotic Note  Alexandra Goodman is a 58 y.o. female admitted on Jun 08, 2018 with COPD exacerbation. Patient was recently discharged with MSSA bacteremia and received cefazolin via PICC at home. Patient now has rash thought to be attributed to cefazolin. ID is consulted.  Pharmacy has been consulted for Vancomycin dosing.  Plan: Scr baseline appears to be ~0.7, Scr 1.02 >> 1.09 Decrease Vancomycin to 1750 mg IV q36h starting tomorrow as patient received 1250 mg this AM at 0800  Vancomycin Kinetics Using Scr 1.0, IBW, and Vd 0.72 Goal AUC 400-550 Anticipated AUC 517.4  Continue Levofloxacin 750 mg IV q24h for PNA.  Height: 5\' 3"  (160 cm) Weight: 158 lb 11.7 oz (72 kg) IBW/kg (Calculated) : 52.4  Temp (24hrs), Avg:98.1 F (36.7 C), Min:97.8 F (36.6 C), Max:98.4 F (36.9 C)  Recent Labs  Lab 08-Jun-2018 0428 2018/06/08 0626 05/17/18 0642  WBC 14.7*  --  8.7  CREATININE 1.02*  --  1.09*  LATICACIDVEN 2.3* 2.1*  --     Estimated Creatinine Clearance: 54.1 mL/min (A) (by C-G formula based on SCr of 1.09 mg/dL (H)).    Allergies  Allergen Reactions  . Skin Protectants, Misc. Rash  . Flagyl [Metronidazole]   . Gabapentin   . Levetiracetam   . Tape Other (See Comments)    blisters  . Ace Inhibitors Rash  . Cefazolin Rash    Rash developed on cefazolin while on long-term treatment (was on 3-4wks)  . Ertapenem Rash  . Sertraline Hcl Rash    Antimicrobials this admission: 4/27 Azithromycin x 1 4/27 Cefepime x 1 4/27 Vancomycin >> 4/27 Levofloxacin >>  Dose adjustments this admission: 4/28 Decreased Vancomycin from 1250 mg q24 to 1750 mg q36 (starting 4/29)  Microbiology results: 4/27 BCx: NGTD  4/27 Sputum: no WBC 4/27 MRSA PCR: (-) 4/27 UCx pending 4/27 COVID (-)  Thank you for allowing pharmacy to be a part of this patient's care.  Mauri Reading, PharmD Pharmacy Resident  05/17/2018 3:50 PM

## 2018-05-17 NOTE — Progress Notes (Signed)
CRITICAL CARE NOTE      CHIEF COMPLAINT:   Acute hypoxemic respiratory failue   SUBJECTIVE FINDINGS & SIGNIFICANT EVENTS   Clinically improved on BIPAP, able to speak in few word fragments now.  Prognosis is guarded Discussed case with Dr Sampson Goon Pt repeatedly asks for narcotics.    PAST MEDICAL HISTORY   Past Medical History:  Diagnosis Date  . Asthma   . CHF (congestive heart failure) (HCC)   . Chronic pain   . COPD (chronic obstructive pulmonary disease) (HCC)   . Diabetes mellitus (HCC)   . Peripheral vascular disease (HCC)   . Stroke (cerebrum) [facility])      SURGICAL HISTORY   Past Surgical History:  Procedure Laterality Date  . below the knee LLE amputation Left      FAMILY HISTORY   Family History  Problem Relation Age of Onset  . Heart disease Mother   . Heart disease Father      SOCIAL HISTORY   Social History   Tobacco Use  . Smoking status: Current Every Day Smoker    Packs/day: 2.00    Years: 45.00    Pack years: 90.00    Types: Cigarettes  . Smokeless tobacco: Never Used  Substance Use Topics  . Alcohol use: Not Currently  . Drug use: Never     MEDICATIONS   Current Medication:  Current Facility-Administered Medications:  .  albuterol (PROVENTIL) (2.5 MG/3ML) 0.083% nebulizer solution 2.5 mg, 2.5 mg, Nebulization, Q4H PRN, Seals, Angela H, NP .  amiodarone (PACERONE) tablet 200 mg, 200 mg, Oral, BID, Seals, Angela H, NP, 200 mg at 05/14/2018 2138 .  aspirin EC tablet 81 mg, 81 mg, Oral, Daily, Seals, Angela H, NP .  atorvastatin (LIPITOR) tablet 80 mg, 80 mg, Oral, QHS, Seals, Angela H, NP, 80 mg at 04/23/2018 2138 .  chlorhexidine (PERIDEX) 0.12 % solution 15 mL, 15 mL, Mouth Rinse, BID, Karna Christmas, Kareemah Grounds, MD, 15 mL at 05/17/18 0924 .  clopidogrel (PLAVIX)  tablet 75 mg, 75 mg, Oral, Daily, Seals, Angela H, NP .  famotidine (PEPCID) tablet 40 mg, 40 mg, Oral, QPM, Seals, Angela H, NP .  FLUoxetine (PROZAC) capsule 20 mg, 20 mg, Oral, Daily, Seals, Angela H, NP .  fluticasone (FLONASE) 50 MCG/ACT nasal spray 1 spray, 1 spray, Each Nare, Daily, Seals, Angela H, NP, 1 spray at 05/17/18 [phone] .  guaiFENesin (MUCINEX) 12 hr tablet 600 mg, 600 mg, Oral, BID, Mansy, Jan A, MD, 600 mg at 04/24/2018 2139 .  insulin aspart (novoLOG) injection 0-15 Units, 0-15 Units, Subcutaneous, TID WC, Vida Rigger, MD, 3 Units at 05/17/18 [phone] .  insulin aspart (novoLOG) injection 0-5 Units, 0-5 Units, Subcutaneous, QHS, Stephenson Cichy, MD .  ipratropium-albuterol (DUONEB) 0.5-2.5 (3) MG/3ML nebulizer solution 3 mL, 3 mL, Nebulization, Q6H, Seals, Angela H, NP, 3 mL at 05/17/18 0729 .  levofloxacin (LEVAQUIN) IVPB 750 mg, 750 mg, Intravenous, Q24H, Mick Sell, MD .  levothyroxine (SYNTHROID) tablet 88 mcg, 88 mcg, Oral, Daily, Seals, Milas Kocher, NP, 88 mcg at 05/17/18 5409 .  MEDLINE mouth rinse, 15 mL, Mouth Rinse, q12n4p, Thorsten Climer, MD, 15 mL at 05/04/2018 1750 .  mirabegron ER (MYRBETRIQ) tablet 25 mg, 25 mg, Oral, Daily, Seals, Angela H, NP .  mometasone-formoterol (DULERA) 200-5 MCG/ACT inhaler 2 puff, 2 puff, Inhalation, BID, Seals, Milas Kocher, NP, 2 puff at 05/09/2018 2254 .  montelukast (SINGULAIR) tablet 10 mg, 10 mg, Oral, Daily, Seals, Milas Kocher, NP .  norepinephrine (LEVOPHED)  4mg  in 250mL premix infusion, 0-40 mcg/min, Intravenous, Titrated, Eugenie NorrieBlakeney, Dana G, NP, Stopped at 05/17/18 [phone] .  oxyCODONE-acetaminophen (PERCOCET/ROXICET) 5-325 MG per tablet 1 tablet, 1 tablet, Oral, Q8H PRN, 1 tablet at 03-20-18 2139 **AND** oxyCODONE (Oxy IR/ROXICODONE) immediate release tablet 5 mg, 5 mg, Oral, Q8H PRN, Seals, Angela H, NP .  tiotropium (SPIRIVA) inhalation capsule ([facility] use ONLY) 18 mcg, 1 capsule, Inhalation, Daily, Seals, Angela H, NP .  traZODone  (DESYREL) tablet 150 mg, 150 mg, Oral, QHS, Seals, Angela H, NP, 150 mg at 03-20-18 2139 .  vancomycin (VANCOCIN) 1,250 mg in sodium chloride 0.9 % 250 mL IVPB, 1,250 mg, Intravenous, Q24H, Mauri ReadingMartin, Savanna M, [facility], Last Rate: 166.7 mL/hr at 05/17/18 0800 .  Warfarin - Pharmacist Dosing Inpatient, , Does not apply, q1800, Alford HighlandWieting, Richard, MD    ALLERGIES   Skin protectants, misc.; Flagyl [metronidazole]; Gabapentin; Levetiracetam; Tape; Ace inhibitors; Cefazolin; Ertapenem; and Sertraline hcl    REVIEW OF SYSTEMS     10 point ROS conducted and is negative except as per subjective findings  PHYSICAL EXAMINATION   Vitals:   05/17/18 0730 05/17/18 0800  BP: 133/80 108/60  Pulse: (!) 110 84  Resp: 20 15  Temp:  98.2 F (36.8 C)  SpO2: 90% 100%    GENERAL: Mild distress due to dyspnea HEAD: Normocephalic, atraumatic.  EYES: Pupils equal, round, reactive to light.  No scleral icterus.  MOUTH: Moist mucosal membrane. NECK: Supple. No thyromegaly. No nodules. No JVD.  PULMONARY: Mild rhonchorous breath sounds bilaterally without wheezing CARDIOVASCULAR: S1 and S2. Regular rate and rhythm. No murmurs, rubs, or gallops.  GASTROINTESTINAL: Soft, nontender, non-distended. No masses. Positive bowel sounds. No hepatosplenomegaly.  MUSCULOSKELETAL: No swelling, clubbing, or edema.  NEUROLOGIC: Mild distress due to acute illness SKIN:intact,warm,dry   LABS AND IMAGING     LAB RESULTS: Recent Labs  Lab 03-20-18 0428 05/17/18 0642  NA 136 137  K 4.5 4.5  CL 97* 102  CO2 26 28  BUN 31* 35*  CREATININE 1.02* 1.09*  GLUCOSE 190* 151*   Recent Labs  Lab 03-20-18 0428 05/17/18 0642  HGB 9.6* 9.0*  HCT 33.1* 30.6*  WBC 14.7* 8.7  PLT 534* 524*     IMAGING RESULTS: Dg Chest Port 1 View  Result Date: 05/17/2018 CLINICAL DATA:  Acute respiratory failure. EXAM: PORTABLE CHEST 1 VIEW COMPARISON:  Radiographs of May 16, 2018. FINDINGS: Stable cardiomegaly. Atherosclerosis  of thoracic aorta is noted. Left-sided PICC line is unchanged in position. Stable diffuse interstitial densities are noted throughout both lungs concerning for edema or pneumonia. Stable left basilar atelectasis or infiltrate is noted with associated pleural effusion. Bony thorax is unremarkable. IMPRESSION: Stable bilateral diffuse interstitial densities are noted concerning for edema or pneumonia. Stable left basilar atelectasis or infiltrate is noted with associated pleural effusion. Electronically Signed   By: Lupita RaiderJames  Green Jr M.D.   On: 05/17/2018 07:32      ASSESSMENT AND PLAN   -Multidisciplinary rounds held today  Acute on chronic hypoxic Respiratory Failure        -Multifactorial in etiology 1. Acute decompensated diastolic CHF EF >65%             -Diuresing gently in view of hypertension and possible infectious pneumonia. 2.  Bibasilar atelectasis -aggressive bronchopulmonary hygiene, MetaNeb therapy twice daily, chest physiotherapy-discussed with respiratory therapist 3.  Possible community-acquired pneumonia- urine Legionella antigen sent, strep pneumo antigen negative, respiratory specimen negative thus far, blood cultures ngtd.  CXR with new infiltrate  compared to PA film from April 22, 2018.  Currently on empiric CAP regimen 4.  COPD-without grossly apparent exacerbation   -We will continue typical COPD care path with nebulizer therapy as well as flutter valve and bronchopulmonary hygiene, no need for systemic glucocorticoids at this time.     Maculopapular rash    -likely due to recent prolonged IV cefazolin vs plavix, latter being less likely due to chronicity and tempo    - ID consultation - appreciate input    CARDIAC FAILURE-         -As above acute decompensated diastolic CHF    -BNP elevated >829 -oxygen as needed -Lasix as tolerated -follow up cardiac enzymes as indicated ICU monitoring   Moderate protein calorie malnutrition    -Albumin 2.4    Dietary  consultation   ID- hx of bacteremia and osteomyelitis    - microbiology pending  -continue IV abx as prescibed -follow up cultures   GI/Nutrition GI PROPHYLAXIS as indicated DIET-->TF's as tolerated Constipation protocol as indicated   ENDO - ICU hypoglycemic\Hyperglycemia protocol -check FSBS per protocol   ELECTROLYTES -follow labs as needed -replace as needed -pharmacy consultation   DVT/GI PRX ordered -SCDs  TRANSFUSIONS AS NEEDED MONITOR FSBS ASSESS the need for LABS as needed   Critical care provider statement:   Critical care time (minutes): 39  Critical care time was exclusive of: Separately billable procedures and treating other patients  Critical care was necessary to treat or prevent imminent or life-threatening deterioration of the following conditions:  Acute hypoxemic respiratory failure, severe COPD, hyperglycemia, moderate protein calorie malnutrition, multiple comorbid conditions  Critical care was time spent personally by me on the following activities: Development of treatment plan with patient or surrogate, discussions with consultants, evaluation of patient's response to treatment, examination of patient, obtaining history from patient or surrogate, ordering and performing treatments and interventions, ordering and review of laboratory studies and re-evaluation of patient's condition.  I assumed direction of critical care for this patient from another provider in my specialty: no    This document was prepared using Dragon voice recognition software and may include unintentional dictation errors.    Vida Rigger, M.D.  Division of Pulmonary & Critical Care Medicine  [facility]

## 2018-05-18 LAB — URINE CULTURE
Culture: NO GROWTH
Special Requests: NORMAL

## 2018-05-18 LAB — GLUCOSE, CAPILLARY
Glucose-Capillary: 123 mg/dL — ABNORMAL HIGH (ref 70–99)
Glucose-Capillary: 106 mg/dL — ABNORMAL HIGH (ref 70–99)
Glucose-Capillary: 298 mg/dL — ABNORMAL HIGH (ref 70–99)
Glucose-Capillary: 186 mg/dL — ABNORMAL HIGH (ref 70–99)
Glucose-Capillary: 265 mg/dL — ABNORMAL HIGH (ref 70–99)

## 2018-05-18 LAB — BASIC METABOLIC PANEL
Anion gap: 7 (ref 5–15)
BUN: 28 mg/dL — ABNORMAL HIGH (ref 6–20)
CO2: 27 mmol/L (ref 22–32)
Calcium: 8.1 mg/dL — ABNORMAL LOW (ref 8.9–10.3)
Chloride: 107 mmol/L (ref 98–111)
Creatinine, Ser: 0.73 mg/dL (ref 0.44–1.00)
GFR calc Af Amer: >60 mL/min (ref >60)
GFR calc non Af Amer: >60 mL/min (ref >60)
Glucose, Bld: 56 mg/dL — ABNORMAL LOW (ref 70–99)
Potassium: 4 mmol/L (ref 3.5–5.1)
Sodium: 141 mmol/L (ref 135–145)

## 2018-05-18 LAB — PHOSPHORUS: Phosphorus: 3.6 mg/dL (ref 2.5–4.6)

## 2018-05-18 LAB — BRAIN NATRIURETIC PEPTIDE: B Natriuretic Peptide: 1105 pg/mL — ABNORMAL HIGH (ref 0.0–100.0)

## 2018-05-18 LAB — CBC
HCT: 27.4 % — ABNORMAL LOW (ref 36.0–46.0)
Hemoglobin: 8 g/dL — ABNORMAL LOW (ref 12.0–15.0)
MCH: 26.2 pg (ref 26.0–34.0)
MCHC: 29.2 g/dL — ABNORMAL LOW (ref 30.0–36.0)
MCV: 89.8 fL (ref 80.0–100.0)
Platelets: 403 10*3/uL — ABNORMAL HIGH (ref 150–400)
RBC: 3.05 MIL/uL — ABNORMAL LOW (ref 3.87–5.11)
RDW: 23.4 % — ABNORMAL HIGH (ref 11.5–15.5)
WBC: 5.3 10*3/uL (ref 4.0–10.5)
nRBC: 0 % (ref 0.0–0.2)

## 2018-05-18 LAB — MAGNESIUM: Magnesium: 2.2 mg/dL (ref 1.7–2.4)

## 2018-05-18 LAB — HIV ANTIBODY (ROUTINE TESTING W REFLEX): HIV Screen 4th Generation wRfx: NONREACTIVE

## 2018-05-18 LAB — PROTIME-INR
INR: 3.9 — ABNORMAL HIGH (ref 0.8–1.2)
Prothrombin Time: 37.3 seconds — ABNORMAL HIGH (ref 11.4–15.2)

## 2018-05-18 MED ORDER — LEVOFLOXACIN 750 MG PO TABS
750.0000 mg | ORAL_TABLET | Freq: Every day | ORAL | Status: AC
  Administered 2018-05-18 – 2018-05-21 (×4): 750 mg via ORAL
  Filled 2018-05-18 (×4): qty 1

## 2018-05-18 MED ORDER — TIOTROPIUM BROMIDE MONOHYDRATE 18 MCG IN CAPS
1.0000 | ORAL_CAPSULE | Freq: Every day | RESPIRATORY_TRACT | Status: DC
  Administered 2018-05-21 – 2018-05-23 (×3): 18 ug via RESPIRATORY_TRACT
  Filled 2018-05-18: qty 5

## 2018-05-18 MED ORDER — FUROSEMIDE 10 MG/ML IJ SOLN: 40.0000 mg | Freq: Once | INTRAMUSCULAR | Status: DC

## 2018-05-18 MED ORDER — FUROSEMIDE 10 MG/ML IJ SOLN
40.0000 mg | Freq: Once | INTRAMUSCULAR | Status: AC
  Administered 2018-05-18: 11:00:00 40 mg via INTRAVENOUS
  Filled 2018-05-18: qty 4

## 2018-05-18 MED ORDER — MORPHINE SULFATE (PF) 2 MG/ML IV SOLN
1.0000 mg | INTRAVENOUS | Status: DC | PRN
  Administered 2018-05-19 – 2018-05-21 (×15): 2 mg via INTRAVENOUS
  Administered 2018-05-22: 17:00:00 1 mg via INTRAVENOUS
  Administered 2018-05-22: 01:00:00 2 mg via INTRAVENOUS
  Administered 2018-05-22: 11:00:00 1 mg via INTRAVENOUS
  Administered 2018-05-22 – 2018-05-27 (×17): 2 mg via INTRAVENOUS
  Filled 2018-05-18 (×37): qty 1

## 2018-05-18 NOTE — Progress Notes (Signed)
Patient ID: Alexandra Goodman, female   DOB: 10-Oct-1960, 58 y.o.   MRN: [id number] [facility] CLINIC INFECTIOUS DISEASE PROGRESS NOTE Date of Admission:  Jun 03, 2018     ID: Alexandra Goodman is a 58 y.o. female with MSSA bacteremia, Acute resp failure/COPD, rash  Active Problems:   Acute exacerbation of chronic obstructive pulmonary disease (COPD) (HCC)   Pneumonia   Subjective: No fevers, wbc down.  Off BiPAP and on high flow.  Rash is stable  ROS  11 systems reviewed and negative except as per HPI  Medications:  Antibiotics Given (last 72 hours)    Date/Time Action Medication Dose Rate   Jun 03, 2018 0543 New Bag/Given   vancomycin (VANCOCIN) IVPB 1000 mg/200 mL premix 1,000 mg 200 mL/hr   06-03-2018 0549 New Bag/Given   ceFEPIme (MAXIPIME) 2 g in sodium chloride 0.9 % 100 mL IVPB 2 g 200 mL/hr   06/03/2018 0648 New Bag/Given   vancomycin (VANCOCIN) 500 mg in sodium chloride 0.9 % 100 mL IVPB 500 mg 100 mL/hr   2018-06-03 1100 New Bag/Given   cefTRIAXone (ROCEPHIN) 1 g in sodium chloride 0.9 % 100 mL IVPB 1 g 200 mL/hr   2018-06-03 1527 New Bag/Given   azithromycin (ZITHROMAX) 500 mg in sodium chloride 0.9 % 250 mL IVPB 500 mg 250 mL/hr   05/17/18 0635 New Bag/Given   vancomycin (VANCOCIN) 1,250 mg in sodium chloride 0.9 % 250 mL IVPB 1,250 mg 166.7 mL/hr   05/17/18 1451 New Bag/Given   levofloxacin (LEVAQUIN) IVPB 750 mg 750 mg 100 mL/hr     . amiodarone  200 mg Oral BID  . aspirin EC  81 mg Oral Daily  . atorvastatin  80 mg Oral QHS  . chlorhexidine  15 mL Mouth Rinse BID  . clopidogrel  75 mg Oral Daily  . famotidine  40 mg Oral QPM  . FLUoxetine  20 mg Oral Daily  . fluticasone  1 spray Each Nare Daily  . guaiFENesin  600 mg Oral BID  . insulin aspart  0-15 Units Subcutaneous TID WC  . insulin aspart  0-5 Units Subcutaneous QHS  . ipratropium-albuterol  3 mL Nebulization Q6H  . levofloxacin  750 mg Oral q1800  . levothyroxine  88 mcg Oral Daily  . mouth rinse  15 mL Mouth Rinse  q12n4p  . mirabegron ER  25 mg Oral Daily  . mometasone-formoterol  2 puff Inhalation BID  . montelukast  10 mg Oral Daily  . [START ON 05/21/2018] tiotropium  1 capsule Inhalation Daily  . traZODone  150 mg Oral QHS  . Warfarin - Pharmacist Dosing Inpatient   Does not apply q1800    Objective: Vital signs in last 24 hours: Temp:  [97.4 F (36.3 C)-98 F (36.7 C)] 97.9 F (36.6 C) (04/29 1130) Pulse Rate:  [70-97] 84 (04/29 1430) Resp:  [10-21] 13 (04/29 1430) BP: (93-131)/(30-103) 108/58 (04/29 1430) SpO2:  [88 %-100 %] 92 % (04/29 1430) FiO2 (%):  [28 %-55 %] 40 % (04/29 1550) Constitutional:  Awake and interactive, disheveled. On high flow O2 HENT: [place]/AT, PERRLA, no scleral icterus Mouth/Throat: Oropharynx is clear and moist. No oropharyngeal exudate.  Cardiovascular: Normal rate, regular rhythm and normal heart sounds. Pulmonary/Chest:  Improving air movement.  Minimal wheeze. Neck = supple, no nuchal rigidity Abdominal: Soft. Bowel sounds are normal.  exhibits no distension. There is no tenderness.  Lymphadenopathy: no cervical adenopathy. No axillary adenopathy Neurological: alert and oriented to person, place, and time.  Skin: on  her arms and legs she has numerous bright red, almost targetoid lesions. No drainage, or purulence. Spares palms and soles.  Psychiatric: flat affect, slowed mentation   Lab Results Recent Labs    05/17/18 0642 05/18/18 0429  WBC 8.7 5.3  HGB 9.0* 8.0*  HCT 30.6* 27.4*  NA 137 141  K 4.5 4.0  CL 102 107  CO2 28 27  BUN 35* 28*  CREATININE 1.09* 0.73    Microbiology: Results for orders placed or performed during the hospital encounter of October 29, 2018  SARS Coronavirus 2 [facility](Hospital order, Performed [facility])     Status: None   Collection Time: October 29, 2018  4:28 AM  Result Value Ref Range Status   SARS Coronavirus 2 NEGATIVE NEGATIVE Final    Comment: (NOTE) If result is NEGATIVE SARS-CoV-2 target nucleic acids are NOT  DETECTED. The SARS-CoV-2 RNA is generally detectable in upper and lower  respiratory specimens during the acute phase of infection. The lowest  concentration of SARS-CoV-2 viral copies this assay can detect is 250  copies / mL. A negative result does not preclude SARS-CoV-2 infection  and should not be used as the sole basis for treatment or other  patient management decisions.  A negative result may occur with  improper specimen collection / handling, submission of specimen other  than nasopharyngeal swab, presence of viral mutation(s) within the  areas targeted by this assay, and inadequate number of viral copies  (<250 copies / mL). A negative result must be combined with clinical  observations, patient history, and epidemiological information. If result is POSITIVE SARS-CoV-2 target nucleic acids are DETECTED. The SARS-CoV-2 RNA is generally detectable in upper and lower  respiratory specimens dur ing the acute phase of infection.  Positive  results are indicative of active infection with SARS-CoV-2.  Clinical  correlation with patient history and other diagnostic information is  necessary to determine patient infection status.  Positive results do  not rule out bacterial infection or co-infection with other viruses. If result is PRESUMPTIVE POSTIVE SARS-CoV-2 nucleic acids MAY BE PRESENT.   A presumptive positive result was obtained on the submitted specimen  and confirmed on repeat testing.  While 2019 novel coronavirus  (SARS-CoV-2) nucleic acids may be present in the submitted sample  additional confirmatory testing may be necessary for epidemiological  and / or clinical management purposes  to differentiate between  SARS-CoV-2 and other Sarbecovirus currently known to infect humans.  If clinically indicated additional testing with an alternate test  methodology [phone]([id number]) is advised. The SARS-CoV-2 RNA is generally  detectable in upper and lower respiratory sp ecimens during  the acute  phase of infection. The expected result is Negative. Fact Sheet for Patients:  BoilerBrush.com.cyhttps://www.fda.gov/media/136312/download Fact Sheet for Healthcare Providers: https://pope.com/https://www.fda.gov/media/136313/download This test is not yet approved or cleared by the [place] FDA and has been authorized for detection and/or diagnosis of SARS-CoV-2 by FDA under an Emergency Use Authorization (EUA).  This EUA will remain in effect (meaning this test can be used) for the duration of the COVID-19 declaration under Section 564(b)(1) of the Act, 21 U.S.C. section 360bbb-3(b)(1), unless the authorization is terminated or revoked sooner. Performed at [facility], [address] [place], [place] [address]   Blood Culture (routine x 2)     Status: None (Preliminary result)   Collection Time: October 29, 2018  4:51 AM  Result Value Ref Range Status   Specimen Description BLOOD RIGHT FOREARM  Final   Special Requests   Final  BOTTLES DRAWN AEROBIC AND ANAEROBIC Blood Culture results may not be optimal due to an excessive volume of blood received in culture bottles   Culture   Final    NO GROWTH 2 DAYS Performed at [facility], [address], [place], [place] [address]    Report Status PENDING  Incomplete  Blood Culture (routine x 2)     Status: None (Preliminary result)   Collection Time: 06/06/18  4:51 AM  Result Value Ref Range Status   Specimen Description BLOOD RIGHT HAND  Final   Special Requests   Final    BOTTLES DRAWN AEROBIC AND ANAEROBIC Blood Culture results may not be optimal due to an inadequate volume of blood received in culture bottles   Culture   Final    NO GROWTH 2 DAYS Performed at [facility], [address], [place], [place] [address]    Report Status PENDING  Incomplete  MRSA PCR Screening     Status: None   Collection Time: Jun 06, 2018  5:53 PM  Result Value Ref Range Status   MRSA by PCR NEGATIVE NEGATIVE Final    Comment:        The  GeneXpert MRSA Assay (FDA approved for NASAL specimens only), is one component of a comprehensive MRSA colonization surveillance program. It is not intended to diagnose MRSA infection nor to guide or monitor treatment for MRSA infections. Performed at [facility], [address] [place], [place] [address]   Culture, sputum-assessment     Status: None   Collection Time: 06/06/2018  8:00 PM  Result Value Ref Range Status   Specimen Description SPUTUM  Final   Special Requests NONE  Final   Sputum evaluation   Final    THIS SPECIMEN IS ACCEPTABLE FOR SPUTUM CULTURE Performed at [facility], [address], [place], [place] [address]    Report Status 06-06-18 FINAL  Final  Culture, respiratory     Status: None (Preliminary result)   Collection Time: 2018/06/06  8:00 PM  Result Value Ref Range Status   Specimen Description   Final    SPUTUM Performed at [facility], [address], [place], [place] [address]    Special Requests   Final    NONE Reflexed from [id number] Performed at [facility], [address] [place], [place] [address]    Gram Stain   Final    FEW SQUAMOUS EPITHELIAL CELLS PRESENT NO WBC SEEN FEW YEAST    Culture   Final    ABUNDANT YEAST CULTURE REINCUBATED FOR BETTER GROWTH Performed at [facility], [place] [address] [place], [place] [address]    Report Status PENDING  Incomplete  Urine Culture     Status: None   Collection Time: 06-06-2018  9:10 PM  Result Value Ref Range Status   Specimen Description   Final    URINE, CLEAN CATCH Performed at [facility], [address], [place], [place] [address]    Special Requests   Final    Normal Performed at [facility], [address] [place], [place] [address]    Culture   Final    NO GROWTH Performed at [facility], [place] [address], [place], [place] [address]    Report Status 05/18/2018 FINAL  Final    Studies/Results: Dg Chest  Port 1 View  Result Date: 05/17/2018 CLINICAL DATA:  Acute respiratory failure. EXAM: PORTABLE CHEST 1 VIEW COMPARISON:  Radiographs of 2018-06-06.  FINDINGS: Stable cardiomegaly. Atherosclerosis of thoracic aorta is noted. Left-sided PICC line is unchanged in position. Stable diffuse interstitial densities are noted throughout both lungs concerning for edema or pneumonia. Stable left basilar atelectasis or infiltrate is noted with associated pleural effusion. Bony thorax is unremarkable. IMPRESSION: Stable bilateral diffuse interstitial densities are noted concerning for edema or pneumonia. Stable left basilar atelectasis or infiltrate is noted with associated pleural effusion. Electronically Signed   By: Marijo Conception M.D.   On: 05/17/2018 07:32    Assessment/Plan: Alexandra Goodman is a 58 y.o. 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 CXR with interstitial CHF pattern on top of chronic COPD and possible new lung infiltrates.  Neg COVID, flu.  [place] ngtd. She has an impressive rash which has a erythema multiforme appearance and is likely due to the cefazolin.  She has no oral lesions I have noted.  April 29- she is clinically improving from a respiratory point of view and is off BiPAP onto high flow.  Her rash is improving.  Recommendations Her rash is improving since stopping beta-lactam use.  We will not plan to restart them.  . Cont vanco for the MSSA bacteremia.  We could consider using once a day daptomycin at discharge. Levofloxacin for CAP type picture as well.  Can finish a 7-day total course Continue management of COPD and CHF exacerbation per pulmonary.  Thank you very much for the consult. Will follow with you.  Leonel Ramsay   05/18/2018, 5:44 PM

## 2018-05-18 NOTE — Progress Notes (Signed)
CRITICAL CARE NOTE       SUBJECTIVE FINDINGS & SIGNIFICANT EVENTS   Clinically improved on BIPAP, able to speak in few word fragments now.  Prognosis is guarded Pt repeatedly asks for narcotics.  On BIPAP 50%FiO2 Pt with recurrent admission for copd exacerbation-working on bipap for home, bedside spiro done, approval pending    PAST MEDICAL HISTORY   Past Medical History:  Diagnosis Date  . Asthma   . CHF (congestive heart failure) (HCC)   . Chronic pain   . COPD (chronic obstructive pulmonary disease) (HCC)   . Diabetes mellitus (HCC)   . Peripheral vascular disease (HCC)   . Stroke (cerebrum) [facility])      SURGICAL HISTORY   Past Surgical History:  Procedure Laterality Date  . below the knee LLE amputation Left      FAMILY HISTORY   Family History  Problem Relation Age of Onset  . Heart disease Mother   . Heart disease Father      SOCIAL HISTORY   Social History   Tobacco Use  . Smoking status: Current Every Day Smoker    Packs/day: 2.00    Years: 45.00    Pack years: 90.00    Types: Cigarettes  . Smokeless tobacco: Never Used  Substance Use Topics  . Alcohol use: Not Currently  . Drug use: Never     MEDICATIONS   Current Medication:  Current Facility-Administered Medications:  .  albuterol (PROVENTIL) (2.5 MG/3ML) 0.083% nebulizer solution 2.5 mg, 2.5 mg, Nebulization, Q4H PRN, Seals, Milas Kocher, NP .  ALPRAZolam Prudy Feeler) tablet 0.5 mg, 0.5 mg, Oral, BID PRN, Harlon Ditty D, NP, 0.5 mg at 05/18/18 0823 .  amiodarone (PACERONE) tablet 200 mg, 200 mg, Oral, BID, Seals, Angela H, NP, 200 mg at 05/18/18 1572 .  aspirin EC tablet 81 mg, 81 mg, Oral, Daily, Seals, LaCoste H, NP, 81 mg at 05/18/18 6203 .  atorvastatin (LIPITOR) tablet 80 mg, 80 mg, Oral, QHS, Seals, Angela H, NP,  80 mg at 05/17/18 2157 .  chlorhexidine (PERIDEX) 0.12 % solution 15 mL, 15 mL, Mouth Rinse, BID, Karna Christmas, Asuna Peth, MD, 15 mL at 05/18/18 0827 .  clopidogrel (PLAVIX) tablet 75 mg, 75 mg, Oral, Daily, Seals, [place] H, NP, 75 mg at 05/18/18 5597 .  famotidine (PEPCID) tablet 40 mg, 40 mg, Oral, QPM, Seals, Angela H, NP .  FLUoxetine (PROZAC) capsule 20 mg, 20 mg, Oral, Daily, Seals, Angela H, NP, 20 mg at 05/18/18 4163 .  fluticasone (FLONASE) 50 MCG/ACT nasal spray 1 spray, 1 spray, Each Nare, Daily, Seals, Angela H, NP, 1 spray at 05/18/18 0827 .  guaiFENesin (MUCINEX) 12 hr tablet 600 mg, 600 mg, Oral, BID, Mansy, Jan A, MD, 600 mg at 05/18/18 8453 .  insulin aspart (novoLOG) injection 0-15 Units, 0-15 Units, Subcutaneous, TID WC, Vida Rigger, MD, 3 Units at 05/17/18 [phone] .  insulin aspart (novoLOG) injection 0-5 Units, 0-5 Units, Subcutaneous, QHS, Hampton Cost, MD .  ipratropium-albuterol (DUONEB) 0.5-2.5 (3) MG/3ML nebulizer solution 3 mL, 3 mL, Nebulization, Q6H, Seals, Angela H, NP, 3 mL at 05/18/18 0739 .  levofloxacin (LEVAQUIN) IVPB 750 mg, 750 mg, Intravenous, Q24H, Mick Sell, MD, Stopped at 05/17/18 1621 .  levothyroxine (SYNTHROID) tablet 88 mcg, 88 mcg, Oral, Daily, Seals, [place] H, NP, 88 mcg at 05/18/18 0541 .  MEDLINE mouth rinse, 15 mL, Mouth Rinse, q12n4p, Mickala Laton, MD, 15 mL at 05/17/18 1116 .  mirabegron ER (MYRBETRIQ) tablet 25 mg, 25 mg, Oral,  Daily, Seals, [place] H, NP, 25 mg at 05/18/18 [phone] .  mometasone-formoterol (DULERA) 200-5 MCG/ACT inhaler 2 puff, 2 puff, Inhalation, BID, Seals, Milas KocherAngela H, NP, 2 puff at 05/18/18 0827 .  montelukast (SINGULAIR) tablet 10 mg, 10 mg, Oral, Daily, Seals, Angela H, NP, 10 mg at 05/18/18 [phone] .  morphine 2 MG/ML injection 1-2 mg, 1-2 mg, Intravenous, Q3H PRN, Harlon DittyKeene, Jeremiah D, NP, 2 mg at 05/18/18 0540 .  norepinephrine (LEVOPHED) 4mg  in 250mL premix infusion, 0-40 mcg/min, Intravenous, Titrated, Eugenie NorrieBlakeney, Dana G, NP,  Stopped at 05/17/18 [phone] .  oxyCODONE-acetaminophen (PERCOCET/ROXICET) 5-325 MG per tablet 1 tablet, 1 tablet, Oral, Q8H PRN, 1 tablet at 05/18/18 [phone] **AND** oxyCODONE (Oxy IR/ROXICODONE) immediate release tablet 5 mg, 5 mg, Oral, Q8H PRN, Seals, Angela H, NP, 5 mg at 05/18/18 [phone] .  tiotropium (SPIRIVA) inhalation capsule ([facility] use ONLY) 18 mcg, 1 capsule, Inhalation, Daily, Seals, [place] H, NP, 18 mcg at 05/18/18 0827 .  traZODone (DESYREL) tablet 150 mg, 150 mg, Oral, QHS, Seals, Angela H, NP, 150 mg at 05/17/18 2204 .  vancomycin (VANCOCIN) 1,750 mg in sodium chloride 0.9 % 500 mL IVPB, 1,750 mg, Intravenous, Q36H, Mauri ReadingMartin, Savanna M, RPH .  Warfarin - Pharmacist Dosing Inpatient, , Does not apply, q1800, Alford HighlandWieting, Richard, MD    ALLERGIES   Skin protectants, misc.; Flagyl [metronidazole]; Gabapentin; Levetiracetam; Tape; Ace inhibitors; Cefazolin; Ertapenem; and Sertraline hcl    REVIEW OF SYSTEMS    10 point ROS conducted and is negative except as per subjective findings  PHYSICAL EXAMINATION   Vitals:   05/18/18 0800 05/18/18 0830  BP: (!) 108/56 112/77  Pulse: 81 86  Resp: 10 15  Temp: 98 F (36.7 C)   SpO2: 98% 95%    GENERAL: Mild distress due to shortness of breath, chronically ill-appearing HEAD: Normocephalic, atraumatic.  EYES: Pupils equal, round, reactive to light.  No scleral icterus.  MOUTH: Moist mucosal membrane. NECK: Supple. No thyromegaly. No nodules. No JVD.  PULMONARY: Bilateral rhonchorous breath sounds without wheezing CARDIOVASCULAR: S1 and S2. Regular rate and rhythm. No murmurs, rubs, or gallops.  GASTROINTESTINAL: Soft, nontender, non-distended. No masses. Positive bowel sounds. No hepatosplenomegaly.  MUSCULOSKELETAL: No swelling, clubbing, or edema.  NEUROLOGIC: Mild distress due to acute illness SKIN:intact,warm,dry   LABS AND IMAGING     LAB RESULTS: Recent Labs  Lab 05/09/2018 0428 05/17/18 0642 05/18/18 0429  NA 136 137 141   K 4.5 4.5 4.0  CL 97* 102 107  CO2 26 28 27   BUN 31* 35* 28*  CREATININE 1.02* 1.09* 0.73  GLUCOSE 190* 151* 56*   Recent Labs  Lab 04/28/2018 0428 05/17/18 0642 05/18/18 0429  HGB 9.6* 9.0* 8.0*  HCT 33.1* 30.6* 27.4*  WBC 14.7* 8.7 5.3  PLT 534* 524* 403*     IMAGING RESULTS: No results found.    ASSESSMENT AND PLAN     -Multidisciplinary rounds held today  Acuteon chronic hypoxic Respiratory Failure -Multifactorial in etiology 1. Acute decompensated diastolic CHF EF >65% -Diuresing gentlyin view of hypertension and possible infectious pneumonia. 2.Bibasilar atelectasis -aggressive bronchopulmonary hygiene, MetaNeb therapy twice daily, chest physiotherapy-discussed with respiratory therapist 3.Possible community-acquired pneumonia-urine Legionella antigen sent, strep pneumo antigen negative, respiratory specimen negative thus far, blood cultures ngtd. CXR with new infiltrate compared to PA film from April 22, 2018.Currently on empiric CAP regimen 4.COPD-without grossly apparent exacerbation -We will continue typical COPD care path with nebulizer therapy as well as flutter valve and bronchopulmonary hygiene, no need for systemic glucocorticoids  at this time.    Maculopapular rash -likely due to recent prolonged IV cefazolin vs plavix, latter being less likely due to chronicity and tempo - ID consultation - appreciate input    CARDIAC FAILURE- -As above acute decompensated diastolic CHF -BNP elevated>850 -oxygen as needed -Lasix as tolerated -follow up cardiac enzymes as indicated ICU monitoring   Moderate protein calorie malnutrition -Albumin 2.4 Dietary consultation   ID- hx of bacteremia and osteomyelitis - microbiology pending  -continue IV abx as prescibed -follow up cultures   GI/Nutrition GI PROPHYLAXIS as indicated DIET-->TF's as tolerated Constipation protocol as  indicated   ENDO - ICU hypoglycemic\Hyperglycemia protocol -check FSBS per protocol   ELECTROLYTES -follow labs as needed -replace as needed -pharmacy consultation   DVT/GI PRX ordered -SCDs  TRANSFUSIONS AS NEEDED MONITOR FSBS ASSESS the need for LABS as needed   Critical care provider statement:  Critical care time (minutes):31 Critical care time was exclusive of: Separately billable procedures and treating other patients Critical care was necessary to treat or prevent imminent or life-threatening deterioration of the following conditions:Acute hypoxemic respiratory failure, severe COPD, hyperglycemia, moderate protein calorie malnutrition, multiple comorbid conditions Critical care was time spent personally by me on the following activities: Development of treatment plan with patient or surrogate, discussions with consultants, evaluation of patient's response to treatment, examination of patient, obtaining history from patient or surrogate, ordering and performing treatments and interventions, ordering and review of laboratory studies and re-evaluation of patient's condition. I assumed direction of critical care for this patient from another provider in my specialty: no   This document was prepared using Dragon voice recognition software and may include unintentional dictation errors.    Vida Rigger, M.D.  Division of Pulmonary & Critical Care Medicine  [facility]

## 2018-05-18 NOTE — Progress Notes (Signed)
Patient ID: Alexandra Goodman, female   DOB: April 13, 1960, 58 y.o.   MRN: [id number]  [employer] PROGRESS NOTE  Alexandra Goodman YQI:[id number] DOB: [birth year] DOA: 05/14/2018 PCP: Erline Levine, MD  HPI/Subjective: Patient states that her breathing is a little bit better.  Was just taken off BiPAP and put on high flow nasal cannula 33% FiO2.  Pulse ox borderline in the high 80s.  Patient states that she has back pain.  Objective: Vitals:   05/18/18 1100 05/18/18 1130  BP: 116/61   Pulse: 83   Resp: 14   Temp:  97.9 F (36.6 C)  SpO2: 96%     Filed Weights   05/04/2018 0424 04/25/2018 0900  Weight: 66 kg 72 kg    ROS: Review of Systems  Constitutional: Negative for chills and fever.  Eyes: Negative for blurred vision.  Respiratory: Positive for cough, shortness of breath and wheezing.   Cardiovascular: Negative for chest pain.  Gastrointestinal: Negative for abdominal pain, constipation, diarrhea, nausea and vomiting.  Genitourinary: Negative for dysuria.  Musculoskeletal: Positive for back pain. Negative for joint pain.  Skin: Positive for rash.  Neurological: Negative for dizziness and headaches.   Exam: Physical Exam  Constitutional: She is oriented to person, place, and time.  HENT:  Nose: No mucosal edema.  Mouth/Throat: No oropharyngeal exudate or posterior oropharyngeal edema.  Eyes: Pupils are equal, round, and reactive to light. Conjunctivae, EOM and lids are normal.  Neck: No JVD present. Carotid bruit is not present. No edema present. No thyroid mass and no thyromegaly present.  Cardiovascular: S1 normal and S2 normal. Exam reveals no gallop.  No murmur heard. Pulses:      Dorsalis pedis pulses are 2+ on the right side and 2+ on the left side.  Respiratory: No accessory muscle usage. No respiratory distress. She has decreased breath sounds in the right lower field and the left lower field. She has no wheezes. She has rhonchi in the right lower field and the  left lower field. She has no rales.  GI: Soft. Bowel sounds are normal. There is no abdominal tenderness.  Musculoskeletal:     Left knee: She exhibits no swelling.     Right ankle: She exhibits no swelling.  Lymphadenopathy:    She has no cervical adenopathy.  Neurological: She is alert and oriented to person, place, and time. No cranial nerve deficit.  Skin: Skin is warm. Nails show no clubbing.  Dark palpable purpura rash on bottom parts of the arms.  Also having a little bit on the legs.  Psychiatric: She has a normal mood and affect.      Data Reviewed: Basic Metabolic Panel: Recent Labs  Lab 04/26/2018 0428 05/17/18 0642 05/18/18 0429  NA 136 137 141  K 4.5 4.5 4.0  CL 97* 102 107  CO2 26 28 27   GLUCOSE 190* 151* 56*  BUN 31* 35* 28*  CREATININE 1.02* 1.09* 0.73  CALCIUM 8.6* 8.6* 8.1*  MG 1.6*  --  2.2  PHOS  --   --  3.6   Liver Function Tests: Recent Labs  Lab 05/11/2018 0428  AST 16  ALT <5  ALKPHOS 138*  BILITOT 0.5  PROT 6.5  ALBUMIN 2.4*   CBC: Recent Labs  Lab 05/01/2018 0428 05/17/18 0642 05/18/18 0429  WBC 14.7* 8.7 5.3  NEUTROABS 13.1*  --   --   HGB 9.6* 9.0* 8.0*  HCT 33.1* 30.6* 27.4*  MCV 89.7 90.0 89.8  PLT 534* 524* 403*  Cardiac Enzymes: Recent Labs  Lab May 21, 2018 0735 05-21-18 1753 05/21/2018 2317 05/17/18 0642 05/17/18 1116  TROPONINI 0.39* 0.66* 0.86* 0.94* 0.76*   BNP (last 3 results) Recent Labs    04/20/18 1939 2018/05/21 0428 05/18/18 0429  BNP 610.0* 852.0* 1,105.0*     CBG: Recent Labs  Lab 05/17/18 1629 05/17/18 2131 05/18/18 0633 05/18/18 0740 05/18/18 1148  GLUCAP 122* 83 123* 106* 298*    Recent Results (from the past 240 hour(s))  SARS Coronavirus 2 [facility] order, Performed in [facility] hospital lab)     Status: None   Collection Time: May 21, 2018  4:28 AM  Result Value Ref Range Status   SARS Coronavirus 2 NEGATIVE NEGATIVE Final    Comment: (NOTE) If result is NEGATIVE SARS-CoV-2 target  nucleic acids are NOT DETECTED. The SARS-CoV-2 RNA is generally detectable in upper and lower  respiratory specimens during the acute phase of infection. The lowest  concentration of SARS-CoV-2 viral copies this assay can detect is 250  copies / mL. A negative result does not preclude SARS-CoV-2 infection  and should not be used as the sole basis for treatment or other  patient management decisions.  A negative result may occur with  improper specimen collection / handling, submission of specimen other  than nasopharyngeal swab, presence of viral mutation(s) within the  areas targeted by this assay, and inadequate number of viral copies  (<250 copies / mL). A negative result must be combined with clinical  observations, patient history, and epidemiological information. If result is POSITIVE SARS-CoV-2 target nucleic acids are DETECTED. The SARS-CoV-2 RNA is generally detectable in upper and lower  respiratory specimens dur ing the acute phase of infection.  Positive  results are indicative of active infection with SARS-CoV-2.  Clinical  correlation with patient history and other diagnostic information is  necessary to determine patient infection status.  Positive results do  not rule out bacterial infection or co-infection with other viruses. If result is PRESUMPTIVE POSTIVE SARS-CoV-2 nucleic acids MAY BE PRESENT.   A presumptive positive result was obtained on the submitted specimen  and confirmed on repeat testing.  While 2019 novel coronavirus  (SARS-CoV-2) nucleic acids may be present in the submitted sample  additional confirmatory testing may be necessary for epidemiological  and / or clinical management purposes  to differentiate between  SARS-CoV-2 and other Sarbecovirus currently known to infect humans.  If clinically indicated additional testing with an alternate test  methodology [phone]) is advised. The SARS-CoV-2 RNA is generally  detectable in upper and lower  respiratory sp ecimens during the acute  phase of infection. The expected result is Negative. Fact Sheet for Patients:  StrictlyIdeas.no Fact Sheet for Healthcare Providers: BankingDealers.co.za This test is not yet approved or cleared by the [place] FDA and has been authorized for detection and/or diagnosis of SARS-CoV-2 by FDA under an Emergency Use Authorization (EUA).  This EUA will remain in effect (meaning this test can be used) for the duration of the COVID-19 declaration under Section 564(b)(1) of the Act, 21 U.S.C. section 360bbb-3(b)(1), unless the authorization is terminated or revoked sooner. Performed at [facility], [address] [place], [place] [address]   Blood Culture (routine x 2)     Status: None (Preliminary result)   Collection Time: May 21, 2018  4:51 AM  Result Value Ref Range Status   Specimen Description BLOOD RIGHT FOREARM  Final   Special Requests   Final    BOTTLES DRAWN AEROBIC AND ANAEROBIC Blood Culture  results may not be optimal due to an excessive volume of blood received in culture bottles   Culture   Final    NO GROWTH 2 DAYS Performed at [facility], [address] [place], [place] [address]    Report Status PENDING  Incomplete  Blood Culture (routine x 2)     Status: None (Preliminary result)   Collection Time: 11-17-18  4:51 AM  Result Value Ref Range Status   Specimen Description BLOOD RIGHT HAND  Final   Special Requests   Final    BOTTLES DRAWN AEROBIC AND ANAEROBIC Blood Culture results may not be optimal due to an inadequate volume of blood received in culture bottles   Culture   Final    NO GROWTH 2 DAYS Performed at [facility], [address] [place], [place] [address]    Report Status PENDING  Incomplete  MRSA PCR Screening     Status: None   Collection Time: 11-17-18  5:53 PM  Result Value Ref Range Status   MRSA by PCR NEGATIVE NEGATIVE Final     Comment:        The GeneXpert MRSA Assay (FDA approved for NASAL specimens only), is one component of a comprehensive MRSA colonization surveillance program. It is not intended to diagnose MRSA infection nor to guide or monitor treatment for MRSA infections. Performed at [facility], [address] [place], [place] [address]   Culture, sputum-assessment     Status: None   Collection Time: 11-17-18  8:00 PM  Result Value Ref Range Status   Specimen Description SPUTUM  Final   Special Requests NONE  Final   Sputum evaluation   Final    THIS SPECIMEN IS ACCEPTABLE FOR SPUTUM CULTURE Performed at [facility], [address] [place], [place] [address]    Report Status 11-17-2018 FINAL  Final  Culture, respiratory     Status: None (Preliminary result)   Collection Time: 11-17-18  8:00 PM  Result Value Ref Range Status   Specimen Description   Final    SPUTUM Performed at [facility], [address] [place], [place] [address]    Special Requests   Final    NONE Reflexed from [id number] Performed at [facility], [address] [place], [place] [address]    Gram Stain   Final    FEW SQUAMOUS EPITHELIAL CELLS PRESENT NO WBC SEEN FEW YEAST    Culture   Final    ABUNDANT YEAST CULTURE REINCUBATED FOR BETTER GROWTH Performed at [facility], [address] [place], [place] [address]    Report Status PENDING  Incomplete  Urine Culture     Status: None   Collection Time: 11-17-18  9:10 PM  Result Value Ref Range Status   Specimen Description   Final    URINE, CLEAN CATCH Performed at [facility], [address] [place], [place] [address]    Special Requests   Final    Normal Performed at [facility], [address] [place], [place] [address]    Culture   Final    NO GROWTH Performed at [facility], [address] [place], [place] [address]    Report Status 05/18/2018 FINAL  Final      Studies: Dg Chest Port 1 View  Result Date: 05/17/2018 CLINICAL DATA:  Acute respiratory failure. EXAM: PORTABLE CHEST 1 VIEW COMPARISON:  Radiographs of May 16, 2018. FINDINGS: Stable cardiomegaly. Atherosclerosis of thoracic  aorta is noted. Left-sided PICC line is unchanged in position. Stable diffuse interstitial densities are noted throughout both lungs concerning for edema or pneumonia. Stable left basilar atelectasis or infiltrate is noted with associated pleural effusion. Bony thorax is unremarkable. IMPRESSION: Stable bilateral diffuse interstitial densities are noted concerning for edema or pneumonia. Stable left basilar atelectasis or infiltrate is noted with associated pleural effusion. Electronically Signed   By: Marijo Conception M.D.   On: 05/17/2018 07:32    Scheduled Meds: . amiodarone  200 mg Oral BID  . aspirin EC  81 mg Oral Daily  . atorvastatin  80 mg Oral QHS  . chlorhexidine  15 mL Mouth Rinse BID  . clopidogrel  75 mg Oral Daily  . famotidine  40 mg Oral QPM  . FLUoxetine  20 mg Oral Daily  . fluticasone  1 spray Each Nare Daily  . guaiFENesin  600 mg Oral BID  . insulin aspart  0-15 Units Subcutaneous TID WC  . insulin aspart  0-5 Units Subcutaneous QHS  . ipratropium-albuterol  3 mL Nebulization Q6H  . levofloxacin  750 mg Oral q1800  . levothyroxine  88 mcg Oral Daily  . mouth rinse  15 mL Mouth Rinse q12n4p  . mirabegron ER  25 mg Oral Daily  . mometasone-formoterol  2 puff Inhalation BID  . montelukast  10 mg Oral Daily  . [START ON 05/21/2018] tiotropium  1 capsule Inhalation Daily  . traZODone  150 mg Oral QHS  . Warfarin - Pharmacist Dosing Inpatient   Does not apply q1800   Continuous Infusions: . vancomycin      Assessment/Plan:  1. Acute on chronic hypoxic respiratory failure.  The patient is been in the ICU on BiPAP.  Just converted over to high flow nasal cannula today 30 per 3% FiO2. 2. Acute on chronic diastolic congestive heart failure.   Patient given Lasix last night and again this morning. 3. Healthcare associated pneumonia on vancomycin and Levaquin 4. COPD exacerbation.  On nebulizer therapy 5. MSSA bacteremia and discitis.  Antibiotics switched over to vancomycin for now. 6. Rash.  Possible erythema multiforme.  Beta lactams discontinued.  Observe rash 7. Elevated troponin likely demand ischemia from acute hypoxic respiratory failure and CHF 8. History of stroke and peripheral vascular disease therapeutic on Coumadin 9. Hypothyroidism unspecified on levothyroxine 10. Hyperlipidemia unspecified on atorvastatin 11. Depression on Prozac 12. Type 2 diabetes mellitus.  Last hemoglobin A1c 9.6.  On sliding scale  Code Status:     Code Status Orders  (From admission, onward)         Start     Ordered   06-08-18 0703  Full code  Continuous     06-08-2018 0706        Code Status History    Date Active Date Inactive Code Status Order ID Comments User Context   Jun 08, 2018 0437 Jun 08, 2018 0706 Full Code [id number]  Hinda Kehr, MD ED   04/20/2018 1926 04/27/2018 1843 Full Code [id number]  Bradly Bienenstock, NP Inpatient   04/20/2018 1903 04/20/2018 1926 Full Code [id number]  Henreitta Leber, MD Inpatient    Advance Directive Documentation     Most Recent Value  Type of Advance Directive  Living will  Pre-existing out of facility DNR order (yellow form or pink MOST form)  -  "MOST" Form in Place?  -     Family Communication: As per critical care specialist Disposition Plan: To be determined  Consultants:  Critical care  specialist  Infectious disease  Antibiotics:  Vancomycin  Levaquin  Time spent: 27 minutes  Lenoard Helbert [employer]

## 2018-05-18 NOTE — Consult Note (Signed)
ANTICOAGULATION CONSULT NOTE  Pharmacy Consult for Warfarin Indication: Previous DVT  Allergies  Allergen Reactions  . Skin Protectants, Misc. Rash  . Flagyl [Metronidazole]   . Gabapentin   . Levetiracetam   . Tape Other (See Comments)    blisters  . Ace Inhibitors Rash  . Cefazolin Rash    Rash developed on cefazolin while on long-term treatment (was on 3-4wks)  . Ertapenem Rash  . Sertraline Hcl Rash    Patient Measurements: Height: 5\' 3"  (160 cm) Weight: 158 lb 11.7 oz (72 kg) IBW/kg (Calculated) : 52.4   Vital Signs: Temp: 97.9 F (36.6 C) (04/29 1130) Temp Source: Oral (04/29 1130) BP: 108/58 (04/29 1430) Pulse Rate: 84 (04/29 1430)  Labs: Recent Labs    04/21/2018 0428  05/01/2018 2317 05/17/18 0642 05/17/18 1116 05/18/18 0429  HGB 9.6*  --   --  9.0*  --  8.0*  HCT 33.1*  --   --  30.6*  --  27.4*  PLT 534*  --   --  524*  --  403*  LABPROT 21.7*  --   --  27.7*  --  37.3*  INR 1.9*  --   --  2.6*  --  3.9*  CREATININE 1.02*  --   --  1.09*  --  0.73  TROPONINI 0.31*   < > 0.86* 0.94* 0.76*  --    < > = values in this interval not displayed.    Estimated Creatinine Clearance: 73.7 mL/min (by C-G formula based on SCr of 0.73 mg/dL).   Medical History: Past Medical History:  Diagnosis Date  . Asthma   . CHF (congestive heart failure) (HCC)   . Chronic pain   . COPD (chronic obstructive pulmonary disease) (HCC)   . Diabetes mellitus (HCC)   . Peripheral vascular disease (HCC)   . 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 Patient takes amiodarone 200 mg PO BID PTA (continued inpatient).  Assessment: 58 y.o. female admitted on 05/13/2018 with COPD exacerbation. Patient was recently discharged with pneumonia and received cefazolin via PICC at home. Patient antibiotic regimen changed to levofloxacin and vancomycin.   Goal of Therapy:  INR 2-3 Monitor platelets by  anticoagulation protocol: Yes   Plan:  4/27 INR 1.9 Ordered Warfarin 8.5 mg 4/28 INR 2.6 Held 4/29 INR 3.9, hold warfarin   Will continue to hold warfarin. Will obtain INR with am labs.   Pharmacy will continue to monitor  Alexandra Goodman L 05/18/2018 4:35 PM

## 2018-05-18 NOTE — Progress Notes (Signed)
PHARMACY CONSULT NOTE - FOLLOW UP  Pharmacy Consult for Electrolyte Monitoring and Replacement   Recent Labs: Potassium (mmol/L)  Date Value  05/18/2018 4.0  01/15/2013 4.2   Magnesium (mg/dL)  Date Value  23/53/6144 2.2  01/14/2013 1.8   Calcium (mg/dL)  Date Value  31/54/0086 8.1 (L)   Calcium, Total (mg/dL)  Date Value  76/19/5093 8.8   Albumin (g/dL)  Date Value  26/71/2458 2.4 (L)  01/14/2013 2.4 (L)   Phosphorus (mg/dL)  Date Value  09/98/3382 3.6   Sodium (mmol/L)  Date Value  05/18/2018 141  01/15/2013 137     Assessment: Patient admitted with respiratory failure/COPD exacerbation.  Goal of Therapy:  Potassium ~4.0, Magnesium ~2.0  Plan:  No replacement warranted.   Will obtain BMP with am labs.   Pharmacy will continue to monitor and adjust per consult.   Simpson,Michael L 05/18/2018 4:37 PM

## 2018-05-19 ENCOUNTER — Inpatient Hospital Stay: Payer: Medicare Other

## 2018-05-19 ENCOUNTER — Other Ambulatory Visit: Payer: Self-pay

## 2018-05-19 LAB — BRAIN NATRIURETIC PEPTIDE: B Natriuretic Peptide: 1472 pg/mL — ABNORMAL HIGH (ref 0.0–100.0)

## 2018-05-19 LAB — BASIC METABOLIC PANEL
Anion gap: 4 — ABNORMAL LOW (ref 5–15)
BUN: 26 mg/dL — ABNORMAL HIGH (ref 6–20)
CO2: 29 mmol/L (ref 22–32)
Calcium: 8 mg/dL — ABNORMAL LOW (ref 8.9–10.3)
Chloride: 104 mmol/L (ref 98–111)
Creatinine, Ser: 0.77 mg/dL (ref 0.44–1.00)
GFR calc Af Amer: >60 mL/min (ref >60)
GFR calc non Af Amer: >60 mL/min (ref >60)
Glucose, Bld: 185 mg/dL — ABNORMAL HIGH (ref 70–99)
Potassium: 4.8 mmol/L (ref 3.5–5.1)
Sodium: 137 mmol/L (ref 135–145)

## 2018-05-19 LAB — GLUCOSE, CAPILLARY
Glucose-Capillary: 162 mg/dL — ABNORMAL HIGH (ref 70–99)
Glucose-Capillary: 202 mg/dL — ABNORMAL HIGH (ref 70–99)
Glucose-Capillary: 208 mg/dL — ABNORMAL HIGH (ref 70–99)
Glucose-Capillary: 182 mg/dL — ABNORMAL HIGH (ref 70–99)

## 2018-05-19 LAB — PROTIME-INR
INR: 2.4 — ABNORMAL HIGH (ref 0.8–1.2)
Prothrombin Time: 25.7 seconds — ABNORMAL HIGH (ref 11.4–15.2)

## 2018-05-19 LAB — CULTURE, RESPIRATORY W GRAM STAIN

## 2018-05-19 LAB — CBC
HCT: 27 % — ABNORMAL LOW (ref 36.0–46.0)
Hemoglobin: 7.9 g/dL — ABNORMAL LOW (ref 12.0–15.0)
MCH: 26.1 pg (ref 26.0–34.0)
MCHC: 29.3 g/dL — ABNORMAL LOW (ref 30.0–36.0)
MCV: 89.1 fL (ref 80.0–100.0)
Platelets: 361 10*3/uL (ref 150–400)
RBC: 3.03 MIL/uL — ABNORMAL LOW (ref 3.87–5.11)
RDW: 22.1 % — ABNORMAL HIGH (ref 11.5–15.5)
WBC: 4.8 10*3/uL (ref 4.0–10.5)
nRBC: 0 % (ref 0.0–0.2)

## 2018-05-19 MED ORDER — FUROSEMIDE 10 MG/ML IJ SOLN
20.0000 mg | Freq: Once | INTRAMUSCULAR | Status: AC
  Administered 2018-05-19: 06:00:00 20 mg via INTRAVENOUS
  Filled 2018-05-19: qty 2

## 2018-05-19 MED ORDER — VANCOMYCIN HCL 10 G IV SOLR
1500.0000 mg | INTRAVENOUS | Status: DC
  Administered 2018-05-20: 08:00:00 1500 mg via INTRAVENOUS
  Filled 2018-05-19: qty 1500

## 2018-05-19 MED ORDER — FUROSEMIDE 10 MG/ML IJ SOLN
40.0000 mg | Freq: Two times a day (BID) | INTRAMUSCULAR | Status: DC
  Administered 2018-05-19 (×2): 40 mg via INTRAVENOUS
  Filled 2018-05-19 (×2): qty 4

## 2018-05-19 MED ORDER — WARFARIN SODIUM 2 MG PO TABS
3.0000 mg | ORAL_TABLET | Freq: Once | ORAL | Status: AC
  Administered 2018-05-19: 3 mg via ORAL
  Filled 2018-05-19: qty 1

## 2018-05-19 MED ORDER — WARFARIN SODIUM 3 MG PO TABS
3.0000 mg | ORAL_TABLET | Freq: Once | ORAL | Status: DC
  Filled 2018-05-19: qty 1

## 2018-05-19 NOTE — Progress Notes (Signed)
Bedside spirometry performed. FVC 1.2, 41% of predicted and the FEV1 1.03, 43% of predicted.

## 2018-05-19 NOTE — Progress Notes (Signed)
PHARMACY CONSULT NOTE - FOLLOW UP  Pharmacy Consult for Electrolyte Monitoring and Replacement   Recent Labs: Potassium (mmol/L)  Date Value  05/19/2018 4.8  01/15/2013 4.2   Magnesium (mg/dL)  Date Value  50/75/7322 2.2  01/14/2013 1.8   Calcium (mg/dL)  Date Value  56/72/0919 8.0 (L)   Calcium, Total (mg/dL)  Date Value  80/22/1798 8.8   Albumin (g/dL)  Date Value  11/12/4860 2.4 (L)  01/14/2013 2.4 (L)   Phosphorus (mg/dL)  Date Value  82/41/7530 3.6   Sodium (mmol/L)  Date Value  05/19/2018 137  01/15/2013 137     Assessment: Patient admitted with respiratory failure/COPD exacerbation.  Goal of Therapy:  Potassium ~4.0, Magnesium ~2.0  Plan:  No replacement warranted.   Will obtain BMP with am labs.   Pharmacy will continue to monitor and adjust per consult.    Mauri Reading, PharmD Pharmacy Resident  05/19/2018 1:56 PM

## 2018-05-19 NOTE — Progress Notes (Signed)
Updated pts daughter Shantice Schepps via telephone regarding plan of care and all questions were answered.  Will continue to monitor and assess pt  Sonda Rumble, [facility]  Pulmonary/Critical Care Pager [phone] (please enter 7 digits) PCCM Consult Pager [phone] (please enter 7 digits)

## 2018-05-19 NOTE — Progress Notes (Signed)
Inpatient Diabetes Program Recommendations  AACE/ADA: New Consensus Statement on Inpatient Glycemic Control  Target Ranges:  Prepandial:   less than 140 mg/dL      Peak postprandial:   less than 180 mg/dL (1-2 hours)      Critically ill patients:  140 - 180 mg/dL   Results for Alexandra Goodman, Alexandra Goodman (MRN [id number]) as of 05/19/2018 08:17  Ref. Range 05/18/2018 07:40 05/18/2018 11:48 05/18/2018 16:06 05/18/2018 21:22 05/19/2018 07:43  Glucose-Capillary Latest Ref Range: 70 - 99 mg/dL 935 (H) 701 (H) 779 (H) 265 (H) 162 (H)   Review of Glycemic Control  Diabetes history: DM2 Outpatient Diabetes medications: Lantus 46 units QHS, Metformin 1000 mg BID Current orders for Inpatient glycemic control: Novolog 0-15 units TID with meals, Novolog 0-5 units QHS  Inpatient Diabetes Program Recommendations:   Insulin-Meal Coverage: Please consider ordering Novolog 3 units TID with meals for meal coverage if patient is eating at least 50% of meals.  Thanks, Orlando Penner, RN, MSN, CDE Diabetes Coordinator Inpatient Diabetes Program [phone] (Team Pager from 8am to 5pm)

## 2018-05-19 NOTE — Progress Notes (Signed)
Patient ID: Alexandra Goodman, female   DOB: 01-01-1961, 58 y.o.   MRN: [id number] [facility] CLINIC INFECTIOUS DISEASE PROGRESS NOTE Date of Admission:  04/29/2018     ID: Alexandra Goodman is a 58 y.o. female with MSSA bacteremia, Acute resp failure/COPD, rash  Active Problems:   Acute exacerbation of chronic obstructive pulmonary disease (COPD) (HCC)   Pneumonia   Subjective: No fevers, off hi flo. Rash is stable. Still co back pain and buttock pain at decub site.  ROS  11 systems reviewed and negative except as per HPI  Medications:  Antibiotics Given (last 72 hours)    Date/Time Action Medication Dose Rate   04/30/2018 1527 New Bag/Given   azithromycin (ZITHROMAX) 500 mg in sodium chloride 0.9 % 250 mL IVPB 500 mg 250 mL/hr   05/17/18 0635 New Bag/Given   vancomycin (VANCOCIN) 1,250 mg in sodium chloride 0.9 % 250 mL IVPB 1,250 mg 166.7 mL/hr   05/17/18 1451 New Bag/Given   levofloxacin (LEVAQUIN) IVPB 750 mg 750 mg 100 mL/hr   05/18/18 1831 Given   levofloxacin (LEVAQUIN) tablet 750 mg 750 mg    05/18/18 2104 New Bag/Given   vancomycin (VANCOCIN) 1,750 mg in sodium chloride 0.9 % 500 mL IVPB 1,750 mg 250 mL/hr     . amiodarone  200 mg Oral BID  . aspirin EC  81 mg Oral Daily  . atorvastatin  80 mg Oral QHS  . chlorhexidine  15 mL Mouth Rinse BID  . clopidogrel  75 mg Oral Daily  . famotidine  40 mg Oral QPM  . FLUoxetine  20 mg Oral Daily  . fluticasone  1 spray Each Nare Daily  . furosemide  40 mg Intravenous Q12H  . guaiFENesin  600 mg Oral BID  . insulin aspart  0-15 Units Subcutaneous TID WC  . insulin aspart  0-5 Units Subcutaneous QHS  . ipratropium-albuterol  3 mL Nebulization Q6H  . levofloxacin  750 mg Oral q1800  . levothyroxine  88 mcg Oral Daily  . mouth rinse  15 mL Mouth Rinse q12n4p  . mirabegron ER  25 mg Oral Daily  . mometasone-formoterol  2 puff Inhalation BID  . montelukast  10 mg Oral Daily  . [START ON 05/21/2018] tiotropium  1 capsule Inhalation Daily   . traZODone  150 mg Oral QHS  . Warfarin - Pharmacist Dosing Inpatient   Does not apply q1800    Objective: Vital signs in last 24 hours: Temp:  [97.7 F (36.5 C)-98.2 F (36.8 C)] 98.2 F (36.8 C) (04/30 0800) Pulse Rate:  [75-116] 81 (04/30 1000) Resp:  [11-24] 11 (04/30 1000) BP: (80-145)/(28-81) 113/67 (04/30 0930) SpO2:  [83 %-99 %] 92 % (04/30 1000) FiO2 (%):  [35 %-70 %] 50 % (04/30 0200) Constitutional:  Awake and interactive, disheveled. On high flow O2 HENT: [place]/AT, PERRLA, no scleral icterus Mouth/Throat: Oropharynx is clear and moist. No oropharyngeal exudate.  Cardiovascular: Normal rate, regular rhythm and normal heart sounds. Pulmonary/Chest:  still poor air movement.  Mild wheeze. Neck = supple, no nuchal rigidity Abdominal: Soft. Bowel sounds are normal.  exhibits no distension. There is no tenderness.  Lymphadenopathy: no cervical adenopathy. No axillary adenopathy Neurological: alert and oriented to person, place, and time.  Skin: on her arms and legs she has numerous bright red, almost targetoid lesions. No drainage, or purulence. Spares palms and soles.  Psychiatric: flat affect, slowed mentation   Lab Results Recent Labs    05/18/18 0429 05/19/18 0109  WBC 5.3 4.8  HGB 8.0* 7.9*  HCT 27.4* 27.0*  NA 141 137  K 4.0 4.8  CL 107 104  CO2 27 29  BUN 28* 26*  CREATININE 0.73 0.77    Microbiology: Results for orders placed or performed during the hospital encounter of Jun 05, 2018  SARS Coronavirus 2 [facility] order, Performed in [place] hospital lab)     Status: None   Collection Time: 06/05/2018  4:28 AM  Result Value Ref Range Status   SARS Coronavirus 2 NEGATIVE NEGATIVE Final    Comment: (NOTE) If result is NEGATIVE SARS-CoV-2 target nucleic acids are NOT DETECTED. The SARS-CoV-2 RNA is generally detectable in upper and lower  respiratory specimens during the acute phase of infection. The lowest  concentration of SARS-CoV-2 viral copies this  assay can detect is 250  copies / mL. A negative result does not preclude SARS-CoV-2 infection  and should not be used as the sole basis for treatment or other  patient management decisions.  A negative result may occur with  improper specimen collection / handling, submission of specimen other  than nasopharyngeal swab, presence of viral mutation(s) within the  areas targeted by this assay, and inadequate number of viral copies  (<250 copies / mL). A negative result must be combined with clinical  observations, patient history, and epidemiological information. If result is POSITIVE SARS-CoV-2 target nucleic acids are DETECTED. The SARS-CoV-2 RNA is generally detectable in upper and lower  respiratory specimens dur ing the acute phase of infection.  Positive  results are indicative of active infection with SARS-CoV-2.  Clinical  correlation with patient history and other diagnostic information is  necessary to determine patient infection status.  Positive results do  not rule out bacterial infection or co-infection with other viruses. If result is PRESUMPTIVE POSTIVE SARS-CoV-2 nucleic acids MAY BE PRESENT.   A presumptive positive result was obtained on the submitted specimen  and confirmed on repeat testing.  While 2019 novel coronavirus  (SARS-CoV-2) nucleic acids may be present in the submitted sample  additional confirmatory testing may be necessary for epidemiological  and / or clinical management purposes  to differentiate between  SARS-CoV-2 and other Sarbecovirus currently known to infect humans.  If clinically indicated additional testing with an alternate test  methodology [phone]) is advised. The SARS-CoV-2 RNA is generally  detectable in upper and lower respiratory sp ecimens during the acute  phase of infection. The expected result is Negative. Fact Sheet for Patients:  StrictlyIdeas.no Fact Sheet for Healthcare  Providers: BankingDealers.co.za This test is not yet approved or cleared by the [place] FDA and has been authorized for detection and/or diagnosis of SARS-CoV-2 by FDA under an Emergency Use Authorization (EUA).  This EUA will remain in effect (meaning this test can be used) for the duration of the COVID-19 declaration under Section 564(b)(1) of the Act, 21 U.S.C. section 360bbb-3(b)(1), unless the authorization is terminated or revoked sooner. Performed at [facility], [address] [place], [place] [address]   Blood Culture (routine x 2)     Status: None (Preliminary result)   Collection Time: Jun 05, 2018  4:51 AM  Result Value Ref Range Status   Specimen Description BLOOD RIGHT FOREARM  Final   Special Requests   Final    BOTTLES DRAWN AEROBIC AND ANAEROBIC Blood Culture results may not be optimal due to an excessive volume of blood received in culture bottles   Culture   Final    NO GROWTH 3 DAYS Performed  at [facility], [address], [place], [place] [address]    Report Status PENDING  Incomplete  Blood Culture (routine x 2)     Status: None (Preliminary result)   Collection Time: 10-Jun-2018  4:51 AM  Result Value Ref Range Status   Specimen Description BLOOD RIGHT HAND  Final   Special Requests   Final    BOTTLES DRAWN AEROBIC AND ANAEROBIC Blood Culture results may not be optimal due to an inadequate volume of blood received in culture bottles   Culture   Final    NO GROWTH 3 DAYS Performed at [facility], [address] [place], [place] [address]    Report Status PENDING  Incomplete  MRSA PCR Screening     Status: None   Collection Time: 06-10-18  5:53 PM  Result Value Ref Range Status   MRSA by PCR NEGATIVE NEGATIVE Final    Comment:        The GeneXpert MRSA Assay (FDA approved for NASAL specimens only), is one component of a comprehensive MRSA colonization surveillance program. It is not intended to  diagnose MRSA infection nor to guide or monitor treatment for MRSA infections. Performed at [facility], [address] [place], [place] [address]   Culture, sputum-assessment     Status: None   Collection Time: 06-10-18  8:00 PM  Result Value Ref Range Status   Specimen Description SPUTUM  Final   Special Requests NONE  Final   Sputum evaluation   Final    THIS SPECIMEN IS ACCEPTABLE FOR SPUTUM CULTURE Performed at [facility], [address], [place], [place] [address]    Report Status 10-Jun-2018 FINAL  Final  Culture, respiratory     Status: None   Collection Time: 06-10-18  8:00 PM  Result Value Ref Range Status   Specimen Description   Final    SPUTUM Performed at [facility], [address], [place], [place] [address]    Special Requests   Final    NONE Reflexed from [id number] Performed at [facility], [address] [place], [place] [address]    Gram Stain   Final    FEW SQUAMOUS EPITHELIAL CELLS PRESENT NO WBC SEEN FEW YEAST Performed at [facility], [place] [address], [place], [place] [address]    Culture   Final    ABUNDANT CANDIDA ALBICANS FEW CANDIDA TROPICALIS    Report Status 05/19/2018 FINAL  Final  Urine Culture     Status: None   Collection Time: 06/10/2018  9:10 PM  Result Value Ref Range Status   Specimen Description   Final    URINE, CLEAN CATCH Performed at [facility], [address], [place], [place] [address]    Special Requests   Final    Normal Performed at [facility], [address], [place], [place] [address]    Culture   Final    NO GROWTH Performed at [facility], [place] [address] [place],  [address]    Report Status 05/18/2018 FINAL  Final    Studies/Results: Dg Chest Port 1 View  Result Date: 05/19/2018 CLINICAL DATA:  57 year old female with respiratory failure. Recently negative for COVID-19. EXAM: PORTABLE CHEST 1 VIEW COMPARISON:  05/17/2018  and earlier. FINDINGS: Portable AP semi upright view at 0233 hours. Stable left PICC line. Increased veiling opacity at both lung bases and dense retrocardiac opacity. Coarse superimposed pulmonary interstitial opacity in both lungs with progression from  earlier this month but not significantly changed from 06/06/18. No pneumothorax. Stable cardiac size and mediastinal contours. Negative visible bowel gas pattern. IMPRESSION: 1. Increasing bilateral pleural effusions suspected with lower lobe collapse or consolidation. 2. Diffuse coarse pulmonary interstitial opacity might reflect edema and is stable since 06/06/18. Electronically Signed   By: Genevie Ann M.D.   On: 05/19/2018 02:53    Assessment/Plan: KITT MINARDI is a 58 y.o. 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 CXR with interstitial CHF pattern on top of chronic COPD and possible new lung infiltrates.  Neg COVID, flu.  Jenner ngtd. She has an impressive rash which has a erythema multiforme appearance and is likely due to the cefazolin.  She has no oral lesions I have noted.  April 29- she is clinically improving from a respiratory point of view and is off BiPAP onto high flow.  Her rash is improving. 4/ 30 - no fevers, off high flo o2 Recommendations Her rash is improving since stopping beta-lactam use.  We will not plan to restart them.  . Cont vanco for the MSSA bacteremia.  We could consider using once a day daptomycin at discharge. Levofloxacin for CAP type picture as well.  Can finish a 7-day total course for HCAP - stop date 5/2 Continue management of COPD and CHF exacerbation per pulmonary.   ID will follow from afar and can assist with dc planning when needed.  Thank you very much for the consult. Will follow with you.  Leonel Ramsay   05/19/2018, 1:22 PM

## 2018-05-19 NOTE — Progress Notes (Signed)
Patient ID: Alexandra AlaminMary E Goodman, female   DOB: 01/29/1960, 58 y.o.   MRN: [id number]  [facility] PROGRESS NOTE  Alexandra Goodman WUJ:[id number] DOB: 02/03/1960 DOA: 05/02/2018 PCP: Floreen ComberKimel-Scott, Karen, MD  HPI/Subjective: Patient feels like she is breathing better.  Patient on high flow nasal cannula at 10 L.  Some cough.  Hoping to sit up in the bed and have her legs hanging over the side.  Objective: Vitals:   05/19/18 0930 05/19/18 1000  BP: 113/67   Pulse: 88 81  Resp: 11 11  Temp:    SpO2: (!) 83% 92%    Filed Weights   05/07/2018 0424 04/21/2018 0900  Weight: 66 kg 72 kg    ROS: Review of Systems  Constitutional: Negative for chills and fever.  Eyes: Negative for blurred vision.  Respiratory: Positive for cough, shortness of breath and wheezing.   Cardiovascular: Negative for chest pain.  Gastrointestinal: Negative for abdominal pain, constipation, diarrhea, nausea and vomiting.  Genitourinary: Negative for dysuria.  Musculoskeletal: Positive for back pain. Negative for joint pain.  Skin: Positive for rash.  Neurological: Negative for dizziness and headaches.  Psychiatric/Behavioral: The patient is nervous/anxious.    Exam: Physical Exam  Constitutional: She is oriented to person, place, and time.  HENT:  Nose: No mucosal edema.  Mouth/Throat: No oropharyngeal exudate or posterior oropharyngeal edema.  Eyes: Pupils are equal, round, and reactive to light. Conjunctivae, EOM and lids are normal.  Neck: No JVD present. Carotid bruit is not present. No edema present. No thyroid mass and no thyromegaly present.  Cardiovascular: S1 normal and S2 normal. Exam reveals no gallop.  No murmur heard. Pulses:      Dorsalis pedis pulses are 2+ on the right side and 2+ on the left side.  Respiratory: No accessory muscle usage. No respiratory distress. She has decreased breath sounds in the right lower field and the left lower field. She has no wheezes. She has rhonchi in the right  lower field and the left lower field. She has no rales.  GI: Soft. Bowel sounds are normal. There is no abdominal tenderness.  Musculoskeletal:     Left knee: She exhibits no swelling.     Right ankle: She exhibits no swelling.  Lymphadenopathy:    She has no cervical adenopathy.  Neurological: She is alert and oriented to person, place, and time. No cranial nerve deficit.  Skin: Skin is warm. Nails show no clubbing.  Dark palpable purpura rash on arms.  Also having a little bit on the legs.  Psychiatric: She has a normal mood and affect.      Data Reviewed: Basic Metabolic Panel: Recent Labs  Lab 05/15/2018 0428 05/17/18 0642 05/18/18 0429 05/19/18 0412  NA 136 137 141 137  K 4.5 4.5 4.0 4.8  CL 97* 102 107 104  CO2 26 28 27 29   GLUCOSE 190* 151* 56* 185*  BUN 31* 35* 28* 26*  CREATININE 1.02* 1.09* 0.73 0.77  CALCIUM 8.6* 8.6* 8.1* 8.0*  MG 1.6*  --  2.2  --   PHOS  --   --  3.6  --    Liver Function Tests: Recent Labs  Lab 05/14/2018 0428  AST 16  ALT <5  ALKPHOS 138*  BILITOT 0.5  PROT 6.5  ALBUMIN 2.4*   CBC: Recent Labs  Lab 04/25/2018 0428 05/17/18 0642 05/18/18 0429 05/19/18 0412  WBC 14.7* 8.7 5.3 4.8  NEUTROABS 13.1*  --   --   --   HGB  9.6* 9.0* 8.0* 7.9*  HCT 33.1* 30.6* 27.4* 27.0*  MCV 89.7 90.0 89.8 89.1  PLT 534* 524* 403* 361   Cardiac Enzymes: Recent Labs  Lab 05/14/2018 0735 05/13/2018 1753 04/29/2018 2317 05/17/18 0642 05/17/18 1116  TROPONINI 0.39* 0.66* 0.86* 0.94* 0.76*   BNP (last 3 results) Recent Labs    04/22/2018 0428 05/18/18 0429 05/19/18 0412  BNP 852.0* 1,105.0* 1,472.0*     CBG: Recent Labs  Lab 05/18/18 1148 05/18/18 1606 05/18/18 2122 05/19/18 0743 05/19/18 1136  GLUCAP 298* 186* 265* 162* 202*    Recent Results (from the past 240 hour(s))  SARS Coronavirus 2 [facility] order, Performed in [place] hospital lab)     Status: None   Collection Time: 05/10/2018  4:28 AM  Result Value Ref Range Status    SARS Coronavirus 2 NEGATIVE NEGATIVE Final    Comment: (NOTE) If result is NEGATIVE SARS-CoV-2 target nucleic acids are NOT DETECTED. The SARS-CoV-2 RNA is generally detectable in upper and lower  respiratory specimens during the acute phase of infection. The lowest  concentration of SARS-CoV-2 viral copies this assay can detect is 250  copies / mL. A negative result does not preclude SARS-CoV-2 infection  and should not be used as the sole basis for treatment or other  patient management decisions.  A negative result may occur with  improper specimen collection / handling, submission of specimen other  than nasopharyngeal swab, presence of viral mutation(s) within the  areas targeted by this assay, and inadequate number of viral copies  (<250 copies / mL). A negative result must be combined with clinical  observations, patient history, and epidemiological information. If result is POSITIVE SARS-CoV-2 target nucleic acids are DETECTED. The SARS-CoV-2 RNA is generally detectable in upper and lower  respiratory specimens dur ing the acute phase of infection.  Positive  results are indicative of active infection with SARS-CoV-2.  Clinical  correlation with patient history and other diagnostic information is  necessary to determine patient infection status.  Positive results do  not rule out bacterial infection or co-infection with other viruses. If result is PRESUMPTIVE POSTIVE SARS-CoV-2 nucleic acids MAY BE PRESENT.   A presumptive positive result was obtained on the submitted specimen  and confirmed on repeat testing.  While 2019 novel coronavirus  (SARS-CoV-2) nucleic acids may be present in the submitted sample  additional confirmatory testing may be necessary for epidemiological  and / or clinical management purposes  to differentiate between  SARS-CoV-2 and other Sarbecovirus currently known to infect humans.  If clinically indicated additional testing with an alternate test   methodology [phone]) is advised. The SARS-CoV-2 RNA is generally  detectable in upper and lower respiratory sp ecimens during the acute  phase of infection. The expected result is Negative. Fact Sheet for Patients:  StrictlyIdeas.no Fact Sheet for Healthcare Providers: BankingDealers.co.za This test is not yet approved or cleared by the [place] FDA and has been authorized for detection and/or diagnosis of SARS-CoV-2 by FDA under an Emergency Use Authorization (EUA).  This EUA will remain in effect (meaning this test can be used) for the duration of the COVID-19 declaration under Section 564(b)(1) of the Act, 21 U.S.C. section 360bbb-3(b)(1), unless the authorization is terminated or revoked sooner. Performed at [facility], [address] [place], [place] [address]   Blood Culture (routine x 2)     Status: None (Preliminary result)   Collection Time: 04/21/2018  4:51 AM  Result Value Ref Range Status  Specimen Description BLOOD RIGHT FOREARM  Final   Special Requests   Final    BOTTLES DRAWN AEROBIC AND ANAEROBIC Blood Culture results may not be optimal due to an excessive volume of blood received in culture bottles   Culture   Final    NO GROWTH 3 DAYS Performed at [facility], [address], [place], [place] [address]    Report Status PENDING  Incomplete  Blood Culture (routine x 2)     Status: None (Preliminary result)   Collection Time: 06-13-18  4:51 AM  Result Value Ref Range Status   Specimen Description BLOOD RIGHT HAND  Final   Special Requests   Final    BOTTLES DRAWN AEROBIC AND ANAEROBIC Blood Culture results may not be optimal due to an inadequate volume of blood received in culture bottles   Culture   Final    NO GROWTH 3 DAYS Performed at [facility], [address], [place], [place] [address]    Report Status PENDING  Incomplete  MRSA PCR Screening     Status: None    Collection Time: 2018/06/13  5:53 PM  Result Value Ref Range Status   MRSA by PCR NEGATIVE NEGATIVE Final    Comment:        The GeneXpert MRSA Assay (FDA approved for NASAL specimens only), is one component of a comprehensive MRSA colonization surveillance program. It is not intended to diagnose MRSA infection nor to guide or monitor treatment for MRSA infections. Performed at [facility], [address] [place], [place] [address]   Culture, sputum-assessment     Status: None   Collection Time: 13-Jun-2018  8:00 PM  Result Value Ref Range Status   Specimen Description SPUTUM  Final   Special Requests NONE  Final   Sputum evaluation   Final    THIS SPECIMEN IS ACCEPTABLE FOR SPUTUM CULTURE Performed at [facility], [address] [place], [place] [address]    Report Status 06/13/2018 FINAL  Final  Culture, respiratory     Status: None   Collection Time: 06-13-2018  8:00 PM  Result Value Ref Range Status   Specimen Description   Final    SPUTUM Performed at [facility], [address], [place], [place] [address]    Special Requests   Final    NONE Reflexed from [id number] Performed at [facility], [address] [place], [place] [address]    Gram Stain   Final    FEW SQUAMOUS EPITHELIAL CELLS PRESENT NO WBC SEEN FEW YEAST Performed at [facility], [address] [place], [place] [address]    Culture   Final    ABUNDANT CANDIDA ALBICANS FEW CANDIDA TROPICALIS    Report Status 05/19/2018 FINAL  Final  Urine Culture     Status: None   Collection Time: 06-13-18  9:10 PM  Result Value Ref Range Status   Specimen Description   Final    URINE, CLEAN CATCH Performed at [facility], [address], [place], [place] [address]    Special Requests   Final    Normal Performed at [facility], [address], [place], [place] [address]    Culture   Final    NO GROWTH Performed at [facility],  [address], [place], [place] [address]    Report Status 05/18/2018 FINAL  Final     Studies: Dg Chest Port 1 View  Result Date: 05/19/2018 CLINICAL DATA:  58 year old female with respiratory failure. Recently negative for COVID-19. EXAM: PORTABLE CHEST 1 VIEW COMPARISON:  05/17/2018 and earlier. FINDINGS: Portable AP semi upright view at 0233 hours. Stable left PICC line. Increased veiling opacity at both lung bases and dense retrocardiac opacity. Coarse superimposed pulmonary interstitial opacity in both lungs with progression from earlier this month but not significantly changed from 05/07/2018. No pneumothorax. Stable cardiac size and mediastinal contours. Negative visible bowel gas pattern. IMPRESSION: 1. Increasing bilateral pleural effusions suspected with lower lobe collapse or consolidation. 2. Diffuse coarse pulmonary interstitial opacity might reflect edema and is stable since 04/22/2018. Electronically Signed   By: [place] Fleming M.D.   On: 05/19/2018 02:53    Scheduled Meds: . amiodarone  200 mg Oral BID  . aspirin EC  81 mg Oral Daily  . atorvastatin  80 mg Oral QHS  . chlorhexidine  15 mL Mouth Rinse BID  . clopidogrel  75 mg Oral Daily  . famotidine  40 mg Oral QPM  . FLUoxetine  20 mg Oral Daily  . fluticasone  1 spray Each Nare Daily  . furosemide  40 mg Intravenous Q12H  . guaiFENesin  600 mg Oral BID  . insulin aspart  0-15 Units Subcutaneous TID WC  . insulin aspart  0-5 Units Subcutaneous QHS  . ipratropium-albuterol  3 mL Nebulization Q6H  . levofloxacin  750 mg Oral q1800  . levothyroxine  88 mcg Oral Daily  . mouth rinse  15 mL Mouth Rinse q12n4p  . mirabegron ER  25 mg Oral Daily  . mometasone-formoterol  2 puff Inhalation BID  . montelukast  10 mg Oral Daily  . [START ON 05/21/2018] tiotropium  1 capsule Inhalation Daily  . traZODone  150 mg Oral QHS  . Warfarin - Pharmacist Dosing Inpatient   Does not apply q1800   Continuous Infusions: . vancomycin 250 mL/hr at  05/18/18 2200    Assessment/Plan:  1. Acute on chronic hypoxic respiratory failure.  The patient still requiring high flow nasal cannula 10 L.  On BiPAP at night.  Respiratory status still tenuous. 2. Acute on chronic diastolic congestive heart failure.  Patient given Lasix last night and again this morning. 3. Healthcare associated pneumonia on vancomycin and Levaquin.  4. COPD exacerbation.  On nebulizer therapy.  5. MSSA bacteremia and discitis.  Antibiotics switched over to vancomycin. 6. Rash.  Possible erythema multiforme.  Beta lactams discontinued.  Observe rash.  7. Elevated troponin likely demand ischemia from acute hypoxic respiratory failure and CHF 8. History of stroke and peripheral vascular disease therapeutic on Coumadin 9. Hypothyroidism unspecified on levothyroxine 10. Hyperlipidemia unspecified on atorvastatin 11. Depression on Prozac 12. Type 2 diabetes mellitus.  Last hemoglobin A1c 9.6.  On sliding scale  Code Status:     Code Status Orders  (From admission, onward)         Start     Ordered   05/01/2018 0703  Full code  Continuous     05/15/2018 0706        Code Status History    Date Active Date Inactive Code Status Order ID Comments User Context   05/13/2018 0437 05/08/2018 0706 Full Code [id number]  Loleta Rose, MD ED   04/20/2018 1926 04/27/2018 1843 Full Code [id number]  Judithe Modest, NP Inpatient   04/20/2018 1903 04/20/2018 1926 Full Code [id number]  Houston Siren, MD Inpatient    Advance Directive Documentation     Most Recent Value  Type of Advance Directive  Living will  Pre-existing out of facility DNR order (yellow form or pink MOST form)  -  "MOST" Form in Place?  -     Family Communication: As per critical care specialist Disposition Plan: To be determined  Consultants:  Critical care specialist  Infectious disease  Antibiotics:  Vancomycin  Levaquin  Time spent: 26 minutes  [place]

## 2018-05-19 NOTE — Consult Note (Addendum)
ANTICOAGULATION CONSULT NOTE  Pharmacy Consult for Warfarin Indication: Previous DVT  Allergies  Allergen Reactions  . Skin Protectants, Misc. Rash  . Flagyl [Metronidazole]   . Gabapentin   . Levetiracetam   . Tape Other (See Comments)    blisters  . Ace Inhibitors Rash  . Cefazolin Rash    Rash developed on cefazolin while on long-term treatment (was on 3-4wks)  . Ertapenem Rash  . Sertraline Hcl Rash    Patient Measurements: Height: 5\' 3"  (160 cm) Weight: 158 lb 11.7 oz (72 kg) IBW/kg (Calculated) : 52.4   Vital Signs: Temp: 98.2 F (36.8 C) (04/30 0800) Temp Source: Oral (04/30 0800) BP: 113/67 (04/30 0930) Pulse Rate: 81 (04/30 1000)  Labs: Recent Labs    05-29-2018 2317  05/17/18 0642 05/17/18 1116 05/18/18 0429 05/19/18 0412  HGB  --    < > 9.0*  --  8.0* 7.9*  HCT  --   --  30.6*  --  27.4* 27.0*  PLT  --   --  524*  --  403* 361  LABPROT  --   --  27.7*  --  37.3* 25.7*  INR  --   --  2.6*  --  3.9* 2.4*  CREATININE  --   --  1.09*  --  0.73 0.77  TROPONINI 0.86*  --  0.94* 0.76*  --   --    < > = values in this interval not displayed.    Estimated Creatinine Clearance: 73.7 mL/min (by C-G formula based on SCr of 0.77 mg/dL).   Medical History: Past Medical History:  Diagnosis Date  . Asthma   . CHF (congestive heart failure) (HCC)   . Chronic pain   . COPD (chronic obstructive pulmonary disease) (HCC)   . Diabetes mellitus (HCC)   . Peripheral vascular disease (HCC)   . 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 Patient takes amiodarone 200 mg PO BID PTA (continued inpatient).  Assessment: 58 y.o. female admitted on May 29, 2018 with COPD exacerbation. Patient was recently discharged with pneumonia and received cefazolin via PICC at home. Patient antibiotic regimen changed to levofloxacin and vancomycin.   Goal of Therapy:  INR 2-3 Monitor platelets by  anticoagulation protocol: Yes   Plan:  4/27 INR 1.9 Ordered Warfarin 8.5 mg 4/28 INR 2.6 Held 4/29 INR 3.9, hold warfarin  4/30 INR 2.4 Warfarin 3 mg  Hgb 7.9 - no S/S of bleeding per nurse. Will order warfarin 3 mg for 1800. Will obtain INR with am labs.   Pharmacy will continue to monitor   Mauri Reading, PharmD Pharmacy Resident  05/19/2018 1:53 PM

## 2018-05-19 NOTE — Progress Notes (Signed)
CRITICAL CARE NOTE      SUBJECTIVE FINDINGS & SIGNIFICANT EVENTS     Patient remains critically ill, Alternates b/w BIPAP and HHFNC- currently on 10L HFNCL Prognosis is guarded, patient continues to smoke at home while using narcotics and benzodiazepines Currently asking for xanax Multiple ulcers including sacral, foot, ischial and drug induced maculopapular rash- discussed care plan with ID-Dr Sampson GoonFitzgerald and Hospitalist -Dr Hilton SinclairWeiting  PAST MEDICAL HISTORY   Past Medical History:  Diagnosis Date  . Asthma   . CHF (congestive heart failure) (HCC)   . Chronic pain   . COPD (chronic obstructive pulmonary disease) (HCC)   . Diabetes mellitus (HCC)   . Peripheral vascular disease (HCC)   . 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 below the knee LLE amputation Left      FAMILY HISTORY   Family History  Problem Relation Age of Onset  . Heart disease Mother   . Heart disease Father      SOCIAL HISTORY   Social History   Tobacco Use  . Smoking status: Current Every Day Smoker    Packs/day: 2.00    Years: 45.00    Pack years: 90.00    Types: Cigarettes  . Smokeless tobacco: Never Used  Substance Use Topics  . Alcohol use: Not Currently  . Drug use: Never     MEDICATIONS   Current Medication:  Current Facility-Administered Medications:  .  albuterol (PROVENTIL) (2.5 MG/3ML) 0.083% nebulizer solution 2.5 mg, 2.5 mg, Nebulization, Q4H PRN, Seals, Milas KocherAngela H, NP .  ALPRAZolam Prudy Feeler(XANAX) tablet 0.5 mg, 0.5 mg, Oral, BID PRN, Harlon DittyKeene, Jeremiah D, NP, 0.5 mg at 05/19/18 0809 .  amiodarone (PACERONE) tablet 200 mg, 200 mg, Oral, BID, Seals, Angela H, NP, 200 mg at 05/19/18 [id number] .  aspirin EC tablet 81 mg, 81 mg, Oral, Daily, Seals, [place]Angela H,  NP, 81 mg at 05/19/18 [id number] .  atorvastatin (LIPITOR) tablet 80 mg, 80 mg, Oral, QHS, Seals, Angela H, NP, 80 mg at 05/18/18 2112 .  chlorhexidine (PERIDEX) 0.12 % solution 15 mL, 15 mL, Mouth Rinse, BID, Karna ChristmasAleskerov, Keisha Amer, MD, 15 mL at 05/19/18 0924 .  clopidogrel (PLAVIX) tablet 75 mg, 75 mg, Oral, Daily, Seals, Angela H, NP, 75 mg at 05/19/18 [id number] .  famotidine (PEPCID) tablet 40 mg, 40 mg, Oral, QPM, Seals, Angela H, NP, 40 mg at 05/18/18 1831 .  FLUoxetine (PROZAC) capsule 20 mg, 20 mg, Oral, Daily, Seals, Angela H, NP, 20 mg at 05/19/18 0924 .  fluticasone (FLONASE) 50 MCG/ACT nasal spray 1 spray, 1 spray, Each Nare, Daily, Seals, Angela H, NP, 1 spray at 05/19/18 [phone] .  guaiFENesin (MUCINEX) 12 hr tablet 600 mg, 600 mg, Oral, BID, Mansy, Jan A, MD, 600 mg at 05/19/18 [id number] .  insulin aspart (novoLOG) injection 0-15 Units, 0-15 Units, Subcutaneous, TID WC, Vida RiggerAleskerov, Taegen Delker, MD, 3 Units at 05/19/18 0806 .  insulin aspart (novoLOG) injection 0-5 Units, 0-5 Units, Subcutaneous, QHS, Vida RiggerAleskerov, Laurian Edrington, MD, 3 Units at 05/18/18 2124 .  ipratropium-albuterol (DUONEB) 0.5-2.5 (3) MG/3ML nebulizer solution 3 mL, 3 mL, Nebulization, Q6H, Seals, Angela H, NP, 3 mL at 05/19/18 0812 .  levofloxacin (LEVAQUIN) tablet 750 mg, 750 mg, Oral, q1800, Vida RiggerAleskerov, Meeya Goldin, MD, 750 mg at 05/18/18 1831 .  levothyroxine (SYNTHROID) tablet 88 mcg, 88 mcg, Oral, Daily, Seals, [place]Angela H, NP, 88 mcg at 05/19/18 0551 .  MEDLINE mouth rinse, 15 mL, Mouth Rinse,  q12n4p, Vida Rigger, MD, 15 mL at 05/18/18 1126 .  mirabegron ER (MYRBETRIQ) tablet 25 mg, 25 mg, Oral, Daily, Seals, Angela H, NP, 25 mg at 05/19/18 0925 .  mometasone-formoterol (DULERA) 200-5 MCG/ACT inhaler 2 puff, 2 puff, Inhalation, BID, Seals, Milas Kocher, NP, 2 puff at 05/19/18 0926 .  montelukast (SINGULAIR) tablet 10 mg, 10 mg, Oral, Daily, Seals, Angela H, NP, 10 mg at 05/19/18 1610 .  morphine 2 MG/ML injection 1-2 mg, 1-2 mg, Intravenous, Q3H PRN, Harlon Ditty  D, NP, 2 mg at 05/19/18 0948 .  oxyCODONE-acetaminophen (PERCOCET/ROXICET) 5-325 MG per tablet 1 tablet, 1 tablet, Oral, Q8H PRN, 1 tablet at 05/19/18 0553 **AND** oxyCODONE (Oxy IR/ROXICODONE) immediate release tablet 5 mg, 5 mg, Oral, Q8H PRN, Seals, Angela H, NP, 5 mg at 05/19/18 0809 .  [START ON 05/21/2018] tiotropium (SPIRIVA) inhalation capsule ([facility] use ONLY) 18 mcg, 1 capsule, Inhalation, Daily, Bertram Savin, RPH .  traZODone (DESYREL) tablet 150 mg, 150 mg, Oral, QHS, Seals, Angela H, NP, 150 mg at 05/18/18 2112 .  vancomycin (VANCOCIN) 1,750 mg in sodium chloride 0.9 % 500 mL IVPB, 1,750 mg, Intravenous, Q36H, Mauri Reading, RPH, Last Rate: 250 mL/hr at 05/18/18 2200 .  Warfarin - Pharmacist Dosing Inpatient, , Does not apply, q1800, Alford Highland, MD    ALLERGIES   Skin protectants, misc.; Flagyl [metronidazole]; Gabapentin; Levetiracetam; Tape; Ace inhibitors; Cefazolin; Ertapenem; and Sertraline hcl    REVIEW OF SYSTEMS    10 point ROS negative except for hot flashes and anxiety  PHYSICAL EXAMINATION   Vitals:   05/19/18 0930 05/19/18 1000  BP: 113/67   Pulse: 88 81  Resp: 11 11  Temp:    SpO2: (!) 83% 92%    GENERAL:mild distress due to resp distress HEAD: Normocephalic, atraumatic.  EYES: Pupils equal, round, reactive to light.  No scleral icterus.  MOUTH: Moist mucosal membrane. NECK: Supple. No thyromegaly. No nodules. No JVD.  PULMONARY: coarse rhonchi bilaterally  CARDIOVASCULAR: S1 and S2. Regular rate and rhythm. No murmurs, rubs, or gallops.  GASTROINTESTINAL: Soft, nontender, non-distended. No masses. Positive bowel sounds. No hepatosplenomegaly.  MUSCULOSKELETAL: No swelling, clubbing, or edema.  NEUROLOGIC: Mild distress due to acute illness SKIN:intact,warm,dry   LABS AND IMAGING      LAB RESULTS: Recent Labs  Lab 05/17/18 0642 05/18/18 0429 05/19/18 0412  NA 137 141 137  K 4.5 4.0 4.8  CL 102 107 104  CO2 BUN  35* 28* 26*  CREATININE 1.09* 0.73 0.77  GLUCOSE 151* 56* 185*   Recent Labs  Lab 05/17/18 0642 05/18/18 0429 05/19/18 0412  HGB 9.0* 8.0* 7.9*  HCT 30.6* 27.4* 27.0*  WBC 8.7 5.3 4.8  PLT 524* 403* 361     IMAGING RESULTS: Dg Chest Port 1 View  Result Date: 05/19/2018 CLINICAL DATA:  58 year old female with respiratory failure. Recently negative for COVID-19. EXAM: PORTABLE CHEST 1 VIEW COMPARISON:  05/17/2018 and earlier. FINDINGS: Portable AP semi upright view at 0233 hours. Stable left PICC line. Increased veiling opacity at both lung bases and dense retrocardiac opacity. Coarse superimposed pulmonary interstitial opacity in both lungs with progression from earlier this month but not significantly changed from May 21, 2018. No pneumothorax. Stable cardiac size and mediastinal contours. Negative visible bowel gas pattern. IMPRESSION: 1. Increasing bilateral pleural effusions suspected with lower lobe collapse or consolidation. 2. Diffuse coarse pulmonary interstitial opacity might reflect edema and is stable since 05/21/2018. Electronically Signed   By: Odessa Fleming  M.D.   On: 05/19/2018 02:53      ASSESSMENT AND PLAN     -Multidisciplinary rounds held today    Acuteon chronic hypoxic Respiratory Failure -Multifactorial in etiology 1. Acute decompensated diastolic CHF EF >65% Increasing diuresis today  2.Bibasilar atelectasis -aggressive bronchopulmonary hygiene, MetaNeb therapy twice daily, chest physiotherapy-discussed with respiratory therapist 3.Possible community-acquired pneumonia-urine Legionella antigen sent, strep pneumo antigennegative, respiratory specimennegative thus far, blood culturesngtd. CXR with new infiltrate compared to PA film from April 22, 2018.Currently on empiric CAP regimen 4.COPD-without grossly apparent exacerbation -We will continue typical COPD care path with nebulizer therapy as well as flutter valve and  bronchopulmonary hygiene, no need for systemic glucocorticoids at this time.    Maculopapular rash -likely due to recent prolonged IV cefazolin vs plavix, latter being less likely due to chronicity and tempo - ID consultation - appreciate input    CARDIAC FAILURE- -As above acute decompensated diastolic CHF -BNP elevated>850 -oxygen as needed -Lasix dose increased today 05/19/2018 -follow up cardiac enzymes as indicated ICU monitoring   Moderate protein calorie malnutrition -Albumin 2.4 Dietary consultation   ID- hx of bacteremia and osteomyelitis - microbiology pending  -continue IV abx as prescibed -follow up cultures   GI/Nutrition GI PROPHYLAXIS as indicated DIET-->TF's as tolerated Constipation protocol as indicated   ENDO - ICU hypoglycemic\Hyperglycemia protocol -check FSBS per protocol   ELECTROLYTES -follow labs as needed -replace as needed -pharmacy consultation   DVT/GI PRX ordered -SCDs  TRANSFUSIONS AS NEEDED MONITOR FSBS ASSESS the need for LABS as needed   Critical care provider statement:  Critical care time (minutes):33 Critical care time was exclusive of: Separately billable procedures and treating other patients Critical care was necessary to treat or prevent imminent or life-threatening deterioration of the following conditions:Acute hypoxemic respiratory failure, severe COPD, hyperglycemia, moderate protein calorie malnutrition, multiple comorbid conditions Critical care was time spent personally by me on the following activities: Development of treatment plan with patient or surrogate, discussions with consultants, evaluation of patient's response to treatment, examination of patient, obtaining history from patient or surrogate, ordering and performing treatments and interventions, ordering and review of laboratory studies and re-evaluation   This document was prepared using  Dragon voice recognition software and may include unintentional dictation errors.    Vida Rigger, M.D.  Division of Pulmonary & Critical Care Medicine  [facility]

## 2018-05-19 NOTE — Consult Note (Signed)
Pharmacy Antibiotic Note  Alexandra Goodman is a 58 y.o. female admitted on 05/07/2018 with COPD exacerbation. Patient was recently discharged with MSSA bacteremia and received cefazolin via PICC at home. Patient now has rash thought to be attributed to cefazolin. ID is consulted.  Pharmacy has been consulted for Vancomycin dosing.  Plan: Scr appears to be back to baseline of ~0.7  Increase Vancomycin to 1500 mg IV q24h from 1750 mg IV q36h starting tomorrow as patient received 1750 mg last night at 2100.  Vancomycin Kinetics Using Scr 0.8, IBW, and Vd 0.72 Goal AUC 400-550 Anticipated AUC 501.7  Continue Levofloxacin 750 mg IV q24h for PNA.  Height: 5\' 3"  (160 cm) Weight: 158 lb 11.7 oz (72 kg) IBW/kg (Calculated) : 52.4  Temp (24hrs), Avg:97.9 F (36.6 C), Min:97.7 F (36.5 C), Max:98.2 F (36.8 C)  Recent Labs  Lab 05/15/2018 0428 05/09/2018 0626 05/17/18 0642 05/18/18 0429 05/19/18 0412  WBC 14.7*  --  8.7 5.3 4.8  CREATININE 1.02*  --  1.09* 0.73 0.77  LATICACIDVEN 2.3* 2.1*  --   --   --     Estimated Creatinine Clearance: 73.7 mL/min (by C-G formula based on SCr of 0.77 mg/dL).    Allergies  Allergen Reactions  . Skin Protectants, Misc. Rash  . Flagyl [Metronidazole]   . Gabapentin   . Levetiracetam   . Tape Other (See Comments)    blisters  . Ace Inhibitors Rash  . Cefazolin Rash    Rash developed on cefazolin while on long-term treatment (was on 3-4wks)  . Ertapenem Rash  . Sertraline Hcl Rash    Antimicrobials this admission: 4/27 Azithromycin x 1 4/27 Cefepime x 1 4/27 Vancomycin >> 4/27 Levofloxacin >>  Dose adjustments this admission: 4/28 Decreased Vancomycin from 1250 mg q24 to 1750 mg q36 (starting 4/29) 4/30 Increase Vancomycin to 1500 mg IV q24h from 1750 mg IV q36h starting 5/1  Microbiology results: 4/27 BCx: NGTD  4/27 Sputum: no WBC 4/27 MRSA PCR: (-) 4/27 UCx NG 4/27 COVID (-)  Thank you for allowing pharmacy to be a part of this  patient's care.  Mauri Reading, PharmD Pharmacy Resident  05/19/2018 1:57 PM

## 2018-05-20 ENCOUNTER — Inpatient Hospital Stay: Payer: Medicare Other

## 2018-05-20 LAB — BASIC METABOLIC PANEL
Anion gap: 6 (ref 5–15)
BUN: 23 mg/dL — ABNORMAL HIGH (ref 6–20)
CO2: 27 mmol/L (ref 22–32)
Calcium: 8.3 mg/dL — ABNORMAL LOW (ref 8.9–10.3)
Chloride: 105 mmol/L (ref 98–111)
Creatinine, Ser: 0.95 mg/dL (ref 0.44–1.00)
GFR calc Af Amer: >60 mL/min (ref >60)
GFR calc non Af Amer: >60 mL/min (ref >60)
Glucose, Bld: 160 mg/dL — ABNORMAL HIGH (ref 70–99)
Potassium: 5.7 mmol/L — ABNORMAL HIGH (ref 3.5–5.1)
Sodium: 138 mmol/L (ref 135–145)

## 2018-05-20 LAB — CBC
HCT: 30.9 % — ABNORMAL LOW (ref 36.0–46.0)
Hemoglobin: 9.1 g/dL — ABNORMAL LOW (ref 12.0–15.0)
MCH: 26.5 pg (ref 26.0–34.0)
MCHC: 29.4 g/dL — ABNORMAL LOW (ref 30.0–36.0)
MCV: 89.8 fL (ref 80.0–100.0)
Platelets: 374 10*3/uL (ref 150–400)
RBC: 3.44 MIL/uL — ABNORMAL LOW (ref 3.87–5.11)
RDW: 21.8 % — ABNORMAL HIGH (ref 11.5–15.5)
WBC: 4.8 10*3/uL (ref 4.0–10.5)
nRBC: 0 % (ref 0.0–0.2)

## 2018-05-20 LAB — URINALYSIS, COMPLETE (UACMP) WITH MICROSCOPIC
Bacteria, UA: NONE SEEN
Bilirubin Urine: NEGATIVE
Glucose, UA: 50 mg/dL — AB
Ketones, ur: NEGATIVE mg/dL
Leukocytes,Ua: NEGATIVE
Nitrite: NEGATIVE
Protein, ur: 100 mg/dL — AB
RBC / HPF: >50 RBC/hpf — ABNORMAL HIGH (ref 0–5)
Specific Gravity, Urine: 1.016 (ref 1.005–1.030)
Squamous Epithelial / HPF: NONE SEEN (ref 0–5)
pH: 7 (ref 5.0–8.0)

## 2018-05-20 LAB — GLUCOSE, CAPILLARY
Glucose-Capillary: 203 mg/dL — ABNORMAL HIGH (ref 70–99)
Glucose-Capillary: 261 mg/dL — ABNORMAL HIGH (ref 70–99)
Glucose-Capillary: 274 mg/dL — ABNORMAL HIGH (ref 70–99)
Glucose-Capillary: 202 mg/dL — ABNORMAL HIGH (ref 70–99)
Glucose-Capillary: 268 mg/dL — ABNORMAL HIGH (ref 70–99)

## 2018-05-20 LAB — POTASSIUM: Potassium: 4.5 mmol/L (ref 3.5–5.1)

## 2018-05-20 LAB — PROTIME-INR
INR: 1.4 — ABNORMAL HIGH (ref 0.8–1.2)
Prothrombin Time: 16.7 seconds — ABNORMAL HIGH (ref 11.4–15.2)

## 2018-05-20 LAB — BRAIN NATRIURETIC PEPTIDE: B Natriuretic Peptide: 1225 pg/mL — ABNORMAL HIGH (ref 0.0–100.0)

## 2018-05-20 MED ORDER — VANCOMYCIN HCL 10 G IV SOLR
1250.0000 mg | INTRAVENOUS | Status: DC
  Administered 2018-05-21: 08:00:00 1250 mg via INTRAVENOUS
  Filled 2018-05-20 (×2): qty 1250

## 2018-05-20 MED ORDER — FUROSEMIDE 10 MG/ML IJ SOLN
80.0000 mg | Freq: Once | INTRAMUSCULAR | Status: AC
  Administered 2018-05-20: 80 mg via INTRAVENOUS
  Filled 2018-05-20: qty 8

## 2018-05-20 MED ORDER — ENSURE MAX PROTEIN PO LIQD
11.0000 [oz_av] | Freq: Two times a day (BID) | ORAL | Status: DC
  Administered 2018-05-20 – 2018-05-23 (×5): 11 [oz_av] via ORAL
  Filled 2018-05-20: qty 330

## 2018-05-20 MED ORDER — NYSTATIN 100000 UNIT/ML MT SUSP
5.0000 mL | Freq: Four times a day (QID) | OROMUCOSAL | Status: DC
  Administered 2018-05-20 – 2018-05-25 (×12): 500000 [IU] via ORAL
  Filled 2018-05-20 (×31): qty 5

## 2018-05-20 MED ORDER — WARFARIN SODIUM 7.5 MG PO TABS
7.5000 mg | ORAL_TABLET | Freq: Once | ORAL | Status: AC
  Administered 2018-05-20: 7.5 mg via ORAL
  Filled 2018-05-20: qty 1

## 2018-05-20 MED ORDER — SODIUM CHLORIDE 0.9% FLUSH: 10.0000 mL | INTRAVENOUS | Status: DC | PRN

## 2018-05-20 MED ORDER — SODIUM CHLORIDE 0.9% FLUSH
10.0000 mL | Freq: Two times a day (BID) | INTRAVENOUS | Status: DC
  Administered 2018-05-20 – 2018-05-21 (×2): 10 mL
  Administered 2018-05-21: 20 mL
  Administered 2018-05-22 – 2018-05-27 (×10): 10 mL

## 2018-05-20 MED ORDER — SODIUM POLYSTYRENE SULFONATE 15 GM/60ML PO SUSP
30.0000 g | Freq: Once | ORAL | Status: AC
  Administered 2018-05-20: 06:00:00 30 g via ORAL
  Filled 2018-05-20: qty 120

## 2018-05-20 MED ORDER — VITAMIN C 500 MG PO TABS
250.0000 mg | ORAL_TABLET | Freq: Two times a day (BID) | ORAL | Status: DC
  Administered 2018-05-20 – 2018-05-26 (×9): 250 mg via ORAL
  Filled 2018-05-20 (×9): qty 1

## 2018-05-20 MED ORDER — ADULT MULTIVITAMIN W/MINERALS CH
1.0000 | ORAL_TABLET | Freq: Every day | ORAL | Status: DC
  Administered 2018-05-21 – 2018-05-26 (×4): 1 via ORAL
  Filled 2018-05-20 (×4): qty 1

## 2018-05-20 MED ORDER — FUROSEMIDE 10 MG/ML IJ SOLN
80.0000 mg | Freq: Once | INTRAMUSCULAR | Status: AC
  Administered 2018-05-20: 19:00:00 80 mg via INTRAVENOUS
  Filled 2018-05-20: qty 8

## 2018-05-20 NOTE — Progress Notes (Signed)
Patient ID: Alexandra Goodman, female   DOB: [birth year], 58 y.o.   MRN: [id number]   [employer] [facility] PROGRESS NOTE  Alexandra Goodman MGQ:[id number] DOB: [birth year] DOA: 05/06/2018 PCP: Erline Levine, MD  HPI/Subjective: Patient feels like she is full of fluid.  She is swollen everywhere in her legs arms and abdomen.  She states that she takes 160 mg of Lasix twice a day.  Patient still on high flow nasal cannula 10 L  Objective: Vitals:   05/20/18 1200 05/20/18 1300  BP: (!) 104/59 (!) 105/53  Pulse: 88 92  Resp: 16 16  Temp:    SpO2: 94% 100%    Filed Weights   05/06/2018 0424 05/15/2018 0900  Weight: 66 kg 72 kg    ROS: Review of Systems  Constitutional: Negative for chills and fever.  Eyes: Negative for blurred vision.  Respiratory: Positive for cough, shortness of breath and wheezing.   Cardiovascular: Negative for chest pain.  Gastrointestinal: Negative for abdominal pain, constipation, diarrhea, nausea and vomiting.  Genitourinary: Negative for dysuria.  Musculoskeletal: Positive for back pain. Negative for joint pain.  Skin: Positive for rash.  Neurological: Negative for dizziness and headaches.  Psychiatric/Behavioral: The patient is nervous/anxious.    Exam: Physical Exam  Constitutional: She is oriented to person, place, and time.  HENT:  Nose: No mucosal edema.  Mouth/Throat: No oropharyngeal exudate or posterior oropharyngeal edema.  Thrush on the tongue  Eyes: Pupils are equal, round, and reactive to light. Conjunctivae, EOM and lids are normal.  Neck: No JVD present. Carotid bruit is not present. No edema present. No thyroid mass and no thyromegaly present.  Cardiovascular: S1 normal and S2 normal. Exam reveals no gallop.  No murmur heard. Pulses:      Dorsalis pedis pulses are 2+ on the right side and 2+ on the left side.  Respiratory: No accessory muscle usage. No respiratory distress. She has decreased breath sounds in the right lower field and the left  lower field. She has no wheezes. She has rhonchi in the right lower field and the left lower field. She has no rales.  GI: Soft. Bowel sounds are normal. There is no abdominal tenderness.  Musculoskeletal:     Right wrist: She exhibits swelling.     Left wrist: She exhibits swelling.     Left knee: She exhibits swelling.     Right ankle: She exhibits swelling.  Lymphadenopathy:    She has no cervical adenopathy.  Neurological: She is alert and oriented to person, place, and time. No cranial nerve deficit.  Skin: Skin is warm. Nails show no clubbing.  Dark palpable purpura rash on arms.  Also having a little bit on the legs.  Psychiatric: She has a normal mood and affect.      Data Reviewed: Basic Metabolic Panel: Recent Labs  Lab 05/04/2018 0428 05/17/18 2671 05/18/18 0429 05/19/18 0412 05/20/18 0353 05/20/18 0956  NA 136 137 141 137 138  --   K 4.5 4.5 4.0 4.8 5.7* 4.5  CL 97* 102 107 104 105  --   CO2 26 28 27 29 27   --   GLUCOSE 190* 151* 56* 185* 160*  --   BUN 31* 35* 28* 26* 23*  --   CREATININE 1.02* 1.09* 0.73 0.77 0.95  --   CALCIUM 8.6* 8.6* 8.1* 8.0* 8.3*  --   MG 1.6*  --  2.2  --   --   --   PHOS  --   --  3.6  --   --   --    Liver Function Tests: Recent Labs  Lab 2018/05/04 0428  AST 16  ALT <5  ALKPHOS 138*  BILITOT 0.5  PROT 6.5  ALBUMIN 2.4*   CBC: Recent Labs  Lab 2018/05/04 0428 05/17/18 0642 05/18/18 0429 05/19/18 0412 05/20/18 0353  WBC 14.7* 8.7 5.3 4.8 4.8  NEUTROABS 13.1*  --   --   --   --   HGB 9.6* 9.0* 8.0* 7.9* 9.1*  HCT 33.1* 30.6* 27.4* 27.0* 30.9*  MCV 89.7 90.0 89.8 89.1 89.8  PLT 534* 524* 403* 361 374   Cardiac Enzymes: Recent Labs  Lab 2018/05/04 0735 2018/05/04 1753 2018/05/04 2317 05/17/18 0642 05/17/18 1116  TROPONINI 0.39* 0.66* 0.86* 0.94* 0.76*   BNP (last 3 results) Recent Labs    05/18/18 0429 05/19/18 0412 05/20/18 0353  BNP 1,105.0* 1,472.0* 1,225.0*     CBG: Recent Labs  Lab 05/19/18 1136  05/19/18 1633 05/19/18 2107 05/20/18 0739 05/20/18 1136  GLUCAP 202* 208* 182* 203* 261*    Recent Results (from the past 240 hour(s))  SARS Coronavirus 2 [facility](Hospital order, Performed in [facility] lab)     Status: None   Collection Time: 2018/05/04  4:28 AM  Result Value Ref Range Status   SARS Coronavirus 2 NEGATIVE NEGATIVE Final    Comment: (NOTE) If result is NEGATIVE SARS-CoV-2 target nucleic acids are NOT DETECTED. The SARS-CoV-2 RNA is generally detectable in upper and lower  respiratory specimens during the acute phase of infection. The lowest  concentration of SARS-CoV-2 viral copies this assay can detect is 250  copies / mL. A negative result does not preclude SARS-CoV-2 infection  and should not be used as the sole basis for treatment or other  patient management decisions.  A negative result may occur with  improper specimen collection / handling, submission of specimen other  than nasopharyngeal swab, presence of viral mutation(s) within the  areas targeted by this assay, and inadequate number of viral copies  (<250 copies / mL). A negative result must be combined with clinical  observations, patient history, and epidemiological information. If result is POSITIVE SARS-CoV-2 target nucleic acids are DETECTED. The SARS-CoV-2 RNA is generally detectable in upper and lower  respiratory specimens dur ing the acute phase of infection.  Positive  results are indicative of active infection with SARS-CoV-2.  Clinical  correlation with patient history and other diagnostic information is  necessary to determine patient infection status.  Positive results do  not rule out bacterial infection or co-infection with other viruses. If result is PRESUMPTIVE POSTIVE SARS-CoV-2 nucleic acids MAY BE PRESENT.   A presumptive positive result was obtained on the submitted specimen  and confirmed on repeat testing.  While 2019 novel coronavirus  (SARS-CoV-2) nucleic acids may be  present in the submitted sample  additional confirmatory testing may be necessary for epidemiological  and / or clinical management purposes  to differentiate between  SARS-CoV-2 and other Sarbecovirus currently known to infect humans.  If clinically indicated additional testing with an alternate test  methodology [phone]([id number]) is advised. The SARS-CoV-2 RNA is generally  detectable in upper and lower respiratory sp ecimens during the acute  phase of infection. The expected result is Negative. Fact Sheet for Patients:  BoilerBrush.com.cyhttps://www.fda.gov/media/136312/download Fact Sheet for Healthcare Providers: https://pope.com/https://www.fda.gov/media/136313/download This test is not yet approved or cleared by the [place] FDA and has been authorized for detection and/or diagnosis of SARS-CoV-2 by FDA under an Emergency  Use Authorization (EUA).  This EUA will remain in effect (meaning this test can be used) for the duration of the COVID-19 declaration under Section 564(b)(1) of the Act, 21 U.S.C. section 360bbb-3(b)(1), unless the authorization is terminated or revoked sooner. Performed at [facility], [address], [place], [place] [address]   Blood Culture (routine x 2)     Status: None (Preliminary result)   Collection Time: 05/22/18  4:51 AM  Result Value Ref Range Status   Specimen Description BLOOD RIGHT FOREARM  Final   Special Requests   Final    BOTTLES DRAWN AEROBIC AND ANAEROBIC Blood Culture results may not be optimal due to an excessive volume of blood received in culture bottles   Culture   Final    NO GROWTH 4 DAYS Performed at [facility], [address] [place], [place] [address]    Report Status PENDING  Incomplete  Blood Culture (routine x 2)     Status: None (Preliminary result)   Collection Time: 22-May-2018  4:51 AM  Result Value Ref Range Status   Specimen Description BLOOD RIGHT HAND  Final   Special Requests   Final    BOTTLES DRAWN AEROBIC AND ANAEROBIC Blood  Culture results may not be optimal due to an inadequate volume of blood received in culture bottles   Culture   Final    NO GROWTH 4 DAYS Performed at [facility], [address] [place], [place] [address]    Report Status PENDING  Incomplete  MRSA PCR Screening     Status: None   Collection Time: May 22, 2018  5:53 PM  Result Value Ref Range Status   MRSA by PCR NEGATIVE NEGATIVE Final    Comment:        The GeneXpert MRSA Assay (FDA approved for NASAL specimens only), is one component of a comprehensive MRSA colonization surveillance program. It is not intended to diagnose MRSA infection nor to guide or monitor treatment for MRSA infections. Performed at [facility], [address] [place], [place] [address]   Culture, sputum-assessment     Status: None   Collection Time: 05/22/18  8:00 PM  Result Value Ref Range Status   Specimen Description SPUTUM  Final   Special Requests NONE  Final   Sputum evaluation   Final    THIS SPECIMEN IS ACCEPTABLE FOR SPUTUM CULTURE Performed at [facility], [address], [place], [place] [address]    Report Status 2018-05-22 FINAL  Final  Culture, respiratory     Status: None   Collection Time: 05-22-2018  8:00 PM  Result Value Ref Range Status   Specimen Description   Final    SPUTUM Performed at [facility], [address], [place], [place] [address]    Special Requests   Final    NONE Reflexed from [id number] Performed at [facility], [address] [place], [place] [address]    Gram Stain   Final    FEW SQUAMOUS EPITHELIAL CELLS PRESENT NO WBC SEEN FEW YEAST Performed at [facility], [place] [address] [place], [place] [address]    Culture   Final    ABUNDANT CANDIDA ALBICANS FEW CANDIDA TROPICALIS    Report Status 05/19/2018 FINAL  Final  Urine Culture     Status: None   Collection Time: May 22, 2018  9:10 PM  Result Value Ref Range Status   Specimen Description   Final     URINE, CLEAN CATCH Performed at  [facility], [address], [place], [place] [address]    Special Requests   Final    Normal Performed at [facility], [address] [place], [place] [address]    Culture   Final    NO GROWTH Performed at [facility], [place] [address] [place], [place] [address]    Report Status 05/18/2018 FINAL  Final     Studies: Dg Chest Port 1 View  Result Date: 05/20/2018 CLINICAL DATA:  Acute respiratory failure EXAM: PORTABLE CHEST 1 VIEW COMPARISON:  May 19, 2018 FINDINGS: The heart size and mediastinal contours are stable. Left PICC line is identified unchanged compared prior exam. Increased pulmonary interstitium is identified bilaterally slightly improved. Patchy consolidation is identified in bilateral lung bases unchanged. The visualized skeletal structures are stable. IMPRESSION: Increased pulmonary interstitium in both lungs are slightly improved compared to prior exam. Patchy consolidation of bilateral lung bases are unchanged compared prior exam. Bilateral pleural effusions are probably unchanged. Electronically Signed   By: Abelardo Diesel M.D.   On: 05/20/2018 12:04   Dg Chest Port 1 View  Result Date: 05/19/2018 CLINICAL DATA:  58 year old female with respiratory failure. Recently negative for COVID-19. EXAM: PORTABLE CHEST 1 VIEW COMPARISON:  05/17/2018 and earlier. FINDINGS: Portable AP semi upright view at 0233 hours. Stable left PICC line. Increased veiling opacity at both lung bases and dense retrocardiac opacity. Coarse superimposed pulmonary interstitial opacity in both lungs with progression from earlier this month but not significantly changed from 25-May-2018. No pneumothorax. Stable cardiac size and mediastinal contours. Negative visible bowel gas pattern. IMPRESSION: 1. Increasing bilateral pleural effusions suspected with lower lobe collapse or consolidation. 2. Diffuse coarse pulmonary interstitial opacity might  reflect edema and is stable since 05-25-18. Electronically Signed   By: Genevie Ann M.D.   On: 05/19/2018 02:53    Scheduled Meds: . amiodarone  200 mg Oral BID  . aspirin EC  81 mg Oral Daily  . atorvastatin  80 mg Oral QHS  . chlorhexidine  15 mL Mouth Rinse BID  . clopidogrel  75 mg Oral Daily  . famotidine  40 mg Oral QPM  . FLUoxetine  20 mg Oral Daily  . fluticasone  1 spray Each Nare Daily  . guaiFENesin  600 mg Oral BID  . insulin aspart  0-15 Units Subcutaneous TID WC  . insulin aspart  0-5 Units Subcutaneous QHS  . ipratropium-albuterol  3 mL Nebulization Q6H  . levofloxacin  750 mg Oral q1800  . levothyroxine  88 mcg Oral Daily  . mouth rinse  15 mL Mouth Rinse q12n4p  . mirabegron ER  25 mg Oral Daily  . mometasone-formoterol  2 puff Inhalation BID  . montelukast  10 mg Oral Daily  . [START ON 05/21/2018] multivitamin with minerals  1 tablet Oral Daily  . nystatin  5 mL Oral QID  . Ensure Max Protein  11 oz Oral BID  . sodium chloride flush  10-40 mL Intracatheter Q12H  . [START ON 05/21/2018] tiotropium  1 capsule Inhalation Daily  . traZODone  150 mg Oral QHS  . vitamin C  250 mg Oral BID  . warfarin  7.5 mg Oral ONCE-1800  . Warfarin - Pharmacist Dosing Inpatient   Does not apply q1800   Continuous Infusions: . [START ON 05/21/2018] vancomycin      Assessment/Plan:  1. Acute on chronic hypoxic respiratory failure.  The patient still requiring high flow nasal cannula 10 L.  On BiPAP  at night.  Respiratory status still tenuous. 2. Acute on chronic diastolic congestive heart failure.  Patient given Lasix 80 mg this morning.  May be able to give this dose twice a day. 3. Healthcare associated pneumonia on vancomycin and Levaquin.  4. COPD exacerbation.  On nebulizer therapy.  5. MSSA bacteremia and discitis.  Antibiotics switched over to vancomycin as per infectious disease 6. Rash.  Possible erythema multiforme.  Beta lactams discontinued.  Observe rash.  7. Elevated  troponin likely demand ischemia from acute hypoxic respiratory failure and CHF 8. History of stroke and peripheral vascular disease therapeutic on Coumadin 9. Hypothyroidism unspecified on levothyroxine 10. Hyperlipidemia unspecified on atorvastatin 11. Depression on Prozac 12. Type 2 diabetes mellitus.  Last hemoglobin A1c 9.6.  On sliding scale 13. Thrush on nystatin swish and swallow  Code Status:     Code Status Orders  (From admission, onward)         Start     Ordered   04/25/2018 0703  Full code  Continuous     05/09/2018 0706        Code Status History    Date Active Date Inactive Code Status Order ID Comments User Context   05/13/2018 0437 05/03/2018 0706 Full Code [id number]  Loleta Rose, MD ED   04/20/2018 1926 04/27/2018 1843 Full Code [id number]  Judithe Modest, NP Inpatient   04/20/2018 1903 04/20/2018 1926 Full Code [id number]  Houston Siren, MD Inpatient    Advance Directive Documentation     Most Recent Value  Type of Advance Directive  Living will  Pre-existing out of facility DNR order (yellow form or pink MOST form)  -  "MOST" Form in Place?  -     Family Communication: As per critical care specialist Disposition Plan: To be determined  Consultants:  Critical care specialist  Infectious disease  Antibiotics:  Vancomycin  Levaquin  Time spent: 27 minutes, case discussed with critical care specialist  Alford Highland  [facility]

## 2018-05-20 NOTE — Progress Notes (Signed)
Inpatient Diabetes Program Recommendations  AACE/ADA: New Consensus Statement on Inpatient Glycemic Control  Target Ranges:  Prepandial:   less than 140 mg/dL      Peak postprandial:   less than 180 mg/dL (1-2 hours)      Critically ill patients:  140 - 180 mg/dL  Results for JERICAH, STEINHILBER (MRN [id number]) as of 05/20/2018 08:56  Ref. Range 05/19/2018 07:43 05/19/2018 11:36 05/19/2018 16:33 05/19/2018 21:07 05/20/2018 07:39  Glucose-Capillary Latest Ref Range: 70 - 99 mg/dL 375 (H) 436 (H) 067 (H) 182 (H) 203 (H)   Results for JASREET, ACE (MRN [id number]) as of 05/19/2018 08:17  Ref. Range 05/18/2018 07:40 05/18/2018 11:48 05/18/2018 16:06 05/18/2018 21:22  Glucose-Capillary Latest Ref Range: 70 - 99 mg/dL 818 (H) 590 (H) 931 (H) 265 (H)   Review of Glycemic Control  Diabetes history: DM2 Outpatient Diabetes medications: Lantus 46 units QHS, Metformin 1000 mg BID Current orders for Inpatient glycemic control: Novolog 0-15 units TID with meals, Novolog 0-5 units QHS  Inpatient Diabetes Program Recommendations:   Insulin-Meal Coverage: Please consider ordering Novolog 3 units TID with meals for meal coverage if patient is eating at least 50% of meals.  Thanks, Orlando Penner, RN, MSN, CDE Diabetes Coordinator Inpatient Diabetes Program [phone] (Team Pager from 8am to 5pm)

## 2018-05-20 NOTE — Progress Notes (Addendum)
PHARMACY CONSULT NOTE - FOLLOW UP  Pharmacy Consult for Electrolyte Monitoring and Replacement   Recent Labs: Potassium (mmol/L)  Date Value  05/20/2018 4.5  01/15/2013 4.2   Magnesium (mg/dL)  Date Value  60/60/0459 2.2  01/14/2013 1.8   Calcium (mg/dL)  Date Value  97/74/1423 8.3 (L)   Calcium, Total (mg/dL)  Date Value  95/32/0233 8.8   Albumin (g/dL)  Date Value  43/56/8616 2.4 (L)  01/14/2013 2.4 (L)   Phosphorus (mg/dL)  Date Value  83/72/9021 3.6   Sodium (mmol/L)  Date Value  05/20/2018 138  01/15/2013 137     Assessment: Patient admitted with respiratory failure/COPD exacerbation.  Goal of Therapy:  Potassium ~4.0, Magnesium ~2.0  Plan:  Did not order any electrolyte replacement on 4/20; however patient had elevated potassium and received Kayexelate 30g x 1. Patient also received furosemide 80 mg IV x 1. She has not had a BM yet per nurse. Repeat K returned WNL.   Will obtain BMP with am labs.   Pharmacy will continue to monitor and adjust per consult.    Mauri Reading, PharmD Pharmacy Resident  05/20/2018 11:57 AM

## 2018-05-20 NOTE — Consult Note (Signed)
ANTICOAGULATION CONSULT NOTE  Pharmacy Consult for Warfarin Indication: Previous DVT  Allergies  Allergen Reactions  . Skin Protectants, Misc. Rash  . Flagyl [Metronidazole]   . Gabapentin   . Levetiracetam   . Tape Other (See Comments)    blisters  . Ace Inhibitors Rash  . Cefazolin Rash    Rash developed on cefazolin while on long-term treatment (was on 3-4wks)  . Ertapenem Rash  . Sertraline Hcl Rash    Patient Measurements: Height: 5\' 3"  (160 cm) Weight: 158 lb 11.7 oz (72 kg) IBW/kg (Calculated) : 52.4   Vital Signs: Temp: 98.4 F (36.9 C) (05/01 0751) Temp Source: Oral (05/01 0751) BP: 105/56 (05/01 1000) Pulse Rate: 87 (05/01 1000)  Labs: Recent Labs    05/18/18 0429 05/19/18 0412 05/20/18 0353  HGB 8.0* 7.9* 9.1*  HCT 27.4* 27.0* 30.9*  PLT 403* 361 374  LABPROT 37.3* 25.7* 16.7*  INR 3.9* 2.4* 1.4*  CREATININE 0.73 0.77 0.95    Estimated Creatinine Clearance: 62.1 mL/min (by C-G formula based on SCr of 0.95 mg/dL).   Medical History: Past Medical History:  Diagnosis Date  . Asthma   . CHF (congestive heart failure) (HCC)   . Chronic pain   . COPD (chronic obstructive pulmonary disease) (HCC)   . Diabetes mellitus (HCC)   . Peripheral vascular disease (HCC)   . 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 Patient takes amiodarone 200 mg PO BID PTA (continued inpatient).  Assessment: 58 y.o. female admitted on 05/04/2018 with COPD exacerbation. Patient was recently discharged with pneumonia and received cefazolin via PICC at home. Patient antibiotic regimen changed to levofloxacin and vancomycin.   Goal of Therapy:  INR 2-3 Monitor platelets by anticoagulation protocol: Yes   Plan:  4/27 INR 1.9 Ordered Warfarin 8.5 mg 4/28 INR 2.6 Held 4/29 INR 3.9, hold warfarin  4/30 INR 2.4 Warfarin 3 mg 5/1   INR 1.4 Warfarin 7.5 mg  H&H WNL. Patient appears to  be very sensitive to warfarin dose adjustments. Will order home dose of warfarin 7.5 mg PO for 1800. Will obtain INR with am labs.   Pharmacy will continue to monitor   Mauri Reading, PharmD Pharmacy Resident  05/20/2018 11:53 AM

## 2018-05-20 NOTE — Consult Note (Addendum)
Pharmacy Antibiotic Note  Alexandra Goodman is a 58 y.o. female admitted on 14-Jun-2018 with COPD exacerbation. Patient was recently discharged with MSSA bacteremia and received cefazolin via PICC at home. Patient now has rash thought to be attributed to cefazolin. ID is consulted.  Pharmacy has been consulted for Vancomycin dosing.  Plan: Decrease Vancomycin from 1500 mg IV q24h to 1250 mg q24h starting tomorrow as patient received 1500 mg last this AM @~0800.  Vancomycin Kinetics Using Scr 0.95, IBW, and Vd 0.72 Goal AUC 400-550 Anticipated AUC 489.5  Will hold off on ordering levels, but anticipate getting levels on 5/3 pending stable dose. Will continue to monitor.  Continue Levofloxacin 750 mg IV q24h for PNA.  Height: 5\' 3"  (160 cm) Weight: 158 lb 11.7 oz (72 kg) IBW/kg (Calculated) : 52.4  Temp (24hrs), Avg:97.9 F (36.6 C), Min:97.7 F (36.5 C), Max:98.4 F (36.9 C)  Recent Labs  Lab 06-14-2018 0428 06-14-18 0626 05/17/18 0642 05/18/18 0429 05/19/18 0412 05/20/18 0353  WBC 14.7*  --  8.7 5.3 4.8 4.8  CREATININE 1.02*  --  1.09* 0.73 0.77 0.95  LATICACIDVEN 2.3* 2.1*  --   --   --   --     Estimated Creatinine Clearance: 62.1 mL/min (by C-G formula based on SCr of 0.95 mg/dL).    Allergies  Allergen Reactions  . Skin Protectants, Misc. Rash  . Flagyl [Metronidazole]   . Gabapentin   . Levetiracetam   . Tape Other (See Comments)    blisters  . Ace Inhibitors Rash  . Cefazolin Rash    Rash developed on cefazolin while on long-term treatment (was on 3-4wks)  . Ertapenem Rash  . Sertraline Hcl Rash    Antimicrobials this admission: 4/27 Azithromycin x 1 4/27 Cefepime x 1 4/27 Vancomycin >> 4/27 Levofloxacin >> 5/2  Dose adjustments this admission: 4/28 Decreased Vancomycin from 1250 mg q24 to 1750 mg q36 (starting 4/29) 4/30 Increase Vancomycin to 1500 mg IV q24h from 1750 mg IV q36h starting 5/1 5/1 Decrease Vancomycin from 1500 mg IV q24h to 1250 mg  q24h  Microbiology results: 4/27 BCx: NG x 4 days 4/27 Sputum: no WBC 4/27 MRSA PCR: (-) 4/27 UCx NG 4/27 COVID (-)  Thank you for allowing pharmacy to be a part of this patient's care.  Mauri Reading, PharmD Pharmacy Resident  05/20/2018 2:06 PM

## 2018-05-20 NOTE — Consult Note (Signed)
Pharmacy Antibiotic Note  Alexandra Goodman is a 58 y.o. female admitted on 05-24-2018 with COPD exacerbation. Patient was recently discharged with MSSA bacteremia and received cefazolin via PICC at home. Patient now has rash thought to be attributed to cefazolin. ID is consulted.  Pharmacy has been consulted for Vancomycin dosing.  Plan: Decrease Vancomycin from 1500 mg IV q24h to 1250 mg starting tomorrow as patient received 1500 mg last this AM @~0800.  Vancomycin Kinetics Using Scr 0.95, IBW, and Vd 0.72 Goal AUC 400-550 Anticipated AUC 489.5  Continue Levofloxacin 750 mg IV q24h for PNA.  Height: 5\' 3"  (160 cm) Weight: 158 lb 11.7 oz (72 kg) IBW/kg (Calculated) : 52.4  Temp (24hrs), Avg:98.1 F (36.7 C), Min:97.7 F (36.5 C), Max:98.5 F (36.9 C)  Recent Labs  Lab 05-24-2018 0428 05/24/18 0626 05/17/18 0642 05/18/18 0429 05/19/18 0412 05/20/18 0353  WBC 14.7*  --  8.7 5.3 4.8 4.8  CREATININE 1.02*  --  1.09* 0.73 0.77 0.95  LATICACIDVEN 2.3* 2.1*  --   --   --   --     Estimated Creatinine Clearance: 62.1 mL/min (by C-G formula based on SCr of 0.95 mg/dL).    Allergies  Allergen Reactions  . Skin Protectants, Misc. Rash  . Flagyl [Metronidazole]   . Gabapentin   . Levetiracetam   . Tape Other (See Comments)    blisters  . Ace Inhibitors Rash  . Cefazolin Rash    Rash developed on cefazolin while on long-term treatment (was on 3-4wks)  . Ertapenem Rash  . Sertraline Hcl Rash    Antimicrobials this admission: 4/27 Azithromycin x 1 4/27 Cefepime x 1 4/27 Vancomycin >> 4/27 Levofloxacin >> 5/2  Dose adjustments this admission: 4/28 Decreased Vancomycin from 1250 mg q24 to 1750 mg q36 (starting 4/29) 4/30 Increase Vancomycin to 1500 mg IV q24h from 1750 mg IV q36h starting 5/1 5/1 Decrease Vancomycin from 1500 mg IV q24h to 1250 mg   Microbiology results: 4/27 BCx: NG x 4 days 4/27 Sputum: no WBC 4/27 MRSA PCR: (-) 4/27 UCx NG 4/27 COVID (-)  Thank you  for allowing pharmacy to be a part of this patient's care.  Mauri Reading, PharmD Pharmacy Resident  05/20/2018 11:59 AM

## 2018-05-20 NOTE — Progress Notes (Signed)
Initial Nutrition Assessment  DOCUMENTATION CODES:   Not applicable  INTERVENTION:   Ensure Max protein supplement BID, each supplement provides 150kcal and 30g of protein.  MVI daily   Vitamin C 250mg  po BID  Pt likely at moderate refeed risk  NUTRITION DIAGNOSIS:   Increased nutrient needs related to chronic illness(COPD, CHF, wound healing ) as evidenced by increased estimated needs.  GOAL:   Patient will meet greater than or equal to 90% of their needs  MONITOR:   PO intake, Supplement acceptance, Labs, Weight trends, I & O's, Skin  REASON FOR ASSESSMENT:   Malnutrition Screening Tool    ASSESSMENT:   58 y.o. female with MSSA bacteremia, discitis, Acute resp failure/COPD, CHF, DM   RD working remotely.  Suspect pt with poor appetite and oral intake pta r/t SOB. Pt eating <25% of meals during her last admit on 4/7. RD will add supplements and vitamins to help pt meet her estimated needs and support wound healing. Per chart review, pt appears fairly weight stable pta.    Medications reviewed and include: aspirin, plavix, pepcid, prozac, insulin, synthroid, warfarin, vancomycin    Labs reviewed: K 4.5 wnl, BUN 23(H) BNP 1225(H)- 5/1 Hgb 9.1(L), Hct 30.9(L) cbgs- 203, 261 x 24 hrs AIC 9.6(H)- 4/1  Unable to complete Nutrition-Focused physical exam at this time.   Diet Order:   Diet Order            Diet Carb Modified Fluid consistency: Thin; Room service appropriate? Yes  Diet effective now             EDUCATION NEEDS:   No education needs have been identified at this time  Skin:  Skin Assessment: Reviewed RN Assessment(ecchymosis, Stage II buttocks, deep tissure injury hip, Stage II toe and heel, Stage II sacrum )  Last BM:  4/29- type 6  Height:   Ht Readings from Last 1 Encounters:  06/07/18 5\' 3"  (1.6 m)    Weight:   Wt Readings from Last 1 Encounters:  06/07/2018 72 kg    Ideal Body Weight:  52.3 kg  BMI:  Body mass index is 28.12  kg/m.  Estimated Nutritional Needs:   Kcal:  1600-1800kcal/day   Protein:  80-90g/day   Fluid:  >1.6L/day   Betsey Holiday MS, RD, LDN Pager #- [phone] Office#- [phone] After Hours Pager: [phone]

## 2018-05-20 NOTE — Progress Notes (Signed)
Updated pts daughter Nigel Yong via telephone regarding plan of care and all questions answered.  Sonda Rumble, AGNP  Pulmonary/Critical Care Pager [phone] (please enter 7 digits) PCCM Consult Pager [phone] (please enter 7 digits)

## 2018-05-20 NOTE — Progress Notes (Signed)
CRITICAL CARE NOTE       SUBJECTIVE FINDINGS & SIGNIFICANT EVENTS   Patient reports clinical improvement. Patient reports current analgesia regimen is inadequate, request IV morphine.   PAST MEDICAL HISTORY   Past Medical History:  Diagnosis Date  . Asthma   . CHF (congestive heart failure) (HCC)   . Chronic pain   . COPD (chronic obstructive pulmonary disease) (HCC)   . Diabetes mellitus (HCC)   . Peripheral vascular disease (HCC)   . Stroke (cerebrum) [facility])      SURGICAL HISTORY   Past Surgical History:  Procedure Laterality Date  . below the knee LLE amputation Left      FAMILY HISTORY   Family History  Problem Relation Age of Onset  . Heart disease Mother   . Heart disease Father      SOCIAL HISTORY   Social History   Tobacco Use  . Smoking status: Current Every Day Smoker    Packs/day: 2.00    Years: 45.00    Pack years: 90.00    Types: Cigarettes  . Smokeless tobacco: Never Used  Substance Use Topics  . Alcohol use: Not Currently  . Drug use: Never     MEDICATIONS   Current Medication:  Current Facility-Administered Medications:  .  albuterol (PROVENTIL) (2.5 MG/3ML) 0.083% nebulizer solution 2.5 mg, 2.5 mg, Nebulization, Q4H PRN, Seals, Angela H, NP .  ALPRAZolam Prudy Feeler) tablet 0.5 mg, 0.5 mg, Oral, BID PRN, Harlon Ditty D, NP, 0.5 mg at 05/20/18 1001 .  amiodarone (PACERONE) tablet 200 mg, 200 mg, Oral, BID, Seals, Angela H, NP, 200 mg at 05/20/18 0946 .  aspirin EC tablet 81 mg, 81 mg, Oral, Daily, Seals, [place] H, NP, 81 mg at 05/20/18 0946 .  atorvastatin (LIPITOR) tablet 80 mg, 80 mg, Oral, QHS, Seals, Angela H, NP, 80 mg at 05/19/18 2109 .  chlorhexidine (PERIDEX) 0.12 % solution 15 mL, 15 mL, Mouth Rinse, BID, Karna Christmas, Kama Cammarano, MD, 15 mL at 05/20/18 0947 .   clopidogrel (PLAVIX) tablet 75 mg, 75 mg, Oral, Daily, Seals, Angela H, NP, 75 mg at 05/20/18 0946 .  famotidine (PEPCID) tablet 40 mg, 40 mg, Oral, QPM, Seals, Angela H, NP, 40 mg at 05/19/18 1746 .  FLUoxetine (PROZAC) capsule 20 mg, 20 mg, Oral, Daily, Seals, Angela H, NP, 20 mg at 05/20/18 0947 .  fluticasone (FLONASE) 50 MCG/ACT nasal spray 1 spray, 1 spray, Each Nare, Daily, Seals, Angela H, NP, 1 spray at 05/19/18 0924 .  furosemide (LASIX) injection 40 mg, 40 mg, Intravenous, Q12H, Vida Rigger, MD, 40 mg at 05/19/18 2216 .  guaiFENesin (MUCINEX) 12 hr tablet 600 mg, 600 mg, Oral, BID, Mansy, Jan A, MD, 600 mg at 05/20/18 0946 .  insulin aspart (novoLOG) injection 0-15 Units, 0-15 Units, Subcutaneous, TID WC, Vida Rigger, MD, 5 Units at 05/20/18 0745 .  insulin aspart (novoLOG) injection 0-5 Units, 0-5 Units, Subcutaneous, QHS, Vida Rigger, MD, 3 Units at 05/18/18 2124 .  ipratropium-albuterol (DUONEB) 0.5-2.5 (3) MG/3ML nebulizer solution 3 mL, 3 mL, Nebulization, Q6H, Seals, Angela H, NP, 3 mL at 05/20/18 0733 .  levofloxacin (LEVAQUIN) tablet 750 mg, 750 mg, Oral, q1800, Vida Rigger, MD, 750 mg at 05/19/18 1746 .  levothyroxine (SYNTHROID) tablet 88 mcg, 88 mcg, Oral, Daily, Seals, [place] H, NP, 88 mcg at 05/20/18 0531 .  MEDLINE mouth rinse, 15 mL, Mouth Rinse, q12n4p, Stephanny Tsutsui, MD, 15 mL at 05/19/18 1643 .  mirabegron ER (MYRBETRIQ) tablet 25 mg, 25 mg,  Oral, Daily, Seals, [place] H, NP, 25 mg at 05/20/18 7846 .  mometasone-formoterol (DULERA) 200-5 MCG/ACT inhaler 2 puff, 2 puff, Inhalation, BID, Seals, Milas Kocher, NP, 2 puff at 05/20/18 0947 .  montelukast (SINGULAIR) tablet 10 mg, 10 mg, Oral, Daily, Seals, Angela H, NP, 10 mg at 05/20/18 0947 .  morphine 2 MG/ML injection 1-2 mg, 1-2 mg, Intravenous, Q3H PRN, Harlon Ditty D, NP, 2 mg at 05/20/18 0826 .  oxyCODONE-acetaminophen (PERCOCET/ROXICET) 5-325 MG per tablet 1 tablet, 1 tablet, Oral, Q8H PRN, 1 tablet at  05/20/18 1001 **AND** oxyCODONE (Oxy IR/ROXICODONE) immediate release tablet 5 mg, 5 mg, Oral, Q8H PRN, Seals, Angela H, NP, 5 mg at 05/19/18 0809 .  sodium chloride flush (NS) 0.9 % injection 10-40 mL, 10-40 mL, Intracatheter, Q12H, Mick Sell, MD .  sodium chloride flush (NS) 0.9 % injection 10-40 mL, 10-40 mL, Intracatheter, PRN, Mick Sell, MD .  Melene Muller ON 05/21/2018] tiotropium [facility]) inhalation capsule ([facility] use ONLY) 18 mcg, 1 capsule, Inhalation, Daily, Bertram Savin, RPH .  traZODone (DESYREL) tablet 150 mg, 150 mg, Oral, QHS, Seals, Angela H, NP, 150 mg at 05/19/18 2108 .  vancomycin (VANCOCIN) 1,500 mg in sodium chloride 0.9 % 500 mL IVPB, 1,500 mg, Intravenous, Q24H, Mauri Reading, [place], Stopped at 05/20/18 9629 .  Warfarin - Pharmacist Dosing Inpatient, , Does not apply, q1800, Alford Highland, MD    ALLERGIES   Skin protectants, misc.; Flagyl [metronidazole]; Gabapentin; Levetiracetam; Tape; Ace inhibitors; Cefazolin; Ertapenem; and Sertraline hcl    REVIEW OF SYSTEMS    10 point ROS negative except for pain and sob  PHYSICAL EXAMINATION   Vitals:   05/20/18 0900 05/20/18 1000  BP: 108/76 (!) 105/56  Pulse: 98 87  Resp: 14 19  Temp:    SpO2: 91% 96%    GENERAL:chronically ill appearing.  HEAD: Normocephalic, atraumatic.  EYES: Pupils equal, round, reactive to light.  No scleral icterus.  MOUTH: Moist mucosal membrane. NECK: Supple. No thyromegaly. No nodules. No JVD.  PULMONARY: mild rhonchi at bases - improved from yesteday  CARDIOVASCULAR: S1 and S2. Regular rate and rhythm. No murmurs, rubs, or gallops.  GASTROINTESTINAL: Soft, nontender, non-distended. No masses. Positive bowel sounds. No hepatosplenomegaly.  MUSCULOSKELETAL: No swelling, clubbing, or edema.  NEUROLOGIC: Mild distress due to acute illness SKIN:intact,warm,dry   LABS AND IMAGING     LAB RESULTS: Recent Labs  Lab 05/18/18 0429 05/19/18 0412 05/20/18  0353 05/20/18 0956  NA 141 137 138  --   K 4.0 4.8 5.7* 4.5  CL 107 104 105  --   CO2 --   BUN 28* 26* 23*  --   CREATININE 0.73 0.77 0.95  --   GLUCOSE 56* 185* 160*  --    Recent Labs  Lab 05/18/18 0429 05/19/18 0412 05/20/18 0353  HGB 8.0* 7.9* 9.1*  HCT 27.4* 27.0* 30.9*  WBC 5.3 4.8 4.8  PLT 403* 361 374     IMAGING RESULTS: No results found.    ASSESSMENT AND PLAN   -Multidisciplinary rounds held today    Acuteon chronic hypoxic Respiratory Failure -Multifactorial in etiology 1. Acute decompensated diastolic CHF EF >65% Increasing diuresis today to 80 bid -I&O net even 2.Bibasilar atelectasis -aggressive bronchopulmonary hygiene, MetaNeb therapy twice daily, chest physiotherapy-discussed with respiratory therapist 3.Possible community-acquired pneumonia-urine Legionella antigen sent, strep pneumo antigennegative, respiratory specimennegative thus far, blood culturesngtd. CXR with new infiltrate compared to PA film from April 22, 2018.Currently on empiric  CAP regimen 4.COPD-without grossly apparent exacerbation -We will continue typical COPD care path with nebulizer therapy as well as flutter valve and bronchopulmonary hygiene, no need for systemic glucocorticoids at this time.    Maculopapular rash -likely due to recent prolonged IV cefazolin vs plavix, latter being less likely due to chronicity and tempo - ID consultation - appreciate input               -on vancomycin and levoquin - would favor PO regimen due to AECHF - pt had MSSA bacteremia last admit  CARDIAC FAILURE- -As above acute decompensated diastolic CHF -BNP elevated>850 -oxygen as needed -Lasix dose increased today 05/19/2018 -follow up cardiac enzymes as indicated ICU monitoring   Moderate protein calorie malnutrition -Albumin 2.4 Dietary consultation   ID- hx of bacteremia and osteomyelitis -  microbiology pending  -continue IV abx as prescibed- vanco and levoquin -follow up cultures   GI/Nutrition GI PROPHYLAXIS as indicated DIET-->TF's as tolerated Constipation protocol as indicated   ENDO - ICU hypoglycemic\Hyperglycemia protocol -check FSBS per protocol   ELECTROLYTES -follow labs as needed -replace as needed -pharmacy consultation   DVT/GI PRX ordered -SCDs  TRANSFUSIONS AS NEEDED MONITOR FSBS ASSESS the need for LABS as needed   Critical care provider statement:  Critical care time (minutes):39 Critical care time was exclusive of: Separately billable procedures and treating other patients Critical care was necessary to treat or prevent imminent or life-threatening deterioration of the following conditions:Acute hypoxemic respiratory failure, severe COPD, hyperglycemia, moderate protein calorie malnutrition, multiple comorbid conditions Critical care was time spent personally by me on the following activities: Development of treatment plan with patient or surrogate, discussions with consultants, evaluation of patient's response to treatment, examination of patient, obtaining history from patient or surrogate, ordering and performing treatments and interventions, ordering and review of laboratory studies and re-evaluation   This document was prepared using Dragon voice recognition software and may include unintentional dictation errors.    Vida RiggerFuad Jezebelle Ledwell, M.D.  Division of Pulmonary & Critical Care Medicine  [facility]

## 2018-05-20 NOTE — Clinical Social Work Note (Signed)
Pt continues to exhibit signs of hypercapnia associated with chronic respiratory failure secondary to severe COPD. Patient requires the use of NIV both QHD and daytime to help with exacerbation periods. The use of the NIV will treat patient's high PC02 levels and can reduce risk of exacerbations and future hospitalizations when used at night and during the day. Pt will need these advanced settings in conjunction with her current medication regimen; BIPAP is not an option due to its functional limitations and the severity of the patient's condition. Failure to have NIV available for use over a 24 hour period could lead to death.

## 2018-05-20 DEATH — deceased

## 2018-05-21 ENCOUNTER — Inpatient Hospital Stay: Admit: 2018-05-21 | Discharge: 2018-05-21 | Disposition: A | Discharge status: Home / Self Care | Payer: Medicare Other

## 2018-05-21 LAB — CULTURE, BLOOD (ROUTINE X 2)
Culture: NO GROWTH
Culture: NO GROWTH

## 2018-05-21 LAB — BASIC METABOLIC PANEL
Anion gap: 7 (ref 5–15)
BUN: 29 mg/dL — ABNORMAL HIGH (ref 6–20)
CO2: 29 mmol/L (ref 22–32)
Calcium: 7.7 mg/dL — ABNORMAL LOW (ref 8.9–10.3)
Chloride: 102 mmol/L (ref 98–111)
Creatinine, Ser: 0.86 mg/dL (ref 0.44–1.00)
GFR calc Af Amer: >60 mL/min (ref >60)
GFR calc non Af Amer: >60 mL/min (ref >60)
Glucose, Bld: 206 mg/dL — ABNORMAL HIGH (ref 70–99)
Potassium: 4.5 mmol/L (ref 3.5–5.1)
Sodium: 138 mmol/L (ref 135–145)

## 2018-05-21 LAB — CBC
HCT: 24.8 % — ABNORMAL LOW (ref 36.0–46.0)
Hemoglobin: 7.5 g/dL — ABNORMAL LOW (ref 12.0–15.0)
MCH: 26.7 pg (ref 26.0–34.0)
MCHC: 30.2 g/dL (ref 30.0–36.0)
MCV: 88.3 fL (ref 80.0–100.0)
Platelets: 336 10*3/uL (ref 150–400)
RBC: 2.81 MIL/uL — ABNORMAL LOW (ref 3.87–5.11)
RDW: 21.6 % — ABNORMAL HIGH (ref 11.5–15.5)
WBC: 5.2 10*3/uL (ref 4.0–10.5)
nRBC: 0 % (ref 0.0–0.2)

## 2018-05-21 LAB — GLUCOSE, CAPILLARY
Glucose-Capillary: 264 mg/dL — ABNORMAL HIGH (ref 70–99)
Glucose-Capillary: 256 mg/dL — ABNORMAL HIGH (ref 70–99)
Glucose-Capillary: 208 mg/dL — ABNORMAL HIGH (ref 70–99)
Glucose-Capillary: 201 mg/dL — ABNORMAL HIGH (ref 70–99)

## 2018-05-21 LAB — PROTIME-INR
INR: 1.4 — ABNORMAL HIGH (ref 0.8–1.2)
Prothrombin Time: 16.7 seconds — ABNORMAL HIGH (ref 11.4–15.2)

## 2018-05-21 MED ORDER — POLYETHYLENE GLYCOL 3350 17 G PO PACK
17.0000 g | PACK | Freq: Every day | ORAL | Status: DC
  Administered 2018-05-21 – 2018-05-26 (×5): 17 g via ORAL
  Filled 2018-05-21 (×6): qty 1

## 2018-05-21 MED ORDER — DOCUSATE SODIUM 100 MG PO CAPS
100.0000 mg | ORAL_CAPSULE | Freq: Two times a day (BID) | ORAL | Status: DC
  Administered 2018-05-21 – 2018-05-26 (×9): 100 mg via ORAL
  Filled 2018-05-21 (×9): qty 1

## 2018-05-21 MED ORDER — WARFARIN SODIUM 7.5 MG PO TABS
7.5000 mg | ORAL_TABLET | Freq: Once | ORAL | Status: AC
  Administered 2018-05-21: 18:00:00 7.5 mg via ORAL
  Filled 2018-05-21: qty 1

## 2018-05-21 MED ORDER — FUROSEMIDE 10 MG/ML IJ SOLN
80.0000 mg | Freq: Two times a day (BID) | INTRAMUSCULAR | Status: DC
  Administered 2018-05-21 – 2018-05-23 (×4): 80 mg via INTRAVENOUS
  Filled 2018-05-21 (×4): qty 8

## 2018-05-21 NOTE — Progress Notes (Signed)
*  PRELIMINARY RESULTS* Echocardiogram 2D Echocardiogram has been performed.  Alexandra Goodman 05/21/2018, 2:50 PM

## 2018-05-21 NOTE — Progress Notes (Signed)
CRITICAL CARE NOTE      SUBJECTIVE FINDINGS & SIGNIFICANT EVENTS   Patient clinically improved.     PAST MEDICAL HISTORY   Past Medical History:  Diagnosis Date  . Asthma   . CHF (congestive heart failure) (HCC)   . Chronic pain   . COPD (chronic obstructive pulmonary disease) (HCC)   . Diabetes mellitus (HCC)   . Peripheral vascular disease (HCC)   . Stroke (cerebrum) (HCC)      SURGICAL HISTORY   Past Surgical History:  Procedure Laterality Date  . below the knee LLE amputation Left      [facility] Rhonda [facility]   Family History  Problem Relation Age of Onset  . Heart disease Mother   . Heart disease Father      SOCIAL HISTORY   Social History   Tobacco Use  . Smoking status: Current Every Day Smoker    Packs/day: 2.00    Years: 45.00    Pack years: 90.00    Types: Cigarettes  . Smokeless tobacco: Never Used  Substance Use Topics  . Alcohol use: Not Currently  . Drug use: Never     MEDICATIONS   Current Medication:  Current Facility-Administered Medications:  .  albuterol (PROVENTIL) (2.5 MG/3ML) 0.083% nebulizer solution 2.5 mg, 2.5 mg, Nebulization, Q4H PRN, Seals, Milas KocherAngela H, NP .  ALPRAZolam Prudy Feeler(XANAX) tablet 0.5 mg, 0.5 mg, Oral, BID PRN, Harlon DittyKeene, Jeremiah D, NP, 0.5 mg at 05/21/18 0531 .  amiodarone (PACERONE) tablet 200 mg, 200 mg, Oral, BID, Seals, Angela H, NP, 200 mg at 05/20/18 2127 .  aspirin EC tablet 81 mg, 81 mg, Oral, Daily, Seals, [place] H, NP, 81 mg at 05/20/18 0946 .  atorvastatin (LIPITOR) tablet 80 mg, 80 mg, Oral, QHS, Seals, Angela H, NP, 80 mg at 05/20/18 2127 .  chlorhexidine (PERIDEX) 0.12 % solution 15 mL, 15 mL, Mouth Rinse, BID, Karna ChristmasAleskerov, Zhyon Antenucci, MD, 15 mL at 05/20/18 0947 .  clopidogrel (PLAVIX) tablet 75 mg, 75 mg, Oral, Daily, Seals, Angela H, NP, 75 mg at  05/20/18 0946 .  famotidine (PEPCID) tablet 40 mg, 40 mg, Oral, QPM, Seals, Angela H, NP, 40 mg at 05/20/18 1741 .  FLUoxetine (PROZAC) capsule 20 mg, 20 mg, Oral, Daily, Seals, Angela H, NP, 20 mg at 05/20/18 0947 .  fluticasone (FLONASE) 50 MCG/ACT nasal spray 1 spray, 1 spray, Each Nare, Daily, Seals, Angela H, NP, 1 spray at 05/19/18 [phone] .  guaiFENesin (MUCINEX) 12 hr tablet 600 mg, 600 mg, Oral, BID, Mansy, Jan A, MD, 600 mg at 05/20/18 2127 .  insulin aspart (novoLOG) injection 0-15 Units, 0-15 Units, Subcutaneous, TID WC, Vida RiggerAleskerov, Hever Castilleja, MD, 8 Units at 05/20/18 1622 .  insulin aspart (novoLOG) injection 0-5 Units, 0-5 Units, Subcutaneous, QHS, Vida RiggerAleskerov, Anice Wilshire, MD, 3 Units at 05/20/18 2139 .  ipratropium-albuterol (DUONEB) 0.5-2.5 (3) MG/3ML nebulizer solution 3 mL, 3 mL, Nebulization, Q6H, Seals, Angela H, NP, 3 mL at 05/21/18 0148 .  levofloxacin (LEVAQUIN) tablet 750 mg, 750 mg, Oral, q1800, Mauri ReadingMartin, Savanna M, RPH, 750 mg at 05/20/18 1741 .  levothyroxine (SYNTHROID) tablet 88 mcg, 88 mcg, Oral, Daily, Seals, Milas Kocherngela H, NP, 88 mcg at 05/21/18 0529 .  MEDLINE mouth rinse, 15 mL, Mouth Rinse, q12n4p, Yeila Morro, MD, 15 mL at 05/20/18 1624 .  mirabegron ER (MYRBETRIQ) tablet 25 mg, 25 mg, Oral, Daily, Seals, Angela H, NP, 25 mg at 05/20/18 0948 .  mometasone-formoterol (DULERA) 200-5 MCG/ACT inhaler 2 puff, 2 puff, Inhalation, BID, Seals, Milas KocherAngela H, NP,  2 puff at 05/20/18 2131 .  montelukast (SINGULAIR) tablet 10 mg, 10 mg, Oral, Daily, Seals, Angela H, NP, 10 mg at 05/20/18 0947 .  morphine 2 MG/ML injection 1-2 mg, 1-2 mg, Intravenous, Q3H PRN, Harlon Ditty D, NP, 2 mg at 05/21/18 0420 .  multivitamin with minerals tablet 1 tablet, 1 tablet, Oral, Daily, Wieting, Richard, MD .  nystatin (MYCOSTATIN) 100000 UNIT/ML suspension 500,000 Units, 5 mL, Oral, QID, Vida Rigger, MD, 500,000 Units at 05/20/18 2125 .  oxyCODONE-acetaminophen (PERCOCET/ROXICET) 5-325 MG per tablet 1 tablet, 1  tablet, Oral, Q8H PRN, 1 tablet at 05/20/18 1001 **AND** oxyCODONE (Oxy IR/ROXICODONE) immediate release tablet 5 mg, 5 mg, Oral, Q8H PRN, Seals, Angela H, NP, 5 mg at 05/19/18 0809 .  protein supplement (ENSURE MAX) liquid, 11 oz, Oral, BID, Renae Gloss, Richard, MD, 11 oz at 05/20/18 1445 .  sodium chloride flush (NS) 0.9 % injection 10-40 mL, 10-40 mL, Intracatheter, Q12H, Mick Sell, MD, 10 mL at 05/20/18 2129 .  sodium chloride flush (NS) 0.9 % injection 10-40 mL, 10-40 mL, Intracatheter, PRN, Mick Sell, MD .  tiotropium [facility]) inhalation capsule ([facility] use ONLY) 18 mcg, 1 capsule, Inhalation, Daily, Bertram Savin, RPH .  traZODone (DESYREL) tablet 150 mg, 150 mg, Oral, QHS, Seals, Angela H, NP, 150 mg at 05/20/18 2126 .  vancomycin (VANCOCIN) 1,250 mg in sodium chloride 0.9 % 250 mL IVPB, 1,250 mg, Intravenous, Q24H, Mauri Reading, [place] .  vitamin C (ASCORBIC ACID) tablet 250 mg, 250 mg, Oral, BID, Renae Gloss, Richard, MD, 250 mg at 05/20/18 2126 .  warfarin (COUMADIN) tablet 7.5 mg, 7.5 mg, Oral, ONCE-1800, Hallaji, Sheema M, RPH .  Warfarin - Pharmacist Dosing Inpatient, , Does not apply, q1800, Alford Highland, MD    ALLERGIES   Skin protectants, misc.; Flagyl [metronidazole]; Gabapentin; Levetiracetam; Tape; Ace inhibitors; Cefazolin; Ertapenem; and Sertraline hcl    REVIEW OF SYSTEMS   10 point ROS negative except as per subjective hx   PHYSICAL EXAMINATION   Vitals:   05/21/18 0500 05/21/18 0600  BP: 119/60 (!) 110/54  Pulse: 93 89  Resp: 12 16  Temp:    SpO2: 94% 97%    GENERAL:chronically ill appearing.  HEAD: Normocephalic, atraumatic.  EYES: Pupils equal, round, reactive to light.  No scleral icterus.  MOUTH: Moist mucosal membrane. NECK: Supple. No thyromegaly. No nodules. No JVD.  PULMONARY: bilateral rhonchi  CARDIOVASCULAR: S1 and S2. Regular rate and rhythm. No murmurs, rubs, or gallops.  GASTROINTESTINAL: Soft, nontender,  non-distended. No masses. Positive bowel sounds. No hepatosplenomegaly.  MUSCULOSKELETAL: No swelling, clubbing, or edema.  NEUROLOGIC: Mild distress due to acute illness SKIN:intact,warm,dry   LABS AND IMAGING     LAB RESULTS: Recent Labs  Lab 05/19/18 0412 05/20/18 0353 05/20/18 0956 05/21/18 0408  NA 137 138  --  138  K 4.8 5.7* 4.5 4.5  CL 104 105  --  102  CO2 29 27  --  29  BUN 26* 23*  --  29*  CREATININE 0.77 0.95  --  0.86  GLUCOSE 185* 160*  --  206*   Recent Labs  Lab 05/19/18 0412 05/20/18 0353 05/21/18 0408  HGB 7.9* 9.1* 7.5*  HCT 27.0* 30.9* 24.8*  WBC 4.8 4.8 5.2  PLT 361 374 336     IMAGING RESULTS: Dg Chest Port 1 View  Result Date: 05/20/2018 CLINICAL DATA:  Acute respiratory failure EXAM: PORTABLE CHEST 1 VIEW COMPARISON:  May 19, 2018 FINDINGS: The heart size and  mediastinal contours are stable. Left PICC line is identified unchanged compared prior exam. Increased pulmonary interstitium is identified bilaterally slightly improved. Patchy consolidation is identified in bilateral lung bases unchanged. The visualized skeletal structures are stable. IMPRESSION: Increased pulmonary interstitium in both lungs are slightly improved compared to prior exam. Patchy consolidation of bilateral lung bases are unchanged compared prior exam. Bilateral pleural effusions are probably unchanged. Electronically Signed   By: Sherian Rein M.D.   On: 05/20/2018 12:04      ASSESSMENT AND PLAN   Multidisciplinary rounds held today    Acuteon chronic hypoxic Respiratory Failure -Multifactorial in etiology  1. Acute decompensated diastolic CHF EF >65% Increasing diuresis todayto 80 bid -I&O net even 2.Bibasilar atelectasis -aggressive bronchopulmonary hygiene, MetaNeb therapy twice daily, chest physiotherapy-discussed with respiratory therapist 3.Possible community-acquired pneumonia-urine Legionella antigen sent, strep pneumo  antigennegative, respiratory specimennegative thus far, blood culturesngtd. CXR with new infiltrate compared to PA film from April 22, 2018.Currently on empiric CAP regimen 4.COPD-without grossly apparent exacerbation -We will continue typical COPD care path with nebulizer therapy as well as flutter valve and bronchopulmonary hygiene, no need for systemic glucocorticoids at this time.     Maculopapular rash -likely due to recent prolonged IV cefazolin vs plavix, latter being less likely due to chronicity and tempo - ID consultation - appreciate input               -on vancomycin and levoquin - would favor PO regimen due to AECHF - pt had MSSA bacteremia last admit    CARDIAC FAILURE- -As above acute decompensated diastolic CHF -BNP elevated>850 -oxygen as needed -Lasixdose increased today 05/19/2018 -follow up cardiac enzymes as indicated ICU monitoring    Moderate protein calorie malnutrition -Albumin 2.4 Dietary consultation    ID- hx of bacteremia and osteomyelitis - microbiology pending  -continue IV abx as prescibed- vanco and levoquin -follow up cultures   GI/Nutrition GI PROPHYLAXIS as indicated DIET-->TF's as tolerated Constipation protocol as indicated   ENDO - ICU hypoglycemic\Hyperglycemia protocol -check FSBS per protocol   ELECTROLYTES -follow labs as needed -replace as needed -pharmacy consultation   DVT/GI PRX ordered -SCDs  TRANSFUSIONS AS NEEDED MONITOR FSBS ASSESS the need for LABS as needed     Critical care provider statement:  Critical care time (minutes):39 Critical care time was exclusive of: Separately billable procedures and treating other patients Critical care was necessary to treat or prevent imminent or life-threatening deterioration of the following conditions:Acute hypoxemic respiratory failure, severe COPD, hyperglycemia, moderate protein calorie  malnutrition, multiple comorbid conditions Critical care was time spent personally by me on the following activities: Development of treatment plan with patient or surrogate, discussions with consultants, evaluation of patient's response to treatment, examination of patient, obtaining history from patient or surrogate, ordering and performing treatments and interventions, ordering and review of laboratory studies and re-evaluation     This document was prepared using Dragon voice recognition software and may include unintentional dictation errors.    Vida Rigger, M.D.  Division of Pulmonary & Critical Care Medicine  [facility]

## 2018-05-21 NOTE — Progress Notes (Signed)
ECHO being completed at bedside at this time.

## 2018-05-21 NOTE — Consult Note (Signed)
Pharmacy Antibiotic Note  Alexandra Goodman is a 58 y.o. female admitted on 05/06/2018 with COPD exacerbation. Patient was recently discharged with MSSA bacteremia and received cefazolin via PICC at home. Patient now has rash thought to be attributed to cefazolin. ID is consulted.  Pharmacy has been consulted for Vancomycin dosing.  Plan: Continue Vancomycin 1250 mg q24h  Vancomycin Kinetics Using Scr 0.86, IBW, and Vd 0.72 Goal AUC 400-550 Anticipated AUC 485  Will hold off on ordering levels, but anticipate getting levels on 5/3 pending stable dose. Will continue to monitor.  Continue Levofloxacin 750 mg IV q24h for PNA.  Height: 5\' 3"  (160 cm) Weight: 154 lb 12.2 oz (70.2 kg) IBW/kg (Calculated) : 52.4  Temp (24hrs), Avg:98.1 F (36.7 C), Min:97.7 F (36.5 C), Max:98.4 F (36.9 C)  Recent Labs  Lab 04/20/2018 0428 05/17/2018 0626 05/17/18 0642 05/18/18 0429 05/19/18 0412 05/20/18 0353 05/21/18 0408  WBC 14.7*  --  8.7 5.3 4.8 4.8 5.2  CREATININE 1.02*  --  1.09* 0.73 0.77 0.95 0.86  LATICACIDVEN 2.3* 2.1*  --   --   --   --   --     Estimated Creatinine Clearance: 67.8 mL/min (by C-G formula based on SCr of 0.86 mg/dL).    Allergies  Allergen Reactions  . Skin Protectants, Misc. Rash  . Flagyl [Metronidazole]   . Gabapentin   . Levetiracetam   . Tape Other (See Comments)    blisters  . Ace Inhibitors Rash  . Cefazolin Rash    Rash developed on cefazolin while on long-term treatment (was on 3-4wks)  . Ertapenem Rash  . Sertraline Hcl Rash    Antimicrobials this admission: 4/27 Azithromycin x 1 4/27 Cefepime x 1 4/27 Vancomycin >> 4/27 Levofloxacin >> 5/2  Dose adjustments this admission: 4/28 Decreased Vancomycin from 1250 mg q24 to 1750 mg q36 (starting 4/29) 4/30 Increase Vancomycin to 1500 mg IV q24h from 1750 mg IV q36h starting 5/1 5/1 Decrease Vancomycin from 1500 mg IV q24h to 1250 mg q24h  Microbiology results: 4/27 BCx: NG x 4 days 4/27 Sputum:  no WBC 4/27 MRSA PCR: (-) 4/27 UCx NG 4/27 COVID (-)  Thank you for allowing pharmacy to be a part of this patient's care.  Gardner Candle, PharmD, BCPS Clinical Pharmacist 05/21/2018 6:57 AM

## 2018-05-21 NOTE — Progress Notes (Signed)
Patient ID: Alexandra Goodman, female   DOB: 18-Apr-1960, 58 y.o.   MRN: [id number] [facility] CLINIC INFECTIOUS DISEASE PROGRESS NOTE Date of Admission:  04/22/2018     ID: Alexandra Goodman is a 58 y.o. female with MSSA bacteremia, Acute resp failure/COPD, rash  Active Problems:   Acute exacerbation of chronic obstructive pulmonary disease (COPD) (HCC)   Pneumonia   Subjective: No fevers, breathing still on bipap alt with hi flo. Having trouble with diuresis due to low BPs but improving.  Rash seems to be a little worse but possibly due to the edema.  ROS  11 systems reviewed and negative except as per HPI  Medications:  Antibiotics Given (last 72 hours)    Date/Time Action Medication Dose Rate   05/18/18 1831 Given   levofloxacin (LEVAQUIN) tablet 750 mg 750 mg    05/18/18 2104 New Bag/Given   vancomycin (VANCOCIN) 1,750 mg in sodium chloride 0.9 % 500 mL IVPB 1,750 mg 250 mL/hr   05/19/18 1746 Given   levofloxacin (LEVAQUIN) tablet 750 mg 750 mg    05/20/18 0749 New Bag/Given   vancomycin (VANCOCIN) 1,500 mg in sodium chloride 0.9 % 500 mL IVPB 1,500 mg 250 mL/hr   05/20/18 1741 Given   levofloxacin (LEVAQUIN) tablet 750 mg 750 mg    05/21/18 0803 New Bag/Given   vancomycin (VANCOCIN) 1,250 mg in sodium chloride 0.9 % 250 mL IVPB 1,250 mg 166.7 mL/hr     . amiodarone  200 mg Oral BID  . aspirin EC  81 mg Oral Daily  . atorvastatin  80 mg Oral QHS  . chlorhexidine  15 mL Mouth Rinse BID  . clopidogrel  75 mg Oral Daily  . docusate sodium  100 mg Oral BID  . famotidine  40 mg Oral QPM  . FLUoxetine  20 mg Oral Daily  . fluticasone  1 spray Each Nare Daily  . guaiFENesin  600 mg Oral BID  . insulin aspart  0-15 Units Subcutaneous TID WC  . insulin aspart  0-5 Units Subcutaneous QHS  . ipratropium-albuterol  3 mL Nebulization Q6H  . levofloxacin  750 mg Oral q1800  . levothyroxine  88 mcg Oral Daily  . mouth rinse  15 mL Mouth Rinse q12n4p  . mirabegron ER  25 mg Oral Daily  .  mometasone-formoterol  2 puff Inhalation BID  . montelukast  10 mg Oral Daily  . multivitamin with minerals  1 tablet Oral Daily  . nystatin  5 mL Oral QID  . polyethylene glycol  17 g Oral Daily  . Ensure Max Protein  11 oz Oral BID  . sodium chloride flush  10-40 mL Intracatheter Q12H  . tiotropium  1 capsule Inhalation Daily  . traZODone  150 mg Oral QHS  . vitamin C  250 mg Oral BID  . warfarin  7.5 mg Oral ONCE-1800  . Warfarin - Pharmacist Dosing Inpatient   Does not apply q1800    Objective: Vital signs in last 24 hours: Temp:  [97.6 F (36.4 C)-98.1 F (36.7 C)] 97.6 F (36.4 C) (05/02 0800) Pulse Rate:  [65-115] 115 (05/02 1200) Resp:  [12-22] 16 (05/02 1200) BP: (103-147)/(51-87) 123/71 (05/02 1200) SpO2:  [92 %-100 %] 93 % (05/02 1200) FiO2 (%):  [50 %] 50 % (05/02 0200) Weight:  [70.2 kg] 70.2 kg (05/02 0418) Constitutional:  Awake and interactive, disheveled. On high flow O2 HENT: [place]/AT, PERRLA, no scleral icterus Mouth/Throat: Oropharynx is clear and moist. No oropharyngeal exudate.  Cardiovascular: Normal rate, regular rhythm and normal heart sounds. Pulmonary/Chest:  still poor air movement.  Mild wheeze. Neck = supple, no nuchal rigidity Abdominal: Soft. Bowel sounds are normal.  exhibits no distension. There is no tenderness.  Lymphadenopathy: no cervical adenopathy. No axillary adenopathy Neurological: alert and oriented to person, place, and time.  Skin: on her arms and legs she has numerous bright red, almost targetoid lesions. No drainage, or purulence. Spares palms and soles.  Psychiatric: flat affect, slowed mentation   Lab Results Recent Labs    05/20/18 0353 05/20/18 0956 05/21/18 0408  WBC 4.8  --  5.2  HGB 9.1*  --  7.5*  HCT 30.9*  --  24.8*  NA 138  --  138  K 5.7* 4.5 4.5  CL 105  --  102  CO2 27  --  29  BUN 23*  --  29*  CREATININE 0.95  --  0.86    Microbiology: Results for orders placed or performed during the hospital  encounter of 05/15/2018  SARS Coronavirus 2 [facility](Hospital order, Performed in [facility] hospital lab)     Status: None   Collection Time: 04/29/2018  4:28 AM  Result Value Ref Range Status   SARS Coronavirus 2 NEGATIVE NEGATIVE Final    Comment: (NOTE) If result is NEGATIVE SARS-CoV-2 target nucleic acids are NOT DETECTED. The SARS-CoV-2 RNA is generally detectable in upper and lower  respiratory specimens during the acute phase of infection. The lowest  concentration of SARS-CoV-2 viral copies this assay can detect is 250  copies / mL. A negative result does not preclude SARS-CoV-2 infection  and should not be used as the sole basis for treatment or other  patient management decisions.  A negative result may occur with  improper specimen collection / handling, submission of specimen other  than nasopharyngeal swab, presence of viral mutation(s) within the  areas targeted by this assay, and inadequate number of viral copies  (<250 copies / mL). A negative result must be combined with clinical  observations, patient history, and epidemiological information. If result is POSITIVE SARS-CoV-2 target nucleic acids are DETECTED. The SARS-CoV-2 RNA is generally detectable in upper and lower  respiratory specimens dur ing the acute phase of infection.  Positive  results are indicative of active infection with SARS-CoV-2.  Clinical  correlation with patient history and other diagnostic information is  necessary to determine patient infection status.  Positive results do  not rule out bacterial infection or co-infection with other viruses. If result is PRESUMPTIVE POSTIVE SARS-CoV-2 nucleic acids MAY BE PRESENT.   A presumptive positive result was obtained on the submitted specimen  and confirmed on repeat testing.  While 2019 novel coronavirus  (SARS-CoV-2) nucleic acids may be present in the submitted sample  additional confirmatory testing may be necessary for epidemiological  and / or clinical  management purposes  to differentiate between  SARS-CoV-2 and other Sarbecovirus currently known to infect humans.  If clinically indicated additional testing with an alternate test  methodology [phone]([id number]) is advised. The SARS-CoV-2 RNA is generally  detectable in upper and lower respiratory sp ecimens during the acute  phase of infection. The expected result is Negative. Fact Sheet for Patients:  BoilerBrush.com.cyhttps://www.fda.gov/media/136312/download Fact Sheet for Healthcare Providers: https://pope.com/https://www.fda.gov/media/136313/download This test is not yet approved or cleared by the [place] FDA and has been authorized for detection and/or diagnosis of SARS-CoV-2 by FDA under an Emergency Use Authorization (EUA).  This EUA will remain in effect (meaning this test  can be used) for the duration of the COVID-19 declaration under Section 564(b)(1) of the Act, 21 U.S.C. section 360bbb-3(b)(1), unless the authorization is terminated or revoked sooner. Performed at [facility], [address] [place], [place] [address]   Blood Culture (routine x 2)     Status: None   Collection Time: 04/23/2018  4:51 AM  Result Value Ref Range Status   Specimen Description BLOOD RIGHT FOREARM  Final   Special Requests   Final    BOTTLES DRAWN AEROBIC AND ANAEROBIC Blood Culture results may not be optimal due to an excessive volume of blood received in culture bottles   Culture   Final    NO GROWTH 5 DAYS Performed at [facility], [address] [place], [place] [address]    Report Status 05/21/2018 FINAL  Final  Blood Culture (routine x 2)     Status: None   Collection Time: 05/02/2018  4:51 AM  Result Value Ref Range Status   Specimen Description BLOOD RIGHT HAND  Final   Special Requests   Final    BOTTLES DRAWN AEROBIC AND ANAEROBIC Blood Culture results may not be optimal due to an inadequate volume of blood received in culture bottles   Culture   Final    NO GROWTH 5 DAYS Performed at  [facility], [address] [place], [place] [address]    Report Status 05/21/2018 FINAL  Final  MRSA PCR Screening     Status: None   Collection Time: 04/25/2018  5:53 PM  Result Value Ref Range Status   MRSA by PCR NEGATIVE NEGATIVE Final    Comment:        The GeneXpert MRSA Assay (FDA approved for NASAL specimens only), is one component of a comprehensive MRSA colonization surveillance program. It is not intended to diagnose MRSA infection nor to guide or monitor treatment for MRSA infections. Performed at [facility], [address] [place], [place] [address]   Culture, sputum-assessment     Status: None   Collection Time: 04/23/2018  8:00 PM  Result Value Ref Range Status   Specimen Description SPUTUM  Final   Special Requests NONE  Final   Sputum evaluation   Final    THIS SPECIMEN IS ACCEPTABLE FOR SPUTUM CULTURE Performed at [facility], [address], [place], [place] [address]    Report Status 05/08/2018 FINAL  Final  Culture, respiratory     Status: None   Collection Time: 05/05/2018  8:00 PM  Result Value Ref Range Status   Specimen Description   Final    SPUTUM Performed at [facility], [address], [place], [place] [address]    Special Requests   Final    NONE Reflexed from [id number] Performed at [facility], [address] [place], [place] [address]    Gram Stain   Final    FEW SQUAMOUS EPITHELIAL CELLS PRESENT NO WBC SEEN FEW YEAST Performed at [facility], [place] [address], [place], [place] [address]    Culture   Final    ABUNDANT CANDIDA ALBICANS FEW CANDIDA TROPICALIS    Report Status 05/19/2018 FINAL  Final  Urine Culture     Status: None   Collection Time: 04/20/2018  9:10 PM  Result Value Ref Range Status   Specimen Description   Final    URINE, CLEAN CATCH Performed at [facility], [address] [place], [place] [address]    Special Requests  Final     Normal Performed at [facility], [address] [place], [place] [address]    Culture   Final    NO GROWTH Performed at [facility], [place] [address] [place], [place] [address]    Report Status 05/18/2018 FINAL  Final    Studies/Results: Dg Chest Port 1 View  Result Date: 05/20/2018 CLINICAL DATA:  Acute respiratory failure EXAM: PORTABLE CHEST 1 VIEW COMPARISON:  May 19, 2018 FINDINGS: The heart size and mediastinal contours are stable. Left PICC line is identified unchanged compared prior exam. Increased pulmonary interstitium is identified bilaterally slightly improved. Patchy consolidation is identified in bilateral lung bases unchanged. The visualized skeletal structures are stable. IMPRESSION: Increased pulmonary interstitium in both lungs are slightly improved compared to prior exam. Patchy consolidation of bilateral lung bases are unchanged compared prior exam. Bilateral pleural effusions are probably unchanged. Electronically Signed   By: Abelardo Diesel M.D.   On: 05/20/2018 12:04    Assessment/Plan: ANASTAZJA ISAAC is a 58 y.o. 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 CXR with interstitial CHF pattern on top of chronic COPD and possible new lung infiltrates.  Neg COVID, flu.  Rippey ngtd. She has an impressive rash which has a erythema multiforme appearance and is likely due to the cefazolin.  She has no oral lesions I have noted.  April 29- she is clinically improving from a respiratory point of view and is off BiPAP onto high flow.  Her rash is improving. 4/ 30 - no fevers, off high flo o2 5/2 - difficulty with diuresis, rash a little worse Recommendations Rash - Her rash seems to be worsening after was improving  since stopping beta-lactam use. Perhaps it is due to the edema but seems redder and some bulla.  Would ask derm to see on Monday  MSSA bacteremia  and discitis Cont vanco for the MSSA bacteremia.  We could consider using once a day daptomycin at discharge. Will  Repeat echo given some issues with worsening CHF   Pulmonary -Finished today with Levofloxacin for CAP. Continue management of COPD and CHF exacerbation per pulmonary.   Thank you very much for the consult. Will follow with you.  Leonel Ramsay   05/21/2018, 12:26 PM

## 2018-05-21 NOTE — Consult Note (Addendum)
ANTICOAGULATION CONSULT NOTE  Pharmacy Consult for Warfarin Indication: Previous DVT  Allergies  Allergen Reactions  . Skin Protectants, Misc. Rash  . Flagyl [Metronidazole]   . Gabapentin   . Levetiracetam   . Tape Other (See Comments)    blisters  . Ace Inhibitors Rash  . Cefazolin Rash    Rash developed on cefazolin while on long-term treatment (was on 3-4wks)  . Ertapenem Rash  . Sertraline Hcl Rash    Patient Measurements: Height: 5\' 3"  (160 cm) Weight: 154 lb 12.2 oz (70.2 kg) IBW/kg (Calculated) : 52.4   Vital Signs: Temp: 98 F (36.7 C) (05/02 0200) Temp Source: Oral (05/02 0200) BP: 110/54 (05/02 0600) Pulse Rate: 89 (05/02 0600)  Labs: Recent Labs    05/19/18 0412 05/20/18 0353 05/21/18 0408  HGB 7.9* 9.1* 7.5*  HCT 27.0* 30.9* 24.8*  PLT 361 374 336  LABPROT 25.7* 16.7* 16.7*  INR 2.4* 1.4* 1.4*  CREATININE 0.77 0.95 0.86    Estimated Creatinine Clearance: 67.8 mL/min (by C-G formula based on SCr of 0.86 mg/dL).   Medical History: Past Medical History:  Diagnosis Date  . Asthma   . CHF (congestive heart failure) (HCC)   . Chronic pain   . COPD (chronic obstructive pulmonary disease) (HCC)   . Diabetes mellitus (HCC)   . Peripheral vascular disease (HCC)   . 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 Patient takes amiodarone 200 mg PO BID PTA (continued inpatient).  Assessment: 58 y.o. female admitted on 06-10-2018 with COPD exacerbation. Patient was recently discharged with pneumonia and received cefazolin via PICC at home. Patient antibiotic regimen changed to levofloxacin and vancomycin.   4/27 INR 1.9 Ordered Warfarin 8.5 mg 4/28 INR 2.6 Held 4/29 INR 3.9, hold warfarin  4/30 INR 2.4 Warfarin 3 mg 5/1   INR 1.4 Warfarin 7.5 mg  Goal of Therapy:  INR 2-3 Monitor platelets by anticoagulation protocol: Yes   Plan:  5/2   INR 1.4. Hgb: 9.1>> 7.5-  continue to montior.  Patient appears to be very sensitive to warfarin dose adjustments.  Will order home dose of warfarin 7.5 mg @ 1800 again today 1800. Will obtain INR with am labs.   Pharmacy will continue to monitor  Gardner Candle, PharmD, BCPS Clinical Pharmacist 05/21/2018 6:48 AM

## 2018-05-21 NOTE — Progress Notes (Signed)
PHARMACY CONSULT NOTE - FOLLOW UP  Pharmacy Consult for Electrolyte Monitoring and Replacement   Recent Labs: Potassium (mmol/L)  Date Value  05/21/2018 4.5  01/15/2013 4.2   Magnesium (mg/dL)  Date Value  09/38/1829 2.2  01/14/2013 1.8   Calcium (mg/dL)  Date Value  93/71/6967 7.7 (L)   Calcium, Total (mg/dL)  Date Value  89/38/1017 8.8   Albumin (g/dL)  Date Value  51/02/5850 2.4 (L)  01/14/2013 2.4 (L)   Phosphorus (mg/dL)  Date Value  77/82/4235 3.6   Sodium (mmol/L)  Date Value  05/21/2018 138  01/15/2013 137    Assessment: Patient admitted with respiratory failure/COPD exacerbation.  Goal of Therapy:  Potassium ~4.0, Magnesium ~2.0  Plan:  Electrolytes WNL. No replacement warranted at this time.   Will obtain BMP with am labs.   Pharmacy will continue to monitor and adjust per consult.    Gardner Candle, PharmD, BCPS Clinical Pharmacist 05/21/2018 6:46 AM

## 2018-05-21 NOTE — Progress Notes (Signed)
[facility] [place] NAME: Alexandra Goodman    MR#:  [id number]  DATE OF BIRTH:  03/07/1960  SUBJECTIVE:   Patient's respiratory status has improved.  Remained on BiPAP overnight but currently on nasal cannula O2.  Continues to have a purpuric rash consistent with erythema multiforme on her arms and her legs.  No other acute events overnight.  REVIEW OF SYSTEMS:    Review of Systems  Constitutional: Negative for chills and fever.  HENT: Negative for congestion and tinnitus.   Eyes: Negative for blurred vision and double vision.  Respiratory: Positive for shortness of breath. Negative for cough and wheezing.   Cardiovascular: Negative for chest pain, orthopnea and PND.  Gastrointestinal: Positive for constipation. Negative for abdominal pain, diarrhea, nausea and vomiting.  Genitourinary: Negative for dysuria and hematuria.  Skin: Positive for rash.  Neurological: Positive for weakness (generalized. ). Negative for dizziness, sensory change and focal weakness.  All other systems reviewed and are negative.   Nutrition: Carb control Tolerating Diet: Yes Tolerating PT: Await Eval.   DRUG ALLERGIES:   Allergies  Allergen Reactions  . Skin Protectants, Misc. Rash  . Flagyl [Metronidazole]   . Gabapentin   . Levetiracetam   . Tape Other (See Comments)    blisters  . Ace Inhibitors Rash  . Cefazolin Rash    Rash developed on cefazolin while on long-term treatment (was on 3-4wks)  . Ertapenem Rash  . Sertraline Hcl Rash    VITALS:  Blood pressure 111/64, pulse (!) 102, temperature 97.6 F (36.4 C), temperature source Oral, resp. rate 17, height 5\' 3"  (1.6 m), weight 70.2 kg, SpO2 94 %.  PHYSICAL EXAMINATION:   Physical Exam  GENERAL:  58 y.o.-year-old patient sitting up in bed in no acute distress.  EYES: Pupils equal, round, reactive to light and accommodation. No scleral icterus. Extraocular muscles intact.  HEENT: Head  atraumatic, normocephalic. Oropharynx and nasopharynx clear.  NECK:  Supple, no jugular venous distention. No thyroid enlargement, no tenderness.  LUNGS: Normal breath sounds bilaterally, no wheezing, rales, rhonchi. No use of accessory muscles of respiration.  CARDIOVASCULAR: S1, S2 normal. No murmurs, rubs, or gallops.  ABDOMEN: Soft, nontender, nondistended. Bowel sounds present. No organomegaly or mass.  EXTREMITIES: No cyanosis, clubbing or edema b/l.   Left sided BKA NEUROLOGIC: Cranial nerves II through XII are intact. No focal Motor or sensory deficits b/l.  Globally weak. PSYCHIATRIC: The patient is alert and oriented x 3.  SKIN: No obvious rash, lesion, or ulcer.  Patient has a purpuric rash on her arms and legs bilaterally suspicious for erythema multiforme.   LABORATORY PANEL:   CBC Recent Labs  Lab 05/21/18 0408  WBC 5.2  HGB 7.5*  HCT 24.8*  PLT 336   ------------------------------------------------------------------------------------------------------------------  Chemistries  Recent Labs  Lab 05/25/2018 0428  05/18/18 0429  05/21/18 0408  NA 136   < > 141   < > 138  K 4.5   < > 4.0   < > 4.5  CL 97*   < > 107   < > 102  CO2 26   < > 27   < > 29  GLUCOSE 190*   < > 56*   < > 206*  BUN 31*   < > 28*   < > 29*  CREATININE 1.02*   < > 0.73   < > 0.86  CALCIUM 8.6*   < > 8.1*   < >  7.7*  MG 1.6*  --  2.2  --   --   AST 16  --   --   --   --   ALT <5  --   --   --   --   ALKPHOS 138*  --   --   --   --   BILITOT 0.5  --   --   --   --    < > = values in this interval not displayed.   ------------------------------------------------------------------------------------------------------------------  Cardiac Enzymes Recent Labs  Lab 05/17/18 1116  TROPONINI 0.76*   ------------------------------------------------------------------------------------------------------------------  RADIOLOGY:  Dg Chest Port 1 View  Result Date: 05/20/2018 CLINICAL DATA:   Acute respiratory failure EXAM: PORTABLE CHEST 1 VIEW COMPARISON:  May 19, 2018 FINDINGS: The heart size and mediastinal contours are stable. Left PICC line is identified unchanged compared prior exam. Increased pulmonary interstitium is identified bilaterally slightly improved. Patchy consolidation is identified in bilateral lung bases unchanged. The visualized skeletal structures are stable. IMPRESSION: Increased pulmonary interstitium in both lungs are slightly improved compared to prior exam. Patchy consolidation of bilateral lung bases are unchanged compared prior exam. Bilateral pleural effusions are probably unchanged. Electronically Signed   By: Abelardo Diesel M.D.   On: 05/20/2018 12:04     ASSESSMENT AND PLAN:   58 year old female with past medical history of COPD, diabetes, peripheral vascular disease, CHF, chronic pain, history of previous CVA previous history of MSSA bacteremia secondary to discitis who presented to the hospital due to shortness of breath.  1.  Acute on chronic respiratory failure with hypoxia- secondary to underlying COPD and also CHF. - Patient used BiPAP at night but continues to be on high flow nasal cannula presently.  Respiratory status is still somewhat tenuous. - Continue intermittent diuresis with IV Lasix, was on IV antibiotics for suspected pneumonia but has finished treatment now.  2.  Acute on chronic diastolic congestive heart failure-source of patient's worsening respiratory failure with hypoxia. -Continue diuresis with Lasix intermittently and patient received a dose yesterday. - volume status stable presently.   3.  History of discitis with MSSA bacteremia- continue vancomycin for now.  Patient was on Ancef but developed a rash and therefore has been switched to vancomycin.  May need to be discharged on oral daptomycin as per ID.  4.  Rash-patient has appropriate rash on her upper and lower extremities suspicious to be secondary to Ancef and possible  erythema multiforme. -As per ID the rash looks a bit worse today.  Continue supportive care and off Ancef for now.  May need dermatology consult on Monday for possible biopsy.  5.  Hx of atrial fibrillation-rate controlled.  Continue amiodarone.  Continue Coumadin.  6.  COPD-no acute exacerbation.  Continue Dulera, Spiriva.  7.  Hypothyroidism-continue Synthroid.  8.  Diabetes type 2 without complication-continue sliding scale insulin.  Blood sugar stable.  9.  Hyperlipidemia-continue atorvastatin.  10.  Anxiety-continue Xanax.   All the records are reviewed and case discussed with Care Management/Social Worker. Management plans discussed with the patient, family and they are in agreement.  CODE STATUS: Full code  DVT Prophylaxis: Coumadin  TOTAL TIME TAKING CARE OF THIS PATIENT: 30 minutes.   POSSIBLE D/C IN unclear, DEPENDING ON CLINICAL CONDITION and progress   Henreitta Leber M.D on 05/21/2018 at 1:11 PM  Between 7am to 6pm - Pager - [phone]  After 6pm go to [email or web address] - [facility]  Office  [phone]  CC: Primary care physician; Erline Levine, MD

## 2018-05-22 LAB — CBC WITH DIFFERENTIAL/PLATELET
Abs Immature Granulocytes: 0.02 10*3/uL (ref 0.00–0.07)
Basophils Absolute: 0 10*3/uL (ref 0.0–0.1)
Basophils Relative: 0 %
Eosinophils Absolute: 0 10*3/uL (ref 0.0–0.5)
Eosinophils Relative: 1 %
HCT: 27.3 % — ABNORMAL LOW (ref 36.0–46.0)
Hemoglobin: 8.1 g/dL — ABNORMAL LOW (ref 12.0–15.0)
Immature Granulocytes: 0 %
Lymphocytes Relative: 12 %
Lymphs Abs: 0.7 10*3/uL (ref 0.7–4.0)
MCH: 26.3 pg (ref 26.0–34.0)
MCHC: 29.7 g/dL — ABNORMAL LOW (ref 30.0–36.0)
MCV: 88.6 fL (ref 80.0–100.0)
Monocytes Absolute: 0.2 10*3/uL (ref 0.1–1.0)
Monocytes Relative: 4 %
Neutro Abs: 4.6 10*3/uL (ref 1.7–7.7)
Neutrophils Relative %: 83 %
Platelets: 353 10*3/uL (ref 150–400)
RBC: 3.08 MIL/uL — ABNORMAL LOW (ref 3.87–5.11)
RDW: 21.2 % — ABNORMAL HIGH (ref 11.5–15.5)
WBC: 5.6 10*3/uL (ref 4.0–10.5)
nRBC: 0 % (ref 0.0–0.2)

## 2018-05-22 LAB — BASIC METABOLIC PANEL
Anion gap: 7 (ref 5–15)
BUN: 38 mg/dL — ABNORMAL HIGH (ref 6–20)
CO2: 28 mmol/L (ref 22–32)
Calcium: 8 mg/dL — ABNORMAL LOW (ref 8.9–10.3)
Chloride: 100 mmol/L (ref 98–111)
Creatinine, Ser: 1.31 mg/dL — ABNORMAL HIGH (ref 0.44–1.00)
GFR calc Af Amer: 52 mL/min — ABNORMAL LOW (ref >60)
GFR calc non Af Amer: 45 mL/min — ABNORMAL LOW (ref >60)
Glucose, Bld: 138 mg/dL — ABNORMAL HIGH (ref 70–99)
Potassium: 4.5 mmol/L (ref 3.5–5.1)
Sodium: 135 mmol/L (ref 135–145)

## 2018-05-22 LAB — ECHOCARDIOGRAM COMPLETE
Height: 63 in
Weight: 2476.21 oz

## 2018-05-22 LAB — GLUCOSE, CAPILLARY
Glucose-Capillary: 136 mg/dL — ABNORMAL HIGH (ref 70–99)
Glucose-Capillary: 195 mg/dL — ABNORMAL HIGH (ref 70–99)
Glucose-Capillary: 184 mg/dL — ABNORMAL HIGH (ref 70–99)
Glucose-Capillary: 131 mg/dL — ABNORMAL HIGH (ref 70–99)

## 2018-05-22 LAB — PROTIME-INR
INR: 1.5 — ABNORMAL HIGH (ref 0.8–1.2)
Prothrombin Time: 17.8 seconds — ABNORMAL HIGH (ref 11.4–15.2)

## 2018-05-22 MED ORDER — PREDNISONE 10 MG PO TABS
40.0000 mg | ORAL_TABLET | Freq: Every day | ORAL | Status: DC
  Administered 2018-05-23 – 2018-05-26 (×3): 40 mg via ORAL
  Filled 2018-05-22 (×3): qty 4

## 2018-05-22 MED ORDER — VANCOMYCIN HCL IN DEXTROSE 1-5 GM/200ML-% IV SOLN
1000.0000 mg | INTRAVENOUS | Status: DC
  Administered 2018-05-22 – 2018-05-23 (×2): 1000 mg via INTRAVENOUS
  Filled 2018-05-22 (×3): qty 200

## 2018-05-22 MED ORDER — WARFARIN SODIUM 7.5 MG PO TABS
7.5000 mg | ORAL_TABLET | Freq: Once | ORAL | Status: AC
  Administered 2018-05-23: 17:00:00 7.5 mg via ORAL
  Filled 2018-05-22: qty 1

## 2018-05-22 MED ORDER — ALBUMIN HUMAN 25 % IV SOLN
12.5000 g | Freq: Once | INTRAVENOUS | Status: AC
  Administered 2018-05-22: 14:00:00 12.5 g via INTRAVENOUS
  Filled 2018-05-22: qty 50

## 2018-05-22 NOTE — Progress Notes (Signed)
CRITICAL CARE NOTE     SUBJECTIVE FINDINGS & SIGNIFICANT EVENTS   Patient clinically improved, is able to communicate verbally.  Reports analgesia is adequately controlling chronic pain.  Continuing intermittent BIPAP for COPD/resp failure MSSA bacteremia - IV vanco CAP - levoquin AEHFpEF - Lasix 80BID Maculopapular rash from cephalosporin which has been stopped.   PAST MEDICAL HISTORY   Past Medical History:  Diagnosis Date  . Asthma   . CHF (congestive heart failure) (HCC)   . Chronic pain   . COPD (chronic obstructive pulmonary disease) (HCC)   . Diabetes mellitus (HCC)   . Peripheral vascular disease (HCC)   . Stroke (cerebrum) [facility])      SURGICAL HISTORY   Past Surgical History:  Procedure Laterality Date  . below the knee LLE amputation Left      FAMILY HISTORY   Family History  Problem Relation Age of Onset  . Heart disease Mother   . Heart disease Father      SOCIAL HISTORY   Social History   Tobacco Use  . Smoking status: Current Every Day Smoker    Packs/day: 2.00    Years: 45.00    Pack years: 90.00    Types: Cigarettes  . Smokeless tobacco: Never Used  Substance Use Topics  . Alcohol use: Not Currently  . Drug use: Never     MEDICATIONS   Current Medication:  Current Facility-Administered Medications:  .  albuterol (PROVENTIL) (2.5 MG/3ML) 0.083% nebulizer solution 2.5 mg, 2.5 mg, Nebulization, Q4H PRN, Seals, Milas Kocher, NP .  ALPRAZolam Prudy Feeler) tablet 0.5 mg, 0.5 mg, Oral, BID PRN, Harlon Ditty D, NP, 0.5 mg at 05/22/18 1052 .  amiodarone (PACERONE) tablet 200 mg, 200 mg, Oral, BID, Seals, Angela H, NP, 200 mg at 05/22/18 0959 .  aspirin EC tablet 81 mg, 81 mg, Oral, Daily, Seals, Pluckemin H, NP, 81 mg at 05/22/18 0957 .  atorvastatin (LIPITOR) tablet 80 mg, 80  mg, Oral, QHS, Seals, Angela H, NP, 80 mg at 05/21/18 2229 .  chlorhexidine (PERIDEX) 0.12 % solution 15 mL, 15 mL, Mouth Rinse, BID, Karna Christmas, Jayleigh Notarianni, MD, 15 mL at 05/22/18 0957 .  clopidogrel (PLAVIX) tablet 75 mg, 75 mg, Oral, Daily, Seals, Angela H, NP, 75 mg at 05/22/18 0957 .  docusate sodium (COLACE) capsule 100 mg, 100 mg, Oral, BID, Sainani, Rolly Pancake, MD, 100 mg at 05/22/18 1000 .  famotidine (PEPCID) tablet 40 mg, 40 mg, Oral, QPM, Seals, Angela H, NP, 40 mg at 05/21/18 1743 .  FLUoxetine (PROZAC) capsule 20 mg, 20 mg, Oral, Daily, Seals, Angela H, NP, 20 mg at 05/22/18 0959 .  fluticasone (FLONASE) 50 MCG/ACT nasal spray 1 spray, 1 spray, Each Nare, Daily, Seals, Angela H, NP, 1 spray at 05/22/18 0955 .  furosemide (LASIX) injection 80 mg, 80 mg, Intravenous, BID, Vida Rigger, MD, 80 mg at 05/22/18 0738 .  guaiFENesin (MUCINEX) 12 hr tablet 600 mg, 600 mg, Oral, BID, Mansy, Jan A, MD, 600 mg at 05/22/18 0958 .  insulin aspart (novoLOG) injection 0-15 Units, 0-15 Units, Subcutaneous, TID WC, Vida Rigger, MD, 3 Units at 05/22/18 1149 .  insulin aspart (novoLOG) injection 0-5 Units, 0-5 Units, Subcutaneous, QHS, Vida Rigger, MD, 2 Units at 05/21/18 2227 .  ipratropium-albuterol (DUONEB) 0.5-2.5 (3) MG/3ML nebulizer solution 3 mL, 3 mL, Nebulization, Q6H, Seals, Angela H, NP, 3 mL at 05/22/18 0758 .  levothyroxine (SYNTHROID) tablet 88 mcg, 88 mcg, Oral, Daily, Seals, [place] H, NP, 88 mcg at 05/22/18  [id number] .  MEDLINE mouth rinse, 15 mL, Mouth Rinse, q12n4p, Jahson Emanuele, MD, 15 mL at 05/21/18 1524 .  mirabegron ER (MYRBETRIQ) tablet 25 mg, 25 mg, Oral, Daily, Seals, Angela H, NP, 25 mg at 05/22/18 0957 .  mometasone-formoterol (DULERA) 200-5 MCG/ACT inhaler 2 puff, 2 puff, Inhalation, BID, Seals, Angela H, NP, 2 puff at 05/22/18 1000 .  montelukast (SINGULAIR) tablet 10 mg, 10 mg, Oral, Daily, Seals, Angela H, NP, 10 mg at 05/22/18 0959 .  morphine 2 MG/ML injection 1-2 mg, 1-2  mg, Intravenous, Q3H PRN, Harlon DittyKeene, Jeremiah D, NP, 1 mg at 05/22/18 1053 .  multivitamin with minerals tablet 1 tablet, 1 tablet, Oral, Daily, Alford HighlandWieting, Richard, MD, 1 tablet at 05/22/18 0957 .  nystatin (MYCOSTATIN) 100000 UNIT/ML suspension 500,000 Units, 5 mL, Oral, QID, Karna ChristmasAleskerov, Kenneth Lax, MD, 500,000 Units at 05/22/18 0958 .  oxyCODONE-acetaminophen (PERCOCET/ROXICET) 5-325 MG per tablet 1 tablet, 1 tablet, Oral, Q8H PRN, 1 tablet at 05/21/18 2241 **AND** oxyCODONE (Oxy IR/ROXICODONE) immediate release tablet 5 mg, 5 mg, Oral, Q8H PRN, Seals, Angela H, NP, 5 mg at 05/22/18 1052 .  polyethylene glycol (MIRALAX / GLYCOLAX) packet 17 g, 17 g, Oral, Daily, [place] SirenSainani, Vivek J, MD, 17 g at 05/22/18 0957 .  protein supplement (ENSURE MAX) liquid, 11 oz, Oral, BID, Alford HighlandWieting, Richard, MD, 11 oz at 05/22/18 0954 .  sodium chloride flush (NS) 0.9 % injection 10-40 mL, 10-40 mL, Intracatheter, Q12H, Mick SellFitzgerald, David P, MD, 10 mL at 05/22/18 1000 .  sodium chloride flush (NS) 0.9 % injection 10-40 mL, 10-40 mL, Intracatheter, PRN, Mick SellFitzgerald, David P, MD .  tiotropium [employer](SPIRIVA) inhalation capsule [facility] use ONLY) 18 mcg, 1 capsule, Inhalation, Daily, Bertram SavinSimpson, Michael L, RPH, 18 mcg at 05/22/18 0954 .  traZODone (DESYREL) tablet 150 mg, 150 mg, Oral, QHS, Seals, Angela H, NP, 150 mg at 05/21/18 2229 .  vancomycin (VANCOCIN) IVPB 1000 mg/200 mL premix, 1,000 mg, Intravenous, Q24H, Loleta RoseForbach, Cory, MD, Last Rate: 200 mL/hr at 05/22/18 1020, 1,000 mg at 05/22/18 1020 .  vitamin C (ASCORBIC ACID) tablet 250 mg, 250 mg, Oral, BID, Renae GlossWieting, Richard, MD, 250 mg at 05/22/18 1018 .  warfarin (COUMADIN) tablet 7.5 mg, 7.5 mg, Oral, ONCE-1800, Wieting, Richard, MD .  Warfarin - Pharmacist Dosing Inpatient, , Does not apply, q1800, Alford HighlandWieting, Richard, MD    ALLERGIES   Skin protectants, misc.; Flagyl [metronidazole]; Gabapentin; Levetiracetam; Tape; Ace inhibitors; Cefazolin; Ertapenem; and Sertraline hcl    REVIEW OF SYSTEMS     10 point ROS conducted and is negative except cough, chronic pain  PHYSICAL EXAMINATION   Vitals:   05/22/18 1000 05/22/18 1100  BP: (!) 109/59 (!) 109/51  Pulse: 94 96  Resp: 16 14  Temp:    SpO2: 96% 91%    GENERAL: Mild distress due to shortness of breath HEAD: Normocephalic, atraumatic.  EYES: Pupils equal, round, reactive to light.  No scleral icterus.  MOUTH: Moist mucosal membrane. NECK: Supple. No thyromegaly. No nodules. No JVD.  PULMONARY: Bilateral rhonchorous breath sounds CARDIOVASCULAR: S1 and S2. Regular rate and rhythm. No murmurs, rubs, or gallops.  GASTROINTESTINAL: Soft, nontender, non-distended. No masses. Positive bowel sounds. No hepatosplenomegaly.  MUSCULOSKELETAL: No swelling, clubbing, or edema.  NEUROLOGIC: Mild distress due to acute illness SKIN:intact,warm,dry   LABS AND IMAGING     -I personally reviewed most recent blood work, imaging and microbiology - significant findings today are acute kidney injury  LAB RESULTS: Recent Labs  Lab 05/20/18 0353 05/20/18 [id number] 05/21/18 0408 05/22/18 0533  NA 138  --  138 135  K 5.7* 4.5 4.5 4.5  CL 105  --  102 100  CO2 27  --  29 28  BUN 23*  --  29* 38*  CREATININE 0.95  --  0.86 1.31*  GLUCOSE 160*  --  206* 138*   Recent Labs  Lab 05/19/18 0412 05/20/18 0353 05/21/18 0408  HGB 7.9* 9.1* 7.5*  HCT 27.0* 30.9* 24.8*  WBC 4.8 4.8 5.2  PLT 361 374 336     IMAGING RESULTS: No results found.    ASSESSMENT AND PLAN     Acuteon chronic hypoxic Respiratory Failure -Multifactorial in etiology  1. Acute decompensated diastolic CHF EF >65% Increasing diuresis todayto 80 bid - BNP 1225- septic with low BP hindering diuresis 2.Bibasilar atelectasis -aggressive bronchopulmonary hygiene, MetaNeb therapy twice daily, chest physiotherapy-discussed with respiratory therapist 3.Possible community-acquired pneumonia-resp cx poor quality (high sq), ID on case, CAP  covered Vanc/Levoquin 4.COPD-intermittently on BIPAP FiO2 40% -Will continue typical COPD care path with nebulizer therapy as well as flutter valve and bronchopulmonary hygiene..  - Prednisone 40 po daily    Maculopapular rash -likely due to recent prolonged IV cefazolin vs plavix, latter being less likely due to chronicity and tempo - ID consultation - appreciate input   -on vancomycin and levoquin - pt had MSSA bacteremia last admit    Acute decompensated diastolic CHF EF >55%- -BNP elevated>1250 -oxygen as needed -Lasixdose increased to 80bid IV - pt takes 80 BID PO at home - adding albumin daily to improve efficacy -follow up cardiac enzymes as indicated ICU monitoring    Moderate protein calorie malnutrition -Albumin 2.4 Dietary consultation    ID- hx of bacteremia and osteomyelitis - microbiology pending  -continue IV abx as prescibed- vanco and levoquin -follow up cultures   GI/Nutrition GI PROPHYLAXIS as indicated DIET-->TF's as tolerated Constipation protocol as indicated   ENDO - ICU hypoglycemic\Hyperglycemia protocol -check FSBS per protocol   ELECTROLYTES -follow labs as needed -replace as needed -pharmacy consultation   DVT/GI PRX ordered -SCDs  TRANSFUSIONS AS NEEDED MONITOR FSBS ASSESS the need for LABS as needed     Critical care provider statement:  Critical care time (minutes):33 Critical care time was exclusive of: Separately billable procedures and treating other patients Critical care was necessary to treat or prevent imminent or life-threatening deterioration of the following conditions:Acute hypoxemic respiratory failure, severe COPD, hyperglycemia, moderate protein calorie malnutrition, multiple comorbid conditions Critical care was time spent personally by me on the following activities: Development of treatment plan with patient or surrogate,  discussions with consultants, evaluation of patient's response to treatment, examination of patient, obtaining history from patient or surrogate, ordering and performing treatments and interventions, ordering and review of laboratory studies and re-evaluation    This document was prepared using Dragon voice recognition software and may include unintentional dictation errors.    Vida Rigger, M.D.  Division of Pulmonary & Critical Care Medicine  [facility]

## 2018-05-22 NOTE — Plan of Care (Signed)
Pt hemodynamically stable overnight. Remained on BiPAP at 50% FiO2 with brief break periods for medication administrations. Patient noticeably more labored with on HFNC vs BiPAP. Unable to spontaneously void overnight. In and out cathed with output. Patient net positive for the last 24 hrs. Problem: Education: Goal: Knowledge of General Education information will improve Description Including pain rating scale, medication(s)/side effects and non-pharmacologic comfort measures Outcome: Progressing   Problem: Health Behavior/Discharge Planning: Goal: Ability to manage health-related needs will improve Outcome: Progressing   Problem: Clinical Measurements: Goal: Ability to maintain clinical measurements within normal limits will improve Outcome: Progressing Goal: Will remain free from infection Outcome: Progressing Goal: Diagnostic test results will improve Outcome: Progressing Goal: Respiratory complications will improve Outcome: Progressing Goal: Cardiovascular complication will be avoided Outcome: Progressing   Problem: Coping: Goal: Level of anxiety will decrease Outcome: Progressing   Problem: Elimination: Goal: Will not experience complications related to bowel motility Outcome: Progressing   Problem: Pain Managment: Goal: General experience of comfort will improve Outcome: Progressing   Problem: Safety: Goal: Ability to remain free from injury will improve Outcome: Progressing   Problem: Skin Integrity: Goal: Risk for impaired skin integrity will decrease Outcome: Progressing Note:  Patient turned q2H and PRN overnight.   Problem: Activity: Goal: Risk for activity intolerance will decrease Outcome: Not Progressing   Problem: Nutrition: Goal: Adequate nutrition will be maintained Outcome: Not Progressing Note:  Patient with poor appetite overnight; Did not drink any of protein supplement ordered for 2200 05/22/2018   Problem: Elimination: Goal:  Will not experience complications related to urinary retention Outcome: Not Progressing Note:  Patient did not spontaneously void overnight. In and out cathed at 0600. 330 mL out.

## 2018-05-22 NOTE — Progress Notes (Signed)
Placed pt on 15L HFNC per RN to allow pt to take meds.

## 2018-05-22 NOTE — Progress Notes (Signed)
PHARMACY CONSULT NOTE - FOLLOW UP  Pharmacy Consult for Electrolyte Monitoring and Replacement   Recent Labs: Potassium (mmol/L)  Date Value  05/22/2018 4.5  01/15/2013 4.2   Magnesium (mg/dL)  Date Value  62/94/7654 2.2  01/14/2013 1.8   Calcium (mg/dL)  Date Value  65/03/5463 8.0 (L)   Calcium, Total (mg/dL)  Date Value  68/12/7515 8.8   Albumin (g/dL)  Date Value  00/17/4944 2.4 (L)  01/14/2013 2.4 (L)   Phosphorus (mg/dL)  Date Value  96/75/9163 3.6   Sodium (mmol/L)  Date Value  05/22/2018 135  01/15/2013 137    Assessment: Patient admitted with respiratory failure/COPD exacerbation.  Goal of Therapy:  Potassium ~4.0, Magnesium ~2.0  Plan:  Electrolytes WNL. No replacement warranted at this time.   Will obtain BMP with am labs.   Pharmacy will continue to monitor and adjust per consult.    Stormy Card [facility] Clinical Pharmacist 05/22/2018 7:45 AM

## 2018-05-22 NOTE — Progress Notes (Signed)
[place] [facility] [place] NAME: Alexandra Goodman    MR#:  [id number]  DATE OF BIRTH:  September 27, 1960  SUBJECTIVE:   Patient continues to have some labored breathing.  Again rested on the BiPAP overnight and was weaned off this morning.  Remains on IV diuresis.  No other acute events overnight.  Still remains constipated.  REVIEW OF SYSTEMS:    Review of Systems  Constitutional: Negative for chills and fever.  HENT: Negative for congestion and tinnitus.   Eyes: Negative for blurred vision and double vision.  Respiratory: Positive for shortness of breath. Negative for cough and wheezing.   Cardiovascular: Negative for chest pain, orthopnea and PND.  Gastrointestinal: Positive for constipation. Negative for abdominal pain, diarrhea, nausea and vomiting.  Genitourinary: Negative for dysuria and hematuria.  Skin: Positive for rash.  Neurological: Positive for weakness (generalized. ). Negative for dizziness, sensory change and focal weakness.  All other systems reviewed and are negative.   Nutrition: Carb control Tolerating Diet: Yes Tolerating PT: Await Eval.   DRUG ALLERGIES:   Allergies  Allergen Reactions  . Skin Protectants, Misc. Rash  . Flagyl [Metronidazole]   . Gabapentin   . Levetiracetam   . Tape Other (See Comments)    blisters  . Ace Inhibitors Rash  . Cefazolin Rash    Rash developed on cefazolin while on long-term treatment (was on 3-4wks)  . Ertapenem Rash  . Sertraline Hcl Rash    VITALS:  Blood pressure (!) 109/51, pulse 96, temperature 97.6 F (36.4 C), temperature source Axillary, resp. rate 14, height 5\' 3"  (1.6 m), weight 72.2 kg, SpO2 91 %.  PHYSICAL EXAMINATION:   Physical Exam  GENERAL:  58 y.o.-year-old patient sitting up in bed in mild Resp. Distress.   EYES: Pupils equal, round, reactive to light and accommodation. No scleral icterus. Extraocular muscles intact.  HEENT: Head atraumatic, normocephalic.  Oropharynx and nasopharynx clear.  NECK:  Supple, no jugular venous distention. No thyroid enlargement, no tenderness.  LUNGS: Normal breath sounds bilaterally, no wheezing, rales, rhonchi. No use of accessory muscles of respiration.  CARDIOVASCULAR: S1, S2 normal. No murmurs, rubs, or gallops.  ABDOMEN: Soft, nontender, nondistended. Bowel sounds present. No organomegaly or mass.  EXTREMITIES: No cyanosis, clubbing or edema b/l.   Left sided BKA NEUROLOGIC: Cranial nerves II through XII are intact. No focal Motor or sensory deficits b/l.  Globally weak. PSYCHIATRIC: The patient is alert and oriented x 3.  SKIN: No obvious rash, lesion, or ulcer.  Patient has a purpuric rash on her arms and legs bilaterally suspicious for erythema multiforme.          LABORATORY PANEL:   CBC Recent Labs  Lab 05/21/18 0408  WBC 5.2  HGB 7.5*  HCT 24.8*  PLT 336   ------------------------------------------------------------------------------------------------------------------  Chemistries  Recent Labs  Lab 05/11/2018 0428  05/18/18 0429  05/22/18 0533  NA 136   < > 141   < > 135  K 4.5   < > 4.0   < > 4.5  CL 97*   < > 107   < > 100  CO2 26   < > 27   < > 28  GLUCOSE 190*   < > 56*   < > 138*  BUN 31*   < > 28*   < > 38*  CREATININE 1.02*   < > 0.73   < > 1.31*  CALCIUM 8.6*   < > 8.1*   < >  8.0*  MG 1.6*  --  2.2  --   --   AST 16  --   --   --   --   ALT <5  --   --   --   --   ALKPHOS 138*  --   --   --   --   BILITOT 0.5  --   --   --   --    < > = values in this interval not displayed.   ------------------------------------------------------------------------------------------------------------------  Cardiac Enzymes Recent Labs  Lab 05/17/18 1116  TROPONINI 0.76*   ------------------------------------------------------------------------------------------------------------------  RADIOLOGY:  No results found.   ASSESSMENT AND PLAN:   58 year old female with past  medical history of COPD, diabetes, peripheral vascular disease, CHF, chronic pain, history of previous CVA previous history of MSSA bacteremia secondary to discitis who presented to the hospital due to shortness of breath.  1.  Acute on chronic respiratory failure with hypoxia- secondary to underlying COPD and also CHF. - Patient continues to use BiPAP at night but continues to be on high flow nasal cannula presently.  Respiratory status is still somewhat tenuous. - cont. Diuresis with IV lasix and off IV abx now.  Wean O2 as tolerated.    2.  Acute on chronic diastolic congestive heart failure-source of patient's worsening respiratory failure with hypoxia. -Continue diuresis with Lasix and follow response and Wean O2 as tolerated  3.  History of discitis with MSSA bacteremia- continue vancomycin for now.  Patient was on Ancef but developed a rash and therefore has been switched to vancomycin.  May need to be discharged on oral daptomycin as per ID.  4.  Rash-patient has purpuric rash on her upper and lower extremities suspicious to be secondary to Ancef and possible erythema multiforme. -As per ID the rash maybe getting worse but no mucosal involvement. -  Continue supportive care and off Ancef for now.  May need dermatology consult on Monday for possible biopsy.  5.  Hx of atrial fibrillation-rate controlled.  Continue amiodarone.  Continue Coumadin.  6.  COPD-no acute exacerbation.  Continue Dulera, Spiriva.  7.  Hypothyroidism-continue Synthroid.  8.  Diabetes type 2 without complication-continue sliding scale insulin.  Blood sugar stable.  9.  Hyperlipidemia-continue atorvastatin.  10.  Anxiety-continue Xanax.   All the records are reviewed and case discussed with Care Management/Social Worker. Management plans discussed with the patient, family and they are in agreement.  CODE STATUS: Full code  DVT Prophylaxis: Coumadin  TOTAL TIME TAKING CARE OF THIS PATIENT: 30 minutes.    POSSIBLE D/C IN unclear, DEPENDING ON CLINICAL CONDITION and progress   Henreitta Leber M.D on 05/22/2018 at 12:11 PM  Between 7am to 6pm - Pager - [phone]  After 6pm go to [email or web address] - Proofreader  [facility]  Office  [phone]  CC: Primary care physician; Erline Levine, MD

## 2018-05-22 NOTE — Consult Note (Signed)
ANTICOAGULATION CONSULT NOTE  Pharmacy Consult for Warfarin Indication: Previous DVT  Allergies  Allergen Reactions  . Skin Protectants, Misc. Rash  . Flagyl [Metronidazole]   . Gabapentin   . Levetiracetam   . Tape Other (See Comments)    blisters  . Ace Inhibitors Rash  . Cefazolin Rash    Rash developed on cefazolin while on long-term treatment (was on 3-4wks)  . Ertapenem Rash  . Sertraline Hcl Rash    Patient Measurements: Height: 5\' 3"  (160 cm) Weight: 159 lb 2.8 oz (72.2 kg) IBW/kg (Calculated) : 52.4   Vital Signs: Temp: 97.9 F (36.6 C) (05/03 0400) Temp Source: Oral (05/03 0400) BP: 102/53 (05/03 0700) Pulse Rate: 74 (05/03 0700)  Labs: Recent Labs    05/20/18 0353 05/21/18 0408 05/22/18 0533  HGB 9.1* 7.5*  --   HCT 30.9* 24.8*  --   PLT 374 336  --   LABPROT 16.7* 16.7* 17.8*  INR 1.4* 1.4* 1.5*  CREATININE 0.95 0.86 1.31*    Estimated Creatinine Clearance: 45.1 mL/min (A) (by C-G formula based on SCr of 1.31 mg/dL (H)).   Medical History: Past Medical History:  Diagnosis Date  . Asthma   . CHF (congestive heart failure) (HCC)   . Chronic pain   . COPD (chronic obstructive pulmonary disease) (HCC)   . Diabetes mellitus (HCC)   . Peripheral vascular disease (HCC)   . 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 Patient takes amiodarone 200 mg PO BID PTA (continued inpatient).  Assessment: 58 y.o. female admitted on 04/30/2018 with COPD exacerbation. Patient was recently discharged with pneumonia and received cefazolin via PICC at home. Patient antibiotic regimen changed to levofloxacin and vancomycin.   Date:     INR:     Dose:  4/27       1.9       8.5 mg 4/28       2.6       Held 4/29       3.9       Hold   4/30       2.4       3 mg 5/1         1.4       7.5 mg 5/2         1.4       7.5 mg 5/3         1.5   Goal of Therapy:  INR 2-3 Monitor  platelets by anticoagulation protocol: Yes   Plan:  Patient appears to be very sensitive to warfarin dose adjustments.  Will order home dose of warfarin 7.5 mg again today 1800. Will obtain INR with am labs.   Pharmacy will continue to monitor  Stormy Card, [facility] Clinical Pharmacist 05/22/2018 7:47 AM

## 2018-05-22 NOTE — Consult Note (Signed)
Pharmacy Antibiotic Note  Alexandra Goodman is a 58 y.o. female admitted on Jun 04, 2018 with COPD exacerbation. Patient was recently discharged with MSSA bacteremia and received cefazolin via PICC at home. Patient now has rash thought to be attributed to cefazolin. ID is consulted.  Pharmacy has been consulted for Vancomycin dosing.  Plan: Currently ordered Vancomycin 1250 mg q24h  Renal function has declined.  Will transition to Vancomycin 1 g IV every 24 hours.  Vancomycin Kinetics Using Scr 1.31, IBW, and Vd 0.72 Goal AUC 400-550 Anticipated AUC 520.9  Will continue to monitor.  Continue Levofloxacin 750 mg IV q24h for PNA.  Height: 5\' 3"  (160 cm) Weight: 159 lb 2.8 oz (72.2 kg) IBW/kg (Calculated) : 52.4  Temp (24hrs), Avg:97.9 F (36.6 C), Min:97.5 F (36.4 C), Max:98.4 F (36.9 C)  Recent Labs  Lab 2018-06-04 0428 2018/06/04 0626 05/17/18 1021 05/18/18 0429 05/19/18 0412 05/20/18 0353 05/21/18 0408 05/22/18 0533  WBC 14.7*  --  8.7 5.3 4.8 4.8 5.2  --   CREATININE 1.02*  --  1.09* 0.73 0.77 0.95 0.86 1.31*  LATICACIDVEN 2.3* 2.1*  --   --   --   --   --   --     Estimated Creatinine Clearance: 45.1 mL/min (A) (by C-G formula based on SCr of 1.31 mg/dL (H)).    Allergies  Allergen Reactions  . Skin Protectants, Misc. Rash  . Flagyl [Metronidazole]   . Gabapentin   . Levetiracetam   . Tape Other (See Comments)    blisters  . Ace Inhibitors Rash  . Cefazolin Rash    Rash developed on cefazolin while on long-term treatment (was on 3-4wks)  . Ertapenem Rash  . Sertraline Hcl Rash    Antimicrobials this admission: 4/27 Azithromycin x 1 4/27 Cefepime x 1 4/27 Vancomycin >> 4/27 Levofloxacin >> 5/2  Dose adjustments this admission: 4/28 Increase Vancomycin from 1250 mg q24 to 1750 mg q36 (starting 4/29) 4/30 Increase Vancomycin to 1500 mg IV q24h from 1750 mg IV q36h starting 5/1 5/1 Decrease Vancomycin from 1500 mg IV q24h to 1250 mg q24h.  5/3 Decrease  Vancomycin from 1250 mg IV q24h to 1000 mg q24h.  Microbiology results: 4/27 BCx: NG x 4 days 4/27 Sputum: no WBC 4/27 MRSA PCR: (-) 4/27 UCx NG 4/27 COVID (-)  Thank you for allowing pharmacy to be a part of this patient's care.  Stormy Card [facility] Clinical Pharmacist 05/22/2018 7:38 AM

## 2018-05-23 DIAGNOSIS — B9561 Methicillin susceptible Staphylococcus aureus infection as the cause of diseases classified elsewhere: Secondary | ICD-10-CM

## 2018-05-23 DIAGNOSIS — Z794 Long term (current) use of insulin: Secondary | ICD-10-CM

## 2018-05-23 DIAGNOSIS — E1151 Type 2 diabetes mellitus with diabetic peripheral angiopathy without gangrene: Secondary | ICD-10-CM

## 2018-05-23 DIAGNOSIS — L97519 Non-pressure chronic ulcer of other part of right foot with unspecified severity: Secondary | ICD-10-CM

## 2018-05-23 DIAGNOSIS — R7881 Bacteremia: Secondary | ICD-10-CM

## 2018-05-23 DIAGNOSIS — M4626 Osteomyelitis of vertebra, lumbar region: Secondary | ICD-10-CM

## 2018-05-23 DIAGNOSIS — D649 Anemia, unspecified: Secondary | ICD-10-CM

## 2018-05-23 DIAGNOSIS — R58 Hemorrhage, not elsewhere classified: Secondary | ICD-10-CM

## 2018-05-23 DIAGNOSIS — J441 Chronic obstructive pulmonary disease with (acute) exacerbation: Secondary | ICD-10-CM

## 2018-05-23 DIAGNOSIS — Z720 Tobacco use: Secondary | ICD-10-CM

## 2018-05-23 DIAGNOSIS — Z89512 Acquired absence of left leg below knee: Secondary | ICD-10-CM

## 2018-05-23 DIAGNOSIS — Z7982 Long term (current) use of aspirin: Secondary | ICD-10-CM

## 2018-05-23 DIAGNOSIS — K6812 Psoas muscle abscess: Secondary | ICD-10-CM

## 2018-05-23 DIAGNOSIS — M4656 Other infective spondylopathies, lumbar region: Secondary | ICD-10-CM

## 2018-05-23 DIAGNOSIS — E1169 Type 2 diabetes mellitus with other specified complication: Secondary | ICD-10-CM

## 2018-05-23 DIAGNOSIS — Z7902 Long term (current) use of antithrombotics/antiplatelets: Secondary | ICD-10-CM

## 2018-05-23 DIAGNOSIS — G061 Intraspinal abscess and granuloma: Secondary | ICD-10-CM

## 2018-05-23 DIAGNOSIS — E11621 Type 2 diabetes mellitus with foot ulcer: Secondary | ICD-10-CM

## 2018-05-23 DIAGNOSIS — I4891 Unspecified atrial fibrillation: Secondary | ICD-10-CM

## 2018-05-23 DIAGNOSIS — Z7901 Long term (current) use of anticoagulants: Secondary | ICD-10-CM

## 2018-05-23 LAB — PROTIME-INR
INR: 2 — ABNORMAL HIGH (ref 0.8–1.2)
Prothrombin Time: 22.2 s — ABNORMAL HIGH (ref 11.4–15.2)

## 2018-05-23 LAB — BASIC METABOLIC PANEL
Anion gap: 10 (ref 5–15)
BUN: 45 mg/dL — ABNORMAL HIGH (ref 6–20)
CO2: 27 mmol/L (ref 22–32)
Calcium: 8 mg/dL — ABNORMAL LOW (ref 8.9–10.3)
Chloride: 98 mmol/L (ref 98–111)
Creatinine, Ser: 1.57 mg/dL — ABNORMAL HIGH (ref 0.44–1.00)
GFR calc Af Amer: 42 mL/min — ABNORMAL LOW (ref >60)
GFR calc non Af Amer: 36 mL/min — ABNORMAL LOW (ref >60)
Glucose, Bld: 188 mg/dL — ABNORMAL HIGH (ref 70–99)
Potassium: 4.5 mmol/L (ref 3.5–5.1)
Sodium: 135 mmol/L (ref 135–145)

## 2018-05-23 LAB — GLUCOSE, CAPILLARY
Glucose-Capillary: 196 mg/dL — ABNORMAL HIGH (ref 70–99)
Glucose-Capillary: 282 mg/dL — ABNORMAL HIGH (ref 70–99)
Glucose-Capillary: 288 mg/dL — ABNORMAL HIGH (ref 70–99)
Glucose-Capillary: 355 mg/dL — ABNORMAL HIGH (ref 70–99)

## 2018-05-23 LAB — MAGNESIUM: Magnesium: 2 mg/dL (ref 1.7–2.4)

## 2018-05-23 LAB — PHOSPHORUS: Phosphorus: 4.7 mg/dL — ABNORMAL HIGH (ref 2.5–4.6)

## 2018-05-23 MED ORDER — INSULIN ASPART 100 UNIT/ML ~~LOC~~ SOLN
5.0000 [IU] | Freq: Once | SUBCUTANEOUS | Status: AC
  Administered 2018-05-23: 22:00:00 5 [IU] via SUBCUTANEOUS

## 2018-05-23 MED ORDER — INSULIN GLARGINE 100 UNIT/ML ~~LOC~~ SOLN
15.0000 [IU] | Freq: Every day | SUBCUTANEOUS | Status: DC
  Administered 2018-05-23 – 2018-05-27 (×5): 15 [IU] via SUBCUTANEOUS
  Filled 2018-05-23 (×6): qty 0.15

## 2018-05-23 MED ORDER — PHENYLEPHRINE HCL 0.25 % NA SOLN
1.0000 | Freq: Four times a day (QID) | NASAL | Status: DC | PRN
  Filled 2018-05-23: qty 15

## 2018-05-23 NOTE — TOC Progression Note (Signed)
Transition of Care [facility]) - Progression Note    Patient Details  Name: Alexandra Goodman MRN: [id number] Date of Birth: 12-12-1960  Transition of Care [facility]) CM/SW Contact  Barrie Dunker, RN Phone Number: 05/23/2018, 12:45 PM  Clinical Narrative:     weaned off bipap last night, plan to monitor today to see if she can remain off bipap today and tonight before possibly moving to the floor tomorrow, she lives at home and is on Chronic O2, still smokes 2 packs per day, palliative consult to be done, remains a full code       Expected Discharge Plan and Services                                                 Social Determinants of Health (SDOH) Interventions    Readmission Risk Interventions Readmission Risk Prevention Plan 04/25/2018  Transportation Screening Complete  PCP or Specialist Appt within 3-5 Days Complete  HRI or Home Care Consult Complete  Social Work Consult for Recovery Care Planning/Counseling Patient refused  Palliative Care Screening Not Applicable  Medication Review [employer]) Complete  Some recent data might be hidden

## 2018-05-23 NOTE — TOC Progression Note (Signed)
Transition of Care [facility]) - Progression Note    Patient Details  Name: Alexandra Goodman MRN: [id number] Date of Birth: [birth year]  Transition of Care [facility]) CM/SW Contact  Barrie Dunker, RN Phone Number: 05/23/2018, 12:47 PM  Clinical Narrative:     weaned off bipap last night, plan to monitor today to see if she can remain off bipap today and tonight before possibly moving to the floor tomorrow, she lives at home and is on Chronic O2, still smokes 2 packs per day, palliative consult to be done, remains a full code       Expected Discharge Plan and Services                                                 Social Determinants of Health (SDOH) Interventions    Readmission Risk Interventions Readmission Risk Prevention Plan 04/25/2018  Transportation Screening Complete  PCP or Specialist Appt within 3-5 Days Complete  HRI or Home Care Consult Complete  Social Work Consult for Recovery Care Planning/Counseling Patient refused  Palliative Care Screening Not Applicable  Medication Review Oceanographer) Complete  Some recent data might be hidden

## 2018-05-23 NOTE — Progress Notes (Signed)
[place] [facility] [place] NAME: Alexandra Goodman    MR#:  [id number]  DATE OF BIRTH:  12/24/1960  SUBJECTIVE:   Remains off Bipap now.  On [place] and O2 sats in the low 90's.  Remains Constipated.   REVIEW OF SYSTEMS:    Review of Systems  Constitutional: Negative for chills and fever.  HENT: Negative for congestion and tinnitus.   Eyes: Negative for blurred vision and double vision.  Respiratory: Positive for shortness of breath. Negative for cough and wheezing.   Cardiovascular: Negative for chest pain, orthopnea and PND.  Gastrointestinal: Positive for constipation. Negative for abdominal pain, diarrhea, nausea and vomiting.  Genitourinary: Negative for dysuria and hematuria.  Skin: Positive for rash.  Neurological: Positive for weakness (generalized. ). Negative for dizziness, sensory change and focal weakness.  All other systems reviewed and are negative.   Nutrition: Carb control Tolerating Diet: Yes Tolerating PT: Await Eval.   DRUG ALLERGIES:   Allergies  Allergen Reactions  . Skin Protectants, Misc. Rash  . Flagyl [Metronidazole]   . Gabapentin   . Levetiracetam   . Tape Other (See Comments)    blisters  . Ace Inhibitors Rash  . Cefazolin Rash    Rash developed on cefazolin while on long-term treatment (was on 3-4wks)  . Ertapenem Rash  . Sertraline Hcl Rash    VITALS:  Blood pressure 102/65, pulse 91, temperature 98.3 F (36.8 C), temperature source Oral, resp. rate (!) 22, height 5\' 3"  (1.6 m), weight 72.2 kg, SpO2 93 %.  PHYSICAL EXAMINATION:   Physical Exam  GENERAL:  58 y.o.-year-old patient sitting up in bed in mild Resp. Distress.   EYES: Pupils equal, round, reactive to light and accommodation. No scleral icterus. Extraocular muscles intact.  HEENT: Head atraumatic, normocephalic. Oropharynx and nasopharynx clear.  NECK:  Supple, no jugular venous distention. No thyroid enlargement, no tenderness.   LUNGS: Normal breath sounds bilaterally, no wheezing, rales, rhonchi. + use of accessory muscles of respiration.  CARDIOVASCULAR: S1, S2 normal. No murmurs, rubs, or gallops.  ABDOMEN: Soft, nontender, nondistended. Bowel sounds present. No organomegaly or mass.  EXTREMITIES: No cyanosis, clubbing or edema b/l.   Left sided BKA NEUROLOGIC: Cranial nerves II through XII are intact. No focal Motor or sensory deficits b/l.  Globally weak. PSYCHIATRIC: The patient is alert and oriented x 3.  SKIN: No obvious rash, lesion, or ulcer.  Patient has a purpuric rash on her arms and legs bilaterally suspicious for erythema multiforme.          LABORATORY PANEL:   CBC Recent Labs  Lab 05/22/18 1230  WBC 5.6  HGB 8.1*  HCT 27.3*  PLT 353   ------------------------------------------------------------------------------------------------------------------  Chemistries  Recent Labs  Lab 05/23/18 0529  NA 135  K 4.5  CL 98  CO2 27  GLUCOSE 188*  BUN 45*  CREATININE 1.57*  CALCIUM 8.0*  MG 2.0   ------------------------------------------------------------------------------------------------------------------  Cardiac Enzymes Recent Labs  Lab 05/17/18 1116  TROPONINI 0.76*   ------------------------------------------------------------------------------------------------------------------  RADIOLOGY:  No results found.   ASSESSMENT AND PLAN:   58 year old female with past medical history of COPD, diabetes, peripheral vascular disease, CHF, chronic pain, history of previous CVA previous history of MSSA bacteremia secondary to discitis who presented to the hospital due to shortness of breath.  1.  Acute on chronic respiratory failure with hypoxia- secondary to underlying COPD and also CHF. -Patient remained off BiPAP overnight and this morning.  Continue high flow nasal cannula.  -Wean oxygen as tolerated.  2.  Acute on chronic diastolic congestive heart failure-source of  patient's worsening respiratory failure with hypoxia. -Improved with diuresis.  Creatinine starting to rise and therefore Lasix discontinued today.  Continue high flow nasal cannula O2 supplementation and wean as tolerated.  3.  History of discitis with MSSA bacteremia- continue vancomycin for now.  Patient was on Ancef but developed a rash and therefore has been switched to vancomycin.  May need to be discharged on oral daptomycin as per ID.  4.  Rash-patient has purpuric rash on her upper and lower extremities suspicious to be secondary to Ancef and possible erythema multiforme. -  Continue supportive care and off Ancef for now. ?? Derm consult but will defer to intensivist for now.   5.  Hx of atrial fibrillation-rate controlled.  Continue amiodarone.  Continue Coumadin.  6.  COPD- mild acute exacerbation.  -   Continue, prednisone, Dulera, Spiriva.  7.  Hypothyroidism-continue Synthroid.  8.  Diabetes type 2 without complication-continue sliding scale insulin.  Blood sugar stable.  9.  Hyperlipidemia-continue atorvastatin.  10.  Anxiety-continue Xanax.  Possible Transfer to floor later today.   All the records are reviewed and case discussed with Care Management/Social Worker. Management plans discussed with the patient, family and they are in agreement.  CODE STATUS: Full code  DVT Prophylaxis: Coumadin  TOTAL TIME TAKING CARE OF THIS PATIENT: 30 minutes.   POSSIBLE D/C IN unclear, DEPENDING ON CLINICAL CONDITION and progress   Henreitta Leber M.D on 05/23/2018 at 1:06 PM  Between 7am to 6pm - Pager - [phone]  After 6pm go to [email or web address] - Proofreader  [facility]  Office  [phone]  CC: Primary care physician; Erline Levine, MD

## 2018-05-23 NOTE — Progress Notes (Signed)
Date of Admission:  05/13/2018     ID: Alexandra Goodman is a 58 y.o. female  Active Problems:   Acute exacerbation of chronic obstructive pulmonary disease (COPD) (HCC)   Pneumonia    Subjective: Says breathing is better  Medications:  . amiodarone  200 mg Oral BID  . aspirin EC  81 mg Oral Daily  . atorvastatin  80 mg Oral QHS  . chlorhexidine  15 mL Mouth Rinse BID  . clopidogrel  75 mg Oral Daily  . docusate sodium  100 mg Oral BID  . famotidine  40 mg Oral QPM  . FLUoxetine  20 mg Oral Daily  . fluticasone  1 spray Each Nare Daily  . guaiFENesin  600 mg Oral BID  . insulin aspart  0-15 Units Subcutaneous TID WC  . insulin aspart  0-5 Units Subcutaneous QHS  . ipratropium-albuterol  3 mL Nebulization Q6H  . levothyroxine  88 mcg Oral Daily  . mouth rinse  15 mL Mouth Rinse q12n4p  . mirabegron ER  25 mg Oral Daily  . mometasone-formoterol  2 puff Inhalation BID  . montelukast  10 mg Oral Daily  . multivitamin with minerals  1 tablet Oral Daily  . nystatin  5 mL Oral QID  . polyethylene glycol  17 g Oral Daily  . predniSONE  40 mg Oral Q breakfast  . Ensure Max Protein  11 oz Oral BID  . sodium chloride flush  10-40 mL Intracatheter Q12H  . tiotropium  1 capsule Inhalation Daily  . traZODone  150 mg Oral QHS  . vitamin C  250 mg Oral BID  . warfarin  7.5 mg Oral ONCE-1800  . Warfarin - Pharmacist Dosing Inpatient   Does not apply q1800    Objective: Vital signs in last 24 hours: Temp:  [97.6 F (36.4 C)-98.3 F (36.8 C)] 98.3 F (36.8 C) (05/04 1200) Pulse Rate:  [74-95] 74 (05/04 1500) Resp:  [12-22] 13 (05/04 1500) BP: (88-114)/(48-78) 88/48 (05/04 1500) SpO2:  [89 %-100 %] 93 % (05/04 1500) FiO2 (%):  [45 %] 45 % (05/03 2019)  PHYSICAL EXAM:  General: Alert, cooperative, pale Head: Normocephalic, without obvious abnormality, atraumatic. Eyes: Conjunctivae clear, anicteric sclerae. Pupils are equal, left eyelid has a purpuric spot ENT Nares normal. No  drainage or sinus tenderness. Lips, mucosa, and tongue normal. No Thrush Neck: Supple, symmetrical, no adenopathy, thyroid: non tender no carotid bruit and no JVD. Back: No CVA tenderness. Lungs: b/l air entry Heart: s1s2 Abdomen: Soft, non-tender,not distended. Bowel sounds normal. No masses Extremities: left BKA Arms and legs have ecchymotic lesions with central hemorrhagic blister       Skin: No rashes or lesions. Or bruising Lymph: Cervical, supraclavicular normal. Neurologic: Grossly non-focal  Lab Results Recent Labs    05/21/18 0408 05/22/18 0533 05/22/18 1230 05/23/18 0529  WBC 5.2  --  5.6  --   HGB 7.5*  --  8.1*  --   HCT 24.8*  --  27.3*  --   NA 138 135  --  135  K 4.5 4.5  --  4.5  CL 102 100  --  98  CO2 29 28  --  27  BUN 29* 38*  --  45*  CREATININE 0.86 1.31*  --  1.57*   Liver Panel No results for input(s): PROT, ALBUMIN, AST, ALT, ALKPHOS, BILITOT, BILIDIR, IBILI in the last 72 hours. Sedimentation Rate No results for input(s): ESRSEDRATE in the last 72 hours.  C-Reactive Protein No results for input(s): CRP in the last 72 hours.  Microbiology:  Studies/Results: No results found.   Assessment/Plan:  COPDexacerbation - improving   Purpuric/ecchymotic lesions extremities/other areas -very unusual for cefazolin allergy - not typical for Erythema multiforme-  r/o warfarin induced purpura /necrosis,   she recently had a high INR and warfarin was stopped and reintroduced - will need Dermatology consult and possible biopsy to look for microthrombi. 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 Paraspinal phlemon and psoas muscle involvement She was on Cefazolin 2 grams IV q8  now on vancoand will be getting it until 06/04/18- may need more  Will have to  follow up with Dr.Cook ( neurosurgeon as OP)  Rt toe ulcer - does not look grossly infected- follow with her podiatrist at [facility]  Anemia-   She is on coumadin, plavix and aspirin- risk for GI bleed-   PAD   Current smoker  H/o Left BKA  Rt posterior achilles ulcer - healed- followed at [facility]   DM- on metformin and insulin  Afib  on amidarone   Discussed the management with patient and pharmacist

## 2018-05-23 NOTE — Progress Notes (Signed)
After further assessment and discussion of patient's  status and medical condition during multidisciplinary rounds, the plan is outlined as below:  1.  We will continue to titrate oxygen as tolerated to maintain oxygen saturations between 88 to 92%.  2.  Continue antibiotics as directed by infectious diseases.  3.  Per recommendation of infectious diseases will attempt to obtain dermatology consult.  4.  Obtain palliative care consult to discuss goals of care.  This patient has advanced chronic obstructive pulmonary disease, FEV1 last known less than 1 L per records from [facility].  She has ongoing tobacco abuse and dependence.  Chronic use of opioids for chronic pain which aggravate her hypercapnia.  Prognosis is exceedingly poor.   Gailen Shelter, M.D.  [facility]

## 2018-05-23 NOTE — Consult Note (Signed)
Pharmacy Antibiotic Note  Alexandra Goodman is a 58 y.o. female admitted on 04/24/2018 with COPD exacerbation. Patient was recently discharged with MSSA bacteremia and received cefazolin via PICC at home. Patient now has rash thought to be attributed to cefazolin. ID is consulted.  Pharmacy has been consulted for Vancomycin dosing.  Plan: Currently ordered Vancomycin 1250 mg q24h. Will obtain peak/trough with next dose.   Height: 5\' 3"  (160 cm) Weight: 159 lb 2.8 oz (72.2 kg) IBW/kg (Calculated) : 52.4  Temp (24hrs), Avg:98 F (36.7 C), Min:97.6 F (36.4 C), Max:98.3 F (36.8 C)  Recent Labs  Lab 05/18/18 0429 05/19/18 0412 05/20/18 0353 05/21/18 0408 05/22/18 0533 05/22/18 1230 05/23/18 0529  WBC 5.3 4.8 4.8 5.2  --  5.6  --   CREATININE 0.73 0.77 0.95 0.86 1.31*  --  1.57*    Estimated Creatinine Clearance: 37.6 mL/min (A) (by C-G formula based on SCr of 1.57 mg/dL (H)).    Allergies  Allergen Reactions  . Skin Protectants, Misc. Rash  . Flagyl [Metronidazole]   . Gabapentin   . Levetiracetam   . Tape Other (See Comments)    blisters  . Ace Inhibitors Rash  . Cefazolin Rash    Rash developed on cefazolin while on long-term treatment (was on 3-4wks)  . Ertapenem Rash  . Sertraline Hcl Rash    Antimicrobials this admission: 4/27 Azithromycin x 1 4/27 Cefepime x 1 4/27 Levofloxacin >> 5/2 4/27 Vancomycin >>  Dose adjustments this admission: 4/28 Increase Vancomycin from 1250 mg q24 to 1750 mg q36 (starting 4/29) 4/30 Increase Vancomycin to 1500 mg IV q24h from 1750 mg IV q36h starting 5/1 5/1 Decrease Vancomycin from 1500 mg IV q24h to 1250 mg q24h.  5/3 Decrease Vancomycin from 1250 mg IV q24h to 1000 mg q24h.  Microbiology results: 4/27 BCx: no growth  4/27 Sputum: few candida albicans  4/27 MRSA PCR: negative  4/27 UCx: no growth  4/27 COVID: negative   Thank you for allowing pharmacy to be a part of this patient's care.  Simpson,Michael  L 05/23/2018 3:48 PM

## 2018-05-23 NOTE — Progress Notes (Signed)
Pharmacy Electrolyte Monitoring Consult:  Pharmacy consulted to assist in monitoring and replacing electrolytes in this 58 y.o. female admitted on 04/26/2018 with Respiratory Distress  Labs:  Sodium (mmol/L)  Date Value  05/23/2018 135  01/15/2013 137   Potassium (mmol/L)  Date Value  05/23/2018 4.5  01/15/2013 4.2   Magnesium (mg/dL)  Date Value  16/10/9602 2.0  01/14/2013 1.8   Phosphorus (mg/dL)  Date Value  54/09/8117 4.7 (H)   Calcium (mg/dL)  Date Value  14/78/2956 8.0 (L)   Calcium, Total (mg/dL)  Date Value  21/30/8657 8.8   Albumin (g/dL)  Date Value  84/69/6295 2.4 (L)  01/14/2013 2.4 (L)    Assessment/Plan: No replacement warranted at this time.   Will obtain electrolytes with am labs.   Will replace for goal potassium ~ 4 and goal magnesium ~ 2.   Pharmacy will continue to monitor and adjust per consult.   Siddhant Hashemi L 05/23/2018 3:33 PM

## 2018-05-23 NOTE — Consult Note (Signed)
ANTICOAGULATION CONSULT NOTE  Pharmacy Consult for Warfarin Indication: Previous DVT  Allergies  Allergen Reactions  . Skin Protectants, Misc. Rash  . Flagyl [Metronidazole]   . Gabapentin   . Levetiracetam   . Tape Other (See Comments)    blisters  . Ace Inhibitors Rash  . Cefazolin Rash    Rash developed on cefazolin while on long-term treatment (was on 3-4wks)  . Ertapenem Rash  . Sertraline Hcl Rash    Patient Measurements: Height: 5\' 3"  (160 cm) Weight: 159 lb 2.8 oz (72.2 kg) IBW/kg (Calculated) : 52.4   Vital Signs: Temp: 98.3 F (36.8 C) (05/04 1200) Temp Source: Oral (05/04 1200) BP: 102/65 (05/04 1200) Pulse Rate: 91 (05/04 1200)  Labs: Recent Labs    05/21/18 0408 05/22/18 0533 05/22/18 1230 05/23/18 0529  HGB 7.5*  --  8.1*  --   HCT 24.8*  --  27.3*  --   PLT 336  --  353  --   LABPROT 16.7* 17.8*  --  22.2*  INR 1.4* 1.5*  --  2.0*  CREATININE 0.86 1.31*  --  1.57*    Estimated Creatinine Clearance: 37.6 mL/min (A) (by C-G formula based on SCr of 1.57 mg/dL (H)).   Medical History: Past Medical History:  Diagnosis Date  . Asthma   . CHF (congestive heart failure) (HCC)   . Chronic pain   . COPD (chronic obstructive pulmonary disease) (HCC)   . Diabetes mellitus (HCC)   . Peripheral vascular disease (HCC)   . 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 Patient takes amiodarone 200 mg PO BID PTA (continued inpatient).  Assessment: 58 y.o. female admitted on 06/15/18 with COPD exacerbation. Patient was recently discharged with pneumonia and received cefazolin via PICC at home. Patient antibiotic regimen changed to vancomycin.   Goal of Therapy:  INR 2-3 Monitor platelets by anticoagulation protocol: Yes   Plan:  Continue warfarin 7.5mg  Q1800. Will continue to obtain daily INRs.   Pharmacy will continue to monitor and adjust per consult.    Fleurette Woolbright L,  05/23/2018 2:53 PM

## 2018-05-23 NOTE — Progress Notes (Signed)
Inpatient Diabetes Program Recommendations  AACE/ADA: New Consensus Statement on Inpatient Glycemic Control (2015)  Target Ranges:  Prepandial:   less than 140 mg/dL      Peak postprandial:   less than 180 mg/dL (1-2 hours)      Critically ill patients:  140 - 180 mg/dL   Lab Results  Component Value Date   GLUCAP 282 (H) 05/23/2018   HGBA1C 9.6 (H) 04/20/2018    Review of Glycemic Control Results for Alexandra Goodman, Alexandra Goodman (MRN [id number]) as of 05/23/2018 12:43  Ref. Range 05/22/2018 07:33 05/22/2018 11:45 05/22/2018 16:30 05/22/2018 19:54 05/23/2018 07:44 05/23/2018 11:30  Glucose-Capillary Latest Ref Range: 70 - 99 mg/dL 497 (H) 026 (H) 378 (H) 131 (H) 196 (H) 282 (H)   Diabetes history: DM 2 Outpatient Diabetes medications: Lantus 46 units QHS, Metformin 1000 mg BID Current orders for Inpatient glycemic control: Novolog 0-15 units TID with meals, Novolog 0-5 units QHS Prednisone 40 mg daily Inpatient Diabetes Program Recommendations:   Please consider adding Lantus 15 units daily (0.2 units/kg). May also need the addition of meal coverage Novolog if post-prandial blood sugars remain>180 mg/dL.   Thanks,  Beryl Meager, RN, BC-ADM Inpatient Diabetes Coordinator Pager [phone] (8a-5p)

## 2018-05-24 DIAGNOSIS — Z515 Encounter for palliative care: Secondary | ICD-10-CM

## 2018-05-24 DIAGNOSIS — J9601 Acute respiratory failure with hypoxia: Secondary | ICD-10-CM

## 2018-05-24 DIAGNOSIS — J9602 Acute respiratory failure with hypercapnia: Secondary | ICD-10-CM

## 2018-05-24 DIAGNOSIS — M310 Hypersensitivity angiitis: Secondary | ICD-10-CM

## 2018-05-24 LAB — BASIC METABOLIC PANEL
Anion gap: 10 (ref 5–15)
Anion gap: 10 (ref 5–15)
BUN: 60 mg/dL — ABNORMAL HIGH (ref 6–20)
BUN: 65 mg/dL — ABNORMAL HIGH (ref 6–20)
CO2: 25 mmol/L (ref 22–32)
CO2: 27 mmol/L (ref 22–32)
Calcium: 8.1 mg/dL — ABNORMAL LOW (ref 8.9–10.3)
Calcium: 8.3 mg/dL — ABNORMAL LOW (ref 8.9–10.3)
Chloride: 96 mmol/L — ABNORMAL LOW (ref 98–111)
Chloride: 95 mmol/L — ABNORMAL LOW (ref 98–111)
Creatinine, Ser: 1.99 mg/dL — ABNORMAL HIGH (ref 0.44–1.00)
Creatinine, Ser: 2.24 mg/dL — ABNORMAL HIGH (ref 0.44–1.00)
GFR calc Af Amer: 32 mL/min — ABNORMAL LOW (ref >60)
GFR calc Af Amer: 27 mL/min — ABNORMAL LOW (ref >60)
GFR calc non Af Amer: 27 mL/min — ABNORMAL LOW (ref >60)
GFR calc non Af Amer: 24 mL/min — ABNORMAL LOW (ref >60)
Glucose, Bld: 274 mg/dL — ABNORMAL HIGH (ref 70–99)
Glucose, Bld: 178 mg/dL — ABNORMAL HIGH (ref 70–99)
Potassium: 5.2 mmol/L — ABNORMAL HIGH (ref 3.5–5.1)
Potassium: 5.3 mmol/L — ABNORMAL HIGH (ref 3.5–5.1)
Sodium: 131 mmol/L — ABNORMAL LOW (ref 135–145)
Sodium: 132 mmol/L — ABNORMAL LOW (ref 135–145)

## 2018-05-24 LAB — GLUCOSE, CAPILLARY
Glucose-Capillary: 230 mg/dL — ABNORMAL HIGH (ref 70–99)
Glucose-Capillary: 203 mg/dL — ABNORMAL HIGH (ref 70–99)
Glucose-Capillary: 163 mg/dL — ABNORMAL HIGH (ref 70–99)
Glucose-Capillary: 126 mg/dL — ABNORMAL HIGH (ref 70–99)

## 2018-05-24 LAB — CBC
HCT: 25.1 % — ABNORMAL LOW (ref 36.0–46.0)
Hemoglobin: 7.5 g/dL — ABNORMAL LOW (ref 12.0–15.0)
MCH: 26.2 pg (ref 26.0–34.0)
MCHC: 29.9 g/dL — ABNORMAL LOW (ref 30.0–36.0)
MCV: 87.8 fL (ref 80.0–100.0)
Platelets: 325 10*3/uL (ref 150–400)
RBC: 2.86 MIL/uL — ABNORMAL LOW (ref 3.87–5.11)
RDW: 20.6 % — ABNORMAL HIGH (ref 11.5–15.5)
WBC: 5.7 10*3/uL (ref 4.0–10.5)
nRBC: 0 % (ref 0.0–0.2)

## 2018-05-24 LAB — VANCOMYCIN, TROUGH: Vancomycin Tr: 30 ug/mL (ref 15–20)

## 2018-05-24 LAB — PROTIME-INR
INR: 3.1 — ABNORMAL HIGH (ref 0.8–1.2)
Prothrombin Time: 31.5 seconds — ABNORMAL HIGH (ref 11.4–15.2)

## 2018-05-24 LAB — POTASSIUM: Potassium: 5.4 mmol/L — ABNORMAL HIGH (ref 3.5–5.1)

## 2018-05-24 MED ORDER — LACTATED RINGERS IV BOLUS
250.0000 mL | Freq: Once | INTRAVENOUS | Status: AC
  Administered 2018-05-24: 16:00:00 250 mL via INTRAVENOUS

## 2018-05-24 MED ORDER — INSULIN ASPART 100 UNIT/ML IV SOLN
10.0000 [IU] | Freq: Once | INTRAVENOUS | Status: AC
  Administered 2018-05-24: 17:00:00 10 [IU] via INTRAVENOUS
  Filled 2018-05-24: qty 0.1

## 2018-05-24 MED ORDER — LORAZEPAM 2 MG/ML IJ SOLN
1.0000 mg | Freq: Three times a day (TID) | INTRAMUSCULAR | Status: DC | PRN
  Administered 2018-05-24 – 2018-05-27 (×4): 1 mg via INTRAVENOUS
  Filled 2018-05-24 (×5): qty 1

## 2018-05-24 MED ORDER — SODIUM POLYSTYRENE SULFONATE 15 GM/60ML PO SUSP
30.0000 g | Freq: Once | ORAL | Status: AC
  Administered 2018-05-24: 21:00:00 30 g via ORAL
  Filled 2018-05-24: qty 120

## 2018-05-24 MED ORDER — INSULIN ASPART 100 UNIT/ML IV SOLN
10.0000 [IU] | Freq: Once | INTRAVENOUS | Status: AC
  Administered 2018-05-24: 06:00:00 10 [IU] via INTRAVENOUS
  Filled 2018-05-24: qty 0.1

## 2018-05-24 MED ORDER — BISACODYL 10 MG RE SUPP
10.0000 mg | Freq: Once | RECTAL | Status: AC
  Administered 2018-05-24: 10 mg via RECTAL
  Filled 2018-05-24: qty 1

## 2018-05-24 MED ORDER — MORPHINE SULFATE (PF) 2 MG/ML IV SOLN
1.0000 mg | Freq: Once | INTRAVENOUS | Status: AC
  Administered 2018-05-24: 1 mg via INTRAVENOUS
  Filled 2018-05-24: qty 1

## 2018-05-24 MED ORDER — LORAZEPAM 2 MG/ML IJ SOLN
1.0000 mg | Freq: Once | INTRAMUSCULAR | Status: AC
  Administered 2018-05-24: 1 mg via INTRAVENOUS
  Filled 2018-05-24: qty 1

## 2018-05-24 MED ORDER — DEXTROSE 50 % IV SOLN
25.0000 mL | Freq: Once | INTRAVENOUS | Status: AC
  Administered 2018-05-24: 25 mL via INTRAVENOUS
  Filled 2018-05-24: qty 50

## 2018-05-24 MED ORDER — DEXTROSE 50 % IV SOLN
25.0000 mL | Freq: Once | INTRAVENOUS | Status: AC
  Administered 2018-05-24: 17:00:00 25 mL via INTRAVENOUS
  Filled 2018-05-24: qty 50

## 2018-05-24 MED ORDER — VANCOMYCIN VARIABLE DOSE PER UNSTABLE RENAL FUNCTION (PHARMACIST DOSING): Status: DC

## 2018-05-24 NOTE — Consult Note (Addendum)
ANTICOAGULATION CONSULT NOTE  Pharmacy Consult for Apixaban  Indication: Previous DVT  Allergies  Allergen Reactions  . Skin Protectants, Misc. Rash  . Flagyl [Metronidazole]   . Gabapentin   . Levetiracetam   . Tape Other (See Comments)    blisters  . Ace Inhibitors Rash  . Cefazolin Rash    Rash developed on cefazolin while on long-term treatment (was on 3-4wks)  . Ertapenem Rash  . Sertraline Hcl Rash    Patient Measurements: Height: 5\' 3"  (160 cm) Weight: 159 lb 2.8 oz (72.2 kg) IBW/kg (Calculated) : 52.4  Vital Signs: Temp: 98.2 F (36.8 C) (05/05 0200) Temp Source: Axillary (05/05 0200) BP: 101/58 (05/05 0500) Pulse Rate: 71 (05/05 0500)  Labs: Recent Labs    05/22/18 0533 05/22/18 1230 05/23/18 0529 05/24/18 0428  HGB  --  8.1*  --  7.5*  HCT  --  27.3*  --  25.1*  PLT  --  353  --  325  LABPROT 17.8*  --  22.2* 31.5*  INR 1.5*  --  2.0* 3.1*  CREATININE 1.31*  --  1.57* 1.99*    Estimated Creatinine Clearance: 29.7 mL/min (A) (by C-G formula based on SCr of 1.99 mg/dL (H)).   Medical History: Past Medical History:  Diagnosis Date  . Asthma   . CHF (congestive heart failure) (HCC)   . Chronic pain   . COPD (chronic obstructive pulmonary disease) (HCC)   . Diabetes mellitus (HCC)   . Peripheral vascular disease (HCC)   . 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 Patient takes amiodarone 200 mg PO BID PTA (continued inpatient).  Assessment: 58 y.o. female admitted on 05/17/2018 with COPD exacerbation. Patient was recently discharged with pneumonia and received cefazolin via PICC at home. Patient antibiotic regimen changed to vancomycin.   Goal of Therapy:  Monitor platelets by anticoagulation protocol: Yes   Plan:  Patient with possible warfarin induced purpura/necrosis. Plan to initiate apixaban when INR < 2.   Pharmacy will continue to monitor and  adjust per consult.   Simpson,Michael L,  05/24/2018 7:50 AM

## 2018-05-24 NOTE — Progress Notes (Addendum)
Patient background: 58 year old current smoker, with known COPD GOLD class IV/D (end-stage) FEV1 less than 1 L in 2017.  Presented on 27 April with acute on chronic respiratory failure.  She has been intermittently BiPAP dependent.  Other issues include lumbar osteomyelitis, diastolic dysfunction, underlying anxiety disorder.  S) patient developed respiratory distress this morning mostly related to anxiety.  Had to be placed back on BiPAP.  Currently less tachypneic and tolerating BiPAP well after Ativan/morphine IV.  The patient is somnolent but arousable.  She states that she feels more comfortable none this morning.  When asked what she would like us to do she state: "I want control for my pain and to sleep".. When asked if her condition worsen if she wanted us to intubate her and put her on mechanical ventilation she emphatically said no.  She states currently her pain and anxiety are well controlled and she likes BiPAP for breathing.  O) Oxygen Supplementation:BiPAP FiO2 (%):  [40 %-60 %] 60 %   Intake/Output: Total I/O In: 36.8 [IV Piggyback:36.8] Out: -    Vitals: Vitals:   05/24/18 1500 05/24/18 1600  BP: (!) 89/57 98/61  Pulse: 82 82  Resp: 19 (!) 22  Temp:    SpO2: (!) 89% 91%     Exam: GENERAL: Mild to moderate tachypnea on BiPAP.  Use of accessories of respirations that appears mostly chronic.  Appears markedly older than stated age. HEENT: Pupils equal round and reactive, sclera anicteric.  Oral mucosa without thrush.  Mucous membranes moist. NECK: Supple.  Trachea midline.No JVD.  PULMONARY:  Distant breath sounds.  Coarse bilaterally , decreased bs at bases.  No rales CARDIOVASCULAR: RRR, grade 2/6 systolic ejection murmur at the left upper sternal border. GASTROINTESTINAL: Soft, nontender, non-distended. No masses.  Normal bowel sounds. No hepatosplenomegaly.  MUSCULOSKELETAL: No cyanosis, clubbing, or edema.  Mild thoracolumbar tenderness. NEUROLOGIC: Lethargic but  easily arousable.  Interacts when awakened. SKIN: Multiple ecchymotic/pupuric areas, some vesicles with crusting.  This is a diffuse finding both on upper and lower extremities.  No purulent vesicle drainage.  Laboratory data reviewed.   ASSESSMENT/PLAN:  1.  Acute on chronic mixed respiratory failure: Multifactorial.  Patient has COPD which is advanced and end-stage.  This is aggravated by diastolic dysfunction with pulmonary edema and hypoventilation due to need for opiates.  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  Prognosis overall is very poor. Continue noninvasive ventilation, pulmonary toilet and bronchodilators.  2.  Diastolic heart failure: Currently compensated.  Diuretics only as needed.  3.  Bibasilar atelectasis due to hypoventilation from multiple issues noted as above.  Main issue is splinting from pain and opiate requirement for pain control aggravated by restrictive physiology from thoracolumbar spine issues.  4. COPD GOLD class IV/D: Exacerbation due to issues noted above, continue BiPAP, pulmonary toilet and bronchodilators.  At present do not believe that she has pneumonia.  Discontinue inhalers as she does not have breath-holding capacity to utilize them.  Use nebulizers instead.  5.  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  Coumadin has been discontinued.  Attempted to get dermatology consult however cannot get dermatology to come to the hospital to evaluate patient.  All other offending medications have been held to include: Famotidine, atorvastatin and Plavix.  Amiodarone could also be a potential culprit however we will continue this for now as other medications are more common.  It would  be ideal to get a skin biopsy.  I will discuss with pathology tomorrow to see if this can be performed with a punch biopsy at the  bedside.  She may require colchicine and/or dapsone for treatment.  6.  Lumbar osteomyelitis: Continue antibiotics as determined by ID.  7.  History of DVT/PAF: Has been on anticoagulants however will withhold due to issues noted above.  8. DMM II: Continue sliding scale  9. EOL Discussion: Patient initially was leaning towards palliative care however she now desires full CODE STATUS after discussion with palliative care.  Palliative care will continue to engage the patient in end-of-life discussion.  Appreciate their input.  10.  Tobacco abuse and dependence due to cigarettes: This issue adds complexity to her management.   Multidisciplinary rounds were performed with ICU team.  I discussed the case with palliative care.  I also discussed the care with Dr. Cherlynn Kaiser.  The patient has multiple end-stage comorbidities and overall her prognosis is very poor.

## 2018-05-24 NOTE — Consult Note (Addendum)
Consultation Note Date: 05/24/2018   Patient Name: Alexandra Goodman  DOB: Oct 14, 1960  MRN: [id number]  Age / Sex: 58 y.o., female  PCP: Erline Levine, MD Referring Physician: Henreitta Leber, MD  Reason for Consultation: Establishing goals of care  HPI/Patient Profile: Alexandra Goodman  is a 58 y.o. female with a known history of COPD, CHF, peripheral vascular disease, and a recent specialization, 3 weeks ago, for treatment of pneumonia.  She was discharged home on IV cefazolin to left PICC line.  She now returns to the emergency room complaining of acute increase in shortness of breath over the last day with chest tightness despite use of home oxygen therapy at 4 L/min.   Clinical Assessment and Goals of Care: Patient is resting in bed on BIPAP. We discussed her alternating need for high flow cannula and BIPAP. She states she would feel better if she could get out of bed and sit in a chair. She states she has been sleeping well, and feels like she is getting better when she is sleeping. She states the high flow cannula works for her when her "nose is cleaned out."   She has 2 daughters Turkey and Finley. She has a boyfriend with whom she lives.   She does not use any assistive devices such as can or walker or wheelchair. She denies issue with her appetite. She uses 4 lpm of O2 at home. Her activity is restricted by the length of her concentrator tubing, and her back pain. She has back pain from a previous injury and from her recent bacteremia affecting her spine/spinal canal. She uses neb tx every 3-4 hours at home.    Began discussion of [place]. She does state she would want a ventilator for "a short time" of about a week. She wants all other interventions. With code status she states "you might as well try." She then stated she needs something for anxiety.     SUMMARY OF RECOMMENDATIONS   Continue current care.   Full scope of treatment at this time. Will continue to follow.    Code Status/Advance Care Planning:  Full code   Prognosis:   Poor overall.   Discharge Planning: To Be Determined      Primary Diagnoses: Present on Admission: . Acute exacerbation of chronic obstructive pulmonary disease (COPD) ([place]) . Pneumonia   I have reviewed the medical record, interviewed the patient and family, and examined the patient. The following aspects are pertinent.  Past Medical History:  Diagnosis Date  . Asthma   . CHF (congestive heart failure) ([place])   . Chronic pain   . COPD (chronic obstructive pulmonary disease) ([place])   . Diabetes mellitus ([place])   . Peripheral vascular disease ([place])   . Stroke (cerebrum) [facility])    Social History   Socioeconomic History  . Marital status: Single    Spouse name: Not on file  . Number of children: Not on file  . Years of education: Not on file  . Highest education level: Not on  file  Occupational History  . Not on file  Social Needs  . Financial resource strain: Not on file  . Food insecurity:    Worry: Not on file    Inability: Not on file  . Transportation needs:    Medical: Not on file    Non-medical: Not on file  Tobacco Use  . Smoking status: Current Every Day Smoker    Packs/day: 2.00    Years: 45.00    Pack years: 90.00    Types: Cigarettes  . Smokeless tobacco: Never Used  Substance and Sexual Activity  . Alcohol use: Not Currently  . Drug use: Never  . Sexual activity: Not on file  Lifestyle  . Physical activity:    Days per week: Not on file    Minutes per session: Not on file  . Stress: Not on file  Relationships  . Social connections:    Talks on phone: Not on file    Gets together: Not on file    Attends religious service: Not on file    Active member of club or organization: Not on file    Attends meetings of clubs or organizations: Not on file    Relationship status: Not on file  Other Topics Concern  . Not on  file  Social History Narrative  . Not on file   Family History  Problem Relation Age of Onset  . Heart disease Mother   . Heart disease Father    Scheduled Meds: . amiodarone  200 mg Oral BID  . aspirin EC  81 mg Oral Daily  . atorvastatin  80 mg Oral QHS  . chlorhexidine  15 mL Mouth Rinse BID  . clopidogrel  75 mg Oral Daily  . docusate sodium  100 mg Oral BID  . famotidine  40 mg Oral QPM  . FLUoxetine  20 mg Oral Daily  . fluticasone  1 spray Each Nare Daily  . guaiFENesin  600 mg Oral BID  . insulin aspart  0-15 Units Subcutaneous TID WC  . insulin aspart  0-5 Units Subcutaneous QHS  . insulin glargine  15 Units Subcutaneous Daily  . ipratropium-albuterol  3 mL Nebulization Q6H  . levothyroxine  88 mcg Oral Daily  . mouth rinse  15 mL Mouth Rinse q12n4p  . mirabegron ER  25 mg Oral Daily  . montelukast  10 mg Oral Daily  . multivitamin with minerals  1 tablet Oral Daily  . nystatin  5 mL Oral QID  . polyethylene glycol  17 g Oral Daily  . predniSONE  40 mg Oral Q breakfast  . Ensure Max Protein  11 oz Oral BID  . sodium chloride flush  10-40 mL Intracatheter Q12H  . traZODone  150 mg Oral QHS  . vitamin C  250 mg Oral BID   Continuous Infusions: PRN Meds:.albuterol, LORazepam, morphine injection, oxyCODONE-acetaminophen **AND** oxyCODONE, phenylephrine, sodium chloride flush Medications Prior to Admission:  Prior to Admission medications   Medication Sig Start Date End Date Taking? Authorizing Provider  albuterol (PROAIR HFA) 108 (90 Base) MCG/ACT inhaler Inhale 2 puffs into the lungs every 4 (four) hours as needed for wheezing. 04/04/18  Yes [provider]  amiodarone (PACERONE) 200 MG tablet Take 1 tablet (200 mg total) by mouth 2 (two) times daily for 30 days. 04/27/18 2018-06-10 Yes Pyreddy, Reatha Harps, MD  aspirin EC 81 MG EC tablet Take 1 tablet (81 mg total) by mouth daily for 30 days. 04/28/18 05/28/18 Yes Saundra Shelling, MD  atorvastatin (LIPITOR) 80 MG tablet  Take 80 mg by mouth Nightly. 12/06/17 12/06/18 Yes [provider]  ceFAZolin (ANCEF) IVPB Inject 2 g into the vein every 8 (eight) hours. Indication:  MSSA bacteremia and lumbar discitis Last Day of Therapy:  06/04/2018 Labs - Once weekly (on Mon):  CBC/D and CMP, Labs - Every other week (on Mon):  ESR and CRP 04/27/18 06/04/18 Yes Pyreddy, Reatha Harps, MD  clopidogrel (PLAVIX) 75 MG tablet Take 75 mg by mouth daily. 02/22/18  Yes [provider]  famotidine (PEPCID) 40 MG tablet Take 40 mg by mouth every evening. 11/03/17 11/03/18 Yes [provider]  FLUoxetine (PROZAC) 20 MG capsule Take 20 mg by mouth daily. 02/02/18  Yes [provider]  fluticasone (FLONASE) 50 MCG/ACT nasal spray Place 1 spray into both nostrils daily. 07/30/15  Yes [provider]  fluticasone-salmeterol (ADVAIR HFA) 230-21 MCG/ACT inhaler Inhale 2 puffs into the lungs 2 (two) times daily. 05/10/16  Yes [provider]  furosemide (LASIX) 40 MG tablet Take 40-80 mg by mouth 2 (two) times daily. 80 mg every morning and 40 mg every evening 11/25/17  Yes [provider]  Insulin Glargine (LANTUS SOLOSTAR) 100 UNIT/ML Solostar Pen Inject 46 Units into the skin at bedtime. 02/21/18  Yes [provider]  levothyroxine (SYNTHROID, LEVOTHROID) 88 MCG tablet Take 88 mcg by mouth daily. 02/16/18  Yes [provider]  losartan (COZAAR) 100 MG tablet Take 100 mg by mouth daily. 01/10/18  Yes [provider]  metFORMIN (GLUCOPHAGE) 1000 MG tablet Take 1,000 mg by mouth 2 (two) times daily. 01/13/18  Yes [provider]  mirabegron ER (MYRBETRIQ) 25 MG TB24 tablet Take 25 mg by mouth daily. 11/03/17  Yes [provider]  montelukast (SINGULAIR) 10 MG tablet Take 10 mg by mouth daily. 04/05/17  Yes [provider]  naloxone (NARCAN) nasal spray 4 mg/0.1 mL Place 4 mg into the nose once. 04/23/16  Yes [provider]  nitroGLYCERIN  (NITROSTAT) 0.4 MG SL tablet Place 0.4 mg under the tongue as needed. 11/08/17  Yes [provider]  oxyCODONE-acetaminophen (PERCOCET) 10-325 MG tablet Take 1 tablet by mouth every 6 (six) hours as needed for pain. 04/27/18  Yes Pyreddy, Reatha Harps, MD  senna-docusate (SENOKOT-S) 8.6-50 MG tablet Take 2 tablets by mouth daily. 06/08/17 06/08/18 Yes [provider]  tiotropium (SPIRIVA HANDIHALER) 18 MCG inhalation capsule Place 1 capsule into inhaler and inhale daily. 10/29/16  Yes [provider]  traZODone (DESYREL) 50 MG tablet Take 150 mg by mouth at bedtime. 08/30/17  Yes [provider]  warfarin (COUMADIN) 5 MG tablet Take 7.5 mg by mouth daily. 02/02/18  Yes [provider]   Allergies  Allergen Reactions  . Skin Protectants, Misc. Rash  . Flagyl [Metronidazole]   . Gabapentin   . Levetiracetam   . Tape Other (See Comments)    blisters  . Ace Inhibitors Rash  . Cefazolin Rash    Rash developed on cefazolin while on long-term treatment (was on 3-4wks)  . Ertapenem Rash  . Sertraline Hcl Rash   Review of Systems  Constitutional: Positive for activity change.  Respiratory: Positive for shortness of breath.     Physical Exam Pulmonary:     Comments: On BIPAP Neurological:     Mental Status: She is alert.     Vital Signs: BP 94/61   Pulse 79   Temp (!) 96.6 F (35.9 C) (Axillary)   Resp 17  Ht '5\' 3"'$  (1.6 m)   Wt 72.2 kg   SpO2 96%   BMI 28.20 kg/m  Pain Scale: 0-10 POSS *See Group Information*: 2-Acceptable,Slightly drowsy, easily aroused Pain Score: Asleep   SpO2: SpO2: 96 % O2 Device:SpO2: 96 % O2 Flow Rate: .O2 Flow Rate (L/min): 10 L/min  IO: Intake/output summary:   Intake/Output Summary (Last 24 hours) at 05/24/2018 1320 Last data filed at 05/24/2018 0000 Gross per 24 hour  Intake 790 ml  Output 875 ml  Net -85 ml    LBM: Last BM Date: 05/18/18 Baseline Weight: Weight: 66 kg Most recent weight: Weight: 72.2 kg      Palliative Assessment/Data:   Flowsheet Rows     Most Recent Value  Intake Tab  Referral Department  Critical care  Unit at Time of Referral  ICU  Palliative Care Primary Diagnosis  Pulmonary  Date Notified  05/23/18  Palliative Care Type  New Palliative care  Reason for referral  Clarify Goals of Care  Date of Admission  05/08/2018  # of days IP prior to Palliative referral  7  Clinical Assessment  Psychosocial & Spiritual Assessment  Palliative Care Outcomes      Time In: 12:50 Time Out: 1:40 Time Total: 50 min Greater than 50%  of this time was spent counseling and coordinating care related to the above assessment and plan.  Signed by: Asencion Gowda, NP   Please contact Palliative Medicine Team phone at [phone] for questions and concerns.  For individual provider: See Shea Evans

## 2018-05-24 NOTE — Progress Notes (Signed)
[employer] - [place] at [facility]     PATIENT NAME: Alexandra Goodman    MR#:  [id number]  DATE OF BIRTH:  March 24, 1960  SUBJECTIVE:   Patient presently on BiPAP and slept on it.  Still has some respiratory distress.  Still complaining of constipation.  REVIEW OF SYSTEMS:    Review of Systems  Constitutional: Negative for chills and fever.  HENT: Negative for congestion and tinnitus.   Eyes: Negative for blurred vision and double vision.  Respiratory: Positive for shortness of breath. Negative for cough and wheezing.   Cardiovascular: Negative for chest pain, orthopnea and PND.  Gastrointestinal: Positive for constipation. Negative for abdominal pain, diarrhea, nausea and vomiting.  Genitourinary: Negative for dysuria and hematuria.  Skin: Positive for rash.  Neurological: Positive for weakness (generalized. ). Negative for dizziness, sensory change and focal weakness.  All other systems reviewed and are negative.   Nutrition: Carb control Tolerating Diet: Yes Tolerating PT: Await Eval.   DRUG ALLERGIES:   Allergies  Allergen Reactions  . Skin Protectants, Misc. Rash  . Flagyl [Metronidazole]   . Gabapentin   . Levetiracetam   . Tape Other (See Comments)    blisters  . Ace Inhibitors Rash  . Cefazolin Rash    Rash developed on cefazolin while on long-term treatment (was on 3-4wks)  . Ertapenem Rash  . Sertraline Hcl Rash    VITALS:  Blood pressure 94/61, pulse 79, temperature (!) 96.6 F (35.9 C), temperature source Axillary, resp. rate 17, height 5\' 3"  (1.6 m), weight 72.2 kg, SpO2 96 %.  PHYSICAL EXAMINATION:   Physical Exam  GENERAL:  58 y.o.-year-old patient sitting up in bed in mild Resp. Distress on Bipap EYES: Pupils equal, round, reactive to light and accommodation. No scleral icterus. Extraocular muscles intact.  HEENT: Head atraumatic, normocephalic. Oropharynx and nasopharynx clear.  NECK:  Supple, no jugular venous distention. No  thyroid enlargement, no tenderness.  LUNGS: Normal breath sounds bilaterally, no wheezing, rales, rhonchi. + use of accessory muscles of respiration.  CARDIOVASCULAR: S1, S2 normal. No murmurs, rubs, or gallops.  ABDOMEN: Soft, nontender, nondistended. Bowel sounds present. No organomegaly or mass.  EXTREMITIES: No cyanosis, clubbing or edema b/l.   Left sided BKA NEUROLOGIC: Cranial nerves II through XII are intact. No focal Motor or sensory deficits b/l.  Globally weak. PSYCHIATRIC: The patient is alert and oriented x 3.  SKIN: No obvious rash, lesion, or ulcer.  Patient has a purpuric rash on her arms and legs as seen below          LABORATORY PANEL:   CBC Recent Labs  Lab 05/24/18 0428  WBC 5.7  HGB 7.5*  HCT 25.1*  PLT 325   ------------------------------------------------------------------------------------------------------------------  Chemistries  Recent Labs  Lab 05/23/18 0529 05/24/18 0428  NA 135 131*  K 4.5 5.2*  CL 98 96*  CO2 27 25  GLUCOSE 188* 274*  BUN 45* 60*  CREATININE 1.57* 1.99*  CALCIUM 8.0* 8.1*  MG 2.0  --    ------------------------------------------------------------------------------------------------------------------  Cardiac Enzymes No results for input(s): TROPONINI in the last 168 hours. ------------------------------------------------------------------------------------------------------------------  RADIOLOGY:  No results found.   ASSESSMENT AND PLAN:   58 year old female with past medical history of COPD, diabetes, peripheral vascular disease, CHF, chronic pain, history of previous CVA previous history of MSSA bacteremia secondary to discitis who presented to the hospital due to shortness of breath.  1.  Acute on chronic respiratory failure with hypoxia- secondary to underlying COPD and also  CHF. -Off diuresis his creatinine was trending up.  Continue BiPAP support and further COPD treatment as mentioned below.  2.   Acute on chronic diastolic congestive heart failure-source of patient's worsening respiratory failure with hypoxia. -Improved with diuresis.  Creatinine was starting to rise and therefore Lasix discontinued yesterday.   - cont. Bipap support and O2 as tolerated.   3.  History of discitis with MSSA bacteremia- continue vancomycin for now.  Patient was on Ancef but developed a rash and therefore has been switched to vancomycin.   - appreciate ID input and cont. Vanc until 06/04/18 and outpatient follow with Neurosurgery.   4.  Rash-patient has purpuric rash on her upper and lower extremities -Initially thought to be secondary to Ancef but after seen by ID they think this could be a warfarin induced purpura.  They recommended dermatology consult and pulmonologist/intensivist called but no dermatologist presently available to evaluate the patient.  Continue conservative care for now. -Coumadin has been stopped presently.  She is also off Ancef  5.  Hx of atrial fibrillation-rate controlled.  Continue amiodarone.   - hold coumadin due to above.   6.  COPD- mild acute exacerbation.  -   Continue, prednisone, Dulera, Spiriva.  7.  Hypothyroidism-continue Synthroid.  8.  Diabetes type 2 without complication- continue sliding scale insulin.  Blood sugar stable.  9.  Hyperlipidemia-continue atorvastatin.  10.  Anxiety-continue Xanax.  His prognosis is poor given her end-stage COPD and multiple comorbidities.  Discussed with intensivist and plan for palliative care consult to discuss goals of care.  All the records are reviewed and case discussed with Care Management/Social Worker. Management plans discussed with the patient, family and they are in agreement.  CODE STATUS: Full code  DVT Prophylaxis: Coumadin  TOTAL TIME TAKING CARE OF THIS PATIENT: 30 minutes.   POSSIBLE D/C IN unclear, DEPENDING ON CLINICAL CONDITION and progress   Houston SirenVivek J Couper Juncaj M.D on 05/24/2018 at 1:23 PM  Between  7am to 6pm - Pager - [phone]  After 6pm go to [email or web address] - Social research officer, governmentpassword EPAS [facility]  [facility] [place] [facility]  Office  [phone]  CC: Primary care physician; Floreen ComberKimel-Scott, Karen, MD

## 2018-05-24 NOTE — Consult Note (Signed)
Pharmacy Antibiotic Note  Alexandra Goodman is a 58 y.o. female admitted on 05-22-18 with COPD exacerbation. Patient was recently discharged with MSSA bacteremia and received cefazolin via PICC at home. Patient now has rash thought to be attributed to cefazolin. ID is consulted.  Pharmacy has been consulted for Vancomycin dosing.  Plan: Vancomycin trough 30. Renal function continues to decline. Will hold dose until vancomycin level < 15. Will obtain vancomycin random level with am labs.   Height: 5\' 3"  (160 cm) Weight: 159 lb 2.8 oz (72.2 kg) IBW/kg (Calculated) : 52.4  Temp (24hrs), Avg:97.5 F (36.4 C), Min:96.6 F (35.9 C), Max:98.2 F (36.8 C)  Recent Labs  Lab 05/19/18 0412 05/20/18 0353 05/21/18 0408 05/22/18 0533 05/22/18 1230 05/23/18 0529 05/24/18 0428 05/24/18 1116 05/24/18 1440  WBC 4.8 4.8 5.2  --  5.6  --  5.7  --   --   CREATININE 0.77 0.95 0.86 1.31*  --  1.57* 1.99*  --  2.24*  VANCOTROUGH  --   --   --   --   --   --   --  30*  --     Estimated Creatinine Clearance: 26.4 mL/min (A) (by C-G formula based on SCr of 2.24 mg/dL (H)).    Allergies  Allergen Reactions  . Skin Protectants, Misc. Rash  . Flagyl [Metronidazole]   . Gabapentin   . Levetiracetam   . Tape Other (See Comments)    blisters  . Ace Inhibitors Rash  . Cefazolin Rash    Rash developed on cefazolin while on long-term treatment (was on 3-4wks)  . Ertapenem Rash  . Sertraline Hcl Rash    Antimicrobials this admission: 4/27 Azithromycin x 1 4/27 Cefepime x 1 4/27 Levofloxacin >> 5/2 4/27 Vancomycin >>  Dose adjustments this admission: 4/28 Increase Vancomycin from 1250 mg q24 to 1750 mg q36 (starting 4/29) 4/30 Increase Vancomycin to 1500 mg IV q24h from 1750 mg IV q36h starting 5/1 5/1 Decrease Vancomycin from 1500 mg IV q24h to 1250 mg q24h.  5/3 Decrease Vancomycin from 1250 mg IV q24h to 1000 mg q24h.  Microbiology results: 4/27 BCx: no growth  4/27 Sputum: few candida  albicans  4/27 MRSA PCR: negative  4/27 UCx: no growth  4/27 COVID: negative   Thank you for allowing pharmacy to be a part of this patient's care.  Simpson,Michael L 05/24/2018 3:40 PM

## 2018-05-24 NOTE — Progress Notes (Addendum)
Pharmacy Electrolyte Monitoring Consult:  Pharmacy consulted to assist in monitoring and replacing electrolytes in this 58 y.o. female admitted on 2018/06/13 with Respiratory Distress  Labs:  Sodium (mmol/L)  Date Value  05/24/2018 131 (L)  01/15/2013 137   Potassium (mmol/L)  Date Value  05/24/2018 5.2 (H)  01/15/2013 4.2   Magnesium (mg/dL)  Date Value  76/80/8811 2.0  01/14/2013 1.8   Phosphorus (mg/dL)  Date Value  04/02/9456 4.7 (H)   Calcium (mg/dL)  Date Value  59/29/2446 8.1 (L)   Calcium, Total (mg/dL)  Date Value  28/63/8177 8.8   Albumin (g/dL)  Date Value  11/65/7903 2.4 (L)  01/14/2013 2.4 (L)    Assessment/Plan: Patient's serum creatinine continues to rise. No replacement warranted at this time.   Will check BMP at 1300 and again with am labs.  Will replace for goal potassium ~ 4 and goal magnesium ~ 2.   Pharmacy will continue to monitor and adjust per consult.   Brianda Beitler L 05/24/2018 7:44 AM

## 2018-05-24 NOTE — Progress Notes (Signed)
ID purpuric and ecchymotic lesions Warfarin induced purpura VS cefazolin drug allergy Spoke to [facility] Dermatologist. One of his team will see her tomorrow

## 2018-05-24 NOTE — Progress Notes (Signed)
Pt requested to be removed from Bi-PAP.  Pt dropped to the 70s before could apply HFNC.  Bi-PAP was immediatly replaced. It took her approximately 5 minutes to recover sats to 89-90%.

## 2018-05-25 DIAGNOSIS — M0008 Staphylococcal arthritis, vertebrae: Secondary | ICD-10-CM

## 2018-05-25 DIAGNOSIS — N179 Acute kidney failure, unspecified: Secondary | ICD-10-CM

## 2018-05-25 DIAGNOSIS — D692 Other nonthrombocytopenic purpura: Secondary | ICD-10-CM

## 2018-05-25 LAB — CBC WITH DIFFERENTIAL/PLATELET
Abs Immature Granulocytes: 0.01 10*3/uL (ref 0.00–0.07)
Basophils Absolute: 0 10*3/uL (ref 0.0–0.1)
Basophils Relative: 0 %
Eosinophils Absolute: 0 10*3/uL (ref 0.0–0.5)
Eosinophils Relative: 1 %
HCT: 27.7 % — ABNORMAL LOW (ref 36.0–46.0)
Hemoglobin: 8.2 g/dL — ABNORMAL LOW (ref 12.0–15.0)
Immature Granulocytes: 0 %
Lymphocytes Relative: 20 %
Lymphs Abs: 1 10*3/uL (ref 0.7–4.0)
MCH: 25.9 pg — ABNORMAL LOW (ref 26.0–34.0)
MCHC: 29.6 g/dL — ABNORMAL LOW (ref 30.0–36.0)
MCV: 87.7 fL (ref 80.0–100.0)
Monocytes Absolute: 0.3 10*3/uL (ref 0.1–1.0)
Monocytes Relative: 6 %
Neutro Abs: 3.7 10*3/uL (ref 1.7–7.7)
Neutrophils Relative %: 73 %
Platelets: 369 10*3/uL (ref 150–400)
RBC: 3.16 MIL/uL — ABNORMAL LOW (ref 3.87–5.11)
RDW: 20.3 % — ABNORMAL HIGH (ref 11.5–15.5)
WBC: 5.1 10*3/uL (ref 4.0–10.5)
nRBC: 0 % (ref 0.0–0.2)

## 2018-05-25 LAB — GLUCOSE, CAPILLARY
Glucose-Capillary: 143 mg/dL — ABNORMAL HIGH (ref 70–99)
Glucose-Capillary: 138 mg/dL — ABNORMAL HIGH (ref 70–99)
Glucose-Capillary: 331 mg/dL — ABNORMAL HIGH (ref 70–99)
Glucose-Capillary: 223 mg/dL — ABNORMAL HIGH (ref 70–99)

## 2018-05-25 LAB — BASIC METABOLIC PANEL
Anion gap: 8 (ref 5–15)
BUN: 71 mg/dL — ABNORMAL HIGH (ref 6–20)
CO2: 27 mmol/L (ref 22–32)
Calcium: 8 mg/dL — ABNORMAL LOW (ref 8.9–10.3)
Chloride: 98 mmol/L (ref 98–111)
Creatinine, Ser: 2.23 mg/dL — ABNORMAL HIGH (ref 0.44–1.00)
GFR calc Af Amer: 27 mL/min — ABNORMAL LOW (ref >60)
GFR calc non Af Amer: 24 mL/min — ABNORMAL LOW (ref >60)
Glucose, Bld: 128 mg/dL — ABNORMAL HIGH (ref 70–99)
Potassium: 5.4 mmol/L — ABNORMAL HIGH (ref 3.5–5.1)
Sodium: 133 mmol/L — ABNORMAL LOW (ref 135–145)

## 2018-05-25 LAB — VANCOMYCIN, RANDOM: Vancomycin Rm: 24

## 2018-05-25 LAB — PROTIME-INR
INR: 3.8 — ABNORMAL HIGH (ref 0.8–1.2)
Prothrombin Time: 36.7 seconds — ABNORMAL HIGH (ref 11.4–15.2)

## 2018-05-25 MED ORDER — SODIUM ZIRCONIUM CYCLOSILICATE 5 G PO PACK
10.0000 g | PACK | Freq: Every day | ORAL | Status: DC
  Administered 2018-05-25 – 2018-05-26 (×2): 10 g via ORAL
  Filled 2018-05-25 (×3): qty 2

## 2018-05-25 NOTE — Progress Notes (Signed)
[employer] - [place] at [facility]     PATIENT NAME: Alexandra PippinsMary Goodman    MR#:  [id number]  DATE OF BIRTH:  03/20/1960  SUBJECTIVE:   Patient off BiPAP today, had a bowel movement this morning.  Still has some shortness of breath.  REVIEW OF SYSTEMS:    Review of Systems  Constitutional: Negative for chills and fever.  HENT: Negative for congestion and tinnitus.   Eyes: Negative for blurred vision and double vision.  Respiratory: Positive for shortness of breath. Negative for cough and wheezing.   Cardiovascular: Negative for chest pain, orthopnea and PND.  Gastrointestinal: Negative for abdominal pain, constipation, diarrhea, nausea and vomiting.  Genitourinary: Negative for dysuria and hematuria.  Skin: Positive for rash.  Neurological: Positive for weakness (generalized. ). Negative for dizziness, sensory change and focal weakness.  All other systems reviewed and are negative.   Nutrition: Carb control Tolerating Diet: Yes Tolerating PT: Await Eval.   DRUG ALLERGIES:   Allergies  Allergen Reactions  . Skin Protectants, Misc. Rash  . Flagyl [Metronidazole]   . Gabapentin   . Levetiracetam   . Tape Other (See Comments)    blisters  . Ace Inhibitors Rash  . Cefazolin Rash    Rash developed on cefazolin while on long-term treatment (was on 3-4wks)  . Ertapenem Rash  . Sertraline Hcl Rash    VITALS:  Blood pressure (!) 109/51, pulse 82, temperature 98.2 F (36.8 C), temperature source Axillary, resp. rate 12, height 5\' 3"  (1.6 m), weight 72.2 kg, SpO2 94 %.  PHYSICAL EXAMINATION:   Physical Exam  GENERAL:  58 y.o.-year-old patient sitting up in chair in mild Resp. Distress.   EYES: Pupils equal, round, reactive to light and accommodation. No scleral icterus. Extraocular muscles intact.  HEENT: Head atraumatic, normocephalic. Oropharynx and nasopharynx clear.  NECK:  Supple, no jugular venous distention. No thyroid enlargement, no tenderness.   LUNGS: Normal breath sounds bilaterally, no wheezing, rales, rhonchi. + use of accessory muscles of respiration.  CARDIOVASCULAR: S1, S2 normal. No murmurs, rubs, or gallops.  ABDOMEN: Soft, nontender, nondistended. Bowel sounds present. No organomegaly or mass.  EXTREMITIES: No cyanosis, clubbing or edema b/l.   Left sided BKA NEUROLOGIC: Cranial nerves II through XII are intact. No focal Motor or sensory deficits b/l.  Globally weak. PSYCHIATRIC: The patient is alert and oriented x 3.  SKIN: No obvious rash, lesion, or ulcer.  Patient has a purpuric rash on her arms and legs as seen below          LABORATORY PANEL:   CBC Recent Labs  Lab 05/25/18 0453  WBC 5.1  HGB 8.2*  HCT 27.7*  PLT 369   ------------------------------------------------------------------------------------------------------------------  Chemistries  Recent Labs  Lab 05/23/18 0529  05/25/18 0453  NA 135   < > 133*  K 4.5   < > 5.4*  CL 98   < > 98  CO2 27   < > 27  GLUCOSE 188*   < > 128*  BUN 45*   < > 71*  CREATININE 1.57*   < > 2.23*  CALCIUM 8.0*   < > 8.0*  MG 2.0  --   --    < > = values in this interval not displayed.   ------------------------------------------------------------------------------------------------------------------  Cardiac Enzymes No results for input(s): TROPONINI in the last 168 hours. ------------------------------------------------------------------------------------------------------------------  RADIOLOGY:  No results found.   ASSESSMENT AND PLAN:   58 year old female with past medical history of COPD, diabetes, peripheral  vascular disease, CHF, chronic pain, history of previous CVA previous history of MSSA bacteremia secondary to discitis who presented to the hospital due to shortness of breath.  1.  Acute on chronic respiratory failure with hypoxia- secondary to underlying COPD and also CHF. -Off diuresis as her creatinine was trending up.  Continue  BiPAP support and further COPD treatment as mentioned below.  2.  Acute on chronic diastolic congestive heart failure-Improved with diuresis.  Creatinine was starting to rise and therefore Lasix discontinued  - continues to require intermitted Bipap overnight.   3.  History of discitis with MSSA bacteremia- continue vancomycin for now.  Patient was on Ancef but developed a rash and therefore has been switched to vancomycin.   - appreciate ID input and cont. Vanc until 06/04/18 and outpatient follow with Neurosurgery.   4.  Rash-patient has purpuric rash on her upper and lower extremities -Initially thought to be secondary to Ancef but after seen by ID they think this could be a warfarin induced purpura/?? Vasculitis  - awaiting Derm to do a biopsy.   -Coumadin has been stopped presently.  She is also off Ancef  5.  Hx of atrial fibrillation-rate controlled.  Continue amiodarone.   - hold coumadin due to above.   6.  COPD- mild acute exacerbation.  She has bad end-stage COPD. -   Continue, prednisone, scheduled duonebs.    7.  Hypothyroidism-continue Synthroid.  8.  Diabetes type 2 without complication- continue sliding scale insulin.  Blood sugar stable.  9.  Hyperlipidemia-continue atorvastatin.  10.  Anxiety-continue Xanax.  Her prognosis is poor given her end-stage COPD and multiple comorbidities.  Palliative Care consult appreciated and plan for possible family meeting with daughter remotely tomorrow.   All the records are reviewed and case discussed with Care Management/Social Worker. Management plans discussed with the patient, family and they are in agreement.  CODE STATUS: Full code  DVT Prophylaxis: Coumadin  TOTAL TIME TAKING CARE OF THIS PATIENT: 30 minutes.   POSSIBLE D/C IN unclear, DEPENDING ON CLINICAL CONDITION and progress   [place] Siren M.D on 05/25/2018 at 2:16 PM  Between 7am to 6pm - Pager - [phone]  After 6pm go to [email or web address] - Air traffic controller  [facility] [place] [facility]  Office  [phone]  CC: Primary care physician; Floreen Comber, MD

## 2018-05-25 NOTE — Consult Note (Signed)
Pharmacy Antibiotic Note  Alexandra Goodman is a 58 y.o. female admitted on 02-Jun-2018 with COPD exacerbation. Patient was recently discharged with MSSA bacteremia and was receiving cefazolin via PICC at home with expected end date of approximately 06/04/18. Patient developed a rash initially attributed to cefazolin and was then switched to vancomycin to continue therapy. ID is following the patient.  Pharmacy has been consulted for Vancomycin dosing.  Plan: Random vancomycin level 24. Last dose of vancomycin 5/4 am. Hopefully the patient's SCr has peaked and is going to improve. Will continue to hold vancomycin and reassess renal function in the morning, along with another random vancomycin level.  Will plan to redose vancomycin when level falls below 15. Possibility that patient may switch to daptomycin if renal function worsens.  Height: 5\' 3"  (160 cm) Weight: 159 lb 2.8 oz (72.2 kg) IBW/kg (Calculated) : 52.4  Temp (24hrs), Avg:97.9 F (36.6 C), Min:97.6 F (36.4 C), Max:98.2 F (36.8 C)  Recent Labs  Lab 05/20/18 0353 05/21/18 0408 05/22/18 0533 05/22/18 1230 05/23/18 0529 05/24/18 0428 05/24/18 1116 05/24/18 1440 05/25/18 0453 05/25/18 0455  WBC 4.8 5.2  --  5.6  --  5.7  --   --  5.1  --   CREATININE 0.95 0.86 1.31*  --  1.57* 1.99*  --  2.24* 2.23*  --   VANCOTROUGH  --   --   --   --   --   --  30*  --   --   --   VANCORANDOM  --   --   --   --   --   --   --   --   --  24    Estimated Creatinine Clearance: 26.5 mL/min (A) (by C-G formula based on SCr of 2.23 mg/dL (H)).    Allergies  Allergen Reactions  . Skin Protectants, Misc. Rash  . Flagyl [Metronidazole]   . Gabapentin   . Levetiracetam   . Tape Other (See Comments)    blisters  . Ace Inhibitors Rash  . Cefazolin Rash    Rash developed on cefazolin while on long-term treatment (was on 3-4wks)  . Ertapenem Rash  . Sertraline Hcl Rash    Antimicrobials this admission: 4/27 Azithromycin x 1 4/27 Cefepime  x 1 4/27 Levofloxacin >> 5/2 4/27 Vancomycin >>  Dose adjustments this admission: 4/28 Increase Vancomycin from 1250 mg q24 to 1750 mg q36 (starting 4/29) 4/30 Increase Vancomycin to 1500 mg IV q24h from 1750 mg IV q36h starting 5/1 5/1 Decrease Vancomycin from 1500 mg IV q24h to 1250 mg q24h.  5/3 Decrease Vancomycin from 1250 mg IV q24h to 1000 mg q24h. 5/5 Random vanc level 30 >> hold vancomycin 5/6 Random vanc level 24 >> hold vancomycin  Microbiology results: 4/27 BCx: no growth  4/27 Sputum: few candida albicans  4/27 MRSA PCR: negative  4/27 UCx: no growth  4/27 COVID: negative   Thank you for allowing pharmacy to be a part of this patient's care.  Pricilla Riffle, PharmD Pharmacy Resident  05/25/2018 2:45 PM

## 2018-05-25 NOTE — Progress Notes (Signed)
Date of Admission:  07-12-18     ID: Alexandra Goodman is a 58 y.o. female  Active Problems:   Acute exacerbation of chronic obstructive pulmonary disease (COPD) (HCC) Purpuric rash    Subjective: Off the bipap mask- weak and sob  Medications:  . amiodarone  200 mg Oral BID  . aspirin EC  81 mg Oral Daily  . chlorhexidine  15 mL Mouth Rinse BID  . docusate sodium  100 mg Oral BID  . FLUoxetine  20 mg Oral Daily  . fluticasone  1 spray Each Nare Daily  . guaiFENesin  600 mg Oral BID  . insulin aspart  0-15 Units Subcutaneous TID WC  . insulin aspart  0-5 Units Subcutaneous QHS  . insulin glargine  15 Units Subcutaneous Daily  . ipratropium-albuterol  3 mL Nebulization Q6H  . levothyroxine  88 mcg Oral Daily  . mouth rinse  15 mL Mouth Rinse q12n4p  . mirabegron ER  25 mg Oral Daily  . montelukast  10 mg Oral Daily  . multivitamin with minerals  1 tablet Oral Daily  . nystatin  5 mL Oral QID  . polyethylene glycol  17 g Oral Daily  . predniSONE  40 mg Oral Q breakfast  . Ensure Max Protein  11 oz Oral BID  . sodium chloride flush  10-40 mL Intracatheter Q12H  . sodium zirconium cyclosilicate  10 g Oral Daily  . traZODone  150 mg Oral QHS  . vancomycin variable dose per unstable renal function (pharmacist dosing)   Does not apply See admin instructions  . vitamin C  250 mg Oral BID    Objective: Vital signs in last 24 hours: Temp:  [96.6 F (35.9 C)-98.2 F (36.8 C)] 98.2 F (36.8 C) (05/06 0800) Pulse Rate:  [74-90] 82 (05/06 1000) Resp:  [12-22] 12 (05/06 1000) BP: (89-116)/(43-80) 109/51 (05/06 1000) SpO2:  [87 %-99 %] 94 % (05/06 1000) FiO2 (%):  [45 %-60 %] 45 % (05/06 1000)  PHYSICAL EXAM:  General: pale, in resp distress Purpuric spots over the face at the pressure point of the face mask Lungs: b/l air entry rhonchi Heart: irregular Abdomen: Soft, non-tender,not distended. Bowel sounds normal. No masses Extremities:   03-04-2018      05/22/18      Rt great toe ulcer Skin: as above Lymph: Cervical, supraclavicular normal. Neurologic: Grossly non-focal  Lab Results Recent Labs    05/24/18 0428 05/24/18 1440 05/24/18 1943 05/25/18 0453  WBC 5.7  --   --  5.1  HGB 7.5*  --   --  8.2*  HCT 25.1*  --   --  27.7*  NA 131* 132*  --  133*  K 5.2* 5.3* 5.4* 5.4*  CL 96* 95*  --  98  CO2 25 27  --  27  BUN 60* 65*  --  71*  CREATININE 1.99* 2.24*  --  2.23*   Liver Panel No results for input(s): PROT, ALBUMIN, AST, ALT, ALKPHOS, BILITOT, BILIDIR, IBILI in the last 72 hours. Sedimentation Rate No results for input(s): ESRSEDRATE in the last 72 hours. C-Reactive Protein No results for input(s): CRP in the last 72 hours.  Microbiology:  Studies/Results: No results found.   Assessment/Plan: COPDexacerbation - improving   Purpuric/ecchymotic lesions extremities/other areas -very unusual for cefazolin allergy - looks like it started as EM but has become ecchymotic   r/o warfarin induced purpura /necrosis, VS cefazolin allergy  she recently had a high INR (Mid  April)and warfarin was stopped and reintroduced - need Dermatology consult and possible biopsy to look for microthrombi.spoke to [facility] yesterday- his partner will see her today according to him Cefazolin was stopped a week ago and she is on vancomycin but the lesions apparently are getting worse   MSSA bacteremia  Lumbar spine extensive infectionL2-L3 vertebral osteo, 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 Paraspinal phlemon and psoas muscle involvement She was on Cefazolin 2 grams IV q8  now on vancoand will be getting it until 06/04/18- may need more  Will have to follow up with Dr.Cook ( neurosurgeon as OP)  Rt toeulcer-does not look grossly infected- follow with her podiatrist at [facility]  Anemia-  She was on coumadin, plavix and aspirin- currently on the latter only  AKI- concern with vanco - followed by nephrologist- vasculitis  being ruled out- may have to change vanco to dapto if it worsens  PAD   Current smoker  H/o Left BKA  Rt posterior achilles ulcer - healed- followed at [facility]   DM-on metformin and insulin  Afib on amidarone  Discussed the management with patient and care team

## 2018-05-25 NOTE — Consult Note (Signed)
ANTICOAGULATION CONSULT NOTE  Pharmacy Consult for Apixaban  Indication: Previous DVT  Allergies  Allergen Reactions  . Skin Protectants, Misc. Rash  . Flagyl [Metronidazole]   . Gabapentin   . Levetiracetam   . Tape Other (See Comments)    blisters  . Ace Inhibitors Rash  . Cefazolin Rash    Rash developed on cefazolin while on long-term treatment (was on 3-4wks)  . Ertapenem Rash  . Sertraline Hcl Rash    Patient Measurements: Height: 5\' 3"  (160 cm) Weight: 159 lb 2.8 oz (72.2 kg) IBW/kg (Calculated) : 52.4  Vital Signs: Temp: 97.8 F (36.6 C) (05/06 0200) Temp Source: Axillary (05/06 0200) BP: 105/61 (05/06 0600) Pulse Rate: 81 (05/06 0600)  Labs: Recent Labs    05/22/18 1230  05/23/18 0529 05/24/18 0428 05/24/18 1440 05/25/18 0453  HGB 8.1*  --   --  7.5*  --  8.2*  HCT 27.3*  --   --  25.1*  --  27.7*  PLT 353  --   --  325  --  369  LABPROT  --   --  22.2* 31.5*  --  36.7*  INR  --   --  2.0* 3.1*  --  3.8*  CREATININE  --    < > 1.57* 1.99* 2.24* 2.23*   < > = values in this interval not displayed.    Estimated Creatinine Clearance: 26.5 mL/min (A) (by C-G formula based on SCr of 2.23 mg/dL (H)).   Medical History: Past Medical History:  Diagnosis Date  . Asthma   . CHF (congestive heart failure) (HCC)   . Chronic pain   . COPD (chronic obstructive pulmonary disease) (HCC)   . Diabetes mellitus (HCC)   . Peripheral vascular disease (HCC)   . 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 Patient takes amiodarone 200 mg PO BID PTA (continued inpatient).  Assessment: 58 y.o. female admitted on 05/08/2018 with COPD exacerbation. Patient was recently discharged with pneumonia and received cefazolin via PICC at home. Patient antibiotic regimen changed to vancomycin.   Goal of Therapy:  Monitor platelets by anticoagulation protocol: Yes   Plan:  INR remains  elevated, 3.8. Patient with possible warfarin induced purpura/necrosis. Plan to initiate apixaban when INR < 2.   Pharmacy will continue to monitor and adjust per consult.   Geramy Lamorte L,  05/25/2018 7:09 AM

## 2018-05-25 NOTE — Consult Note (Signed)
[facility]  CONSULT NOTE    Date: 05/25/2018                  Patient Name:  Alexandra Goodman  MRN: [id number]  DOB: 10-06-1960  Age / Sex: 58 y.o., female         PCP: Erline Levine, MD                 Service Requesting Consult: Dr. Patsey Berthold                 Reason for Consult: Acute renal failure            History of Present Illness: Alexandra Goodman admitted to [facility] on 05/08/2018 for [place]and ischemia ([place]) [I24.8] Elevated troponin [R79.89] COPD exacerbation ([place]) [J44.1] Elevated lactic acid level [R79.89] Elevated d-dimer [R79.89] Acute respiratory failure with hypoxia and hypercapnia ([place]) [J96.01, J96.02] Pneumonia [J18.9]  Nephrology consulted for worsening creatinine. Patient has developed ecchymotic purpuric lesions that are thought to be secondary to cefazolin verus warfarin. She has been started on steroids.   Continues to require a high amount of oxygen.   Medications: Outpatient medications: Medications Prior to Admission  Medication Sig Dispense Refill Last Dose  . albuterol (PROAIR HFA) 108 (90 Base) MCG/ACT inhaler Inhale 2 puffs into the lungs every 4 (four) hours as needed for wheezing.   prn at prn  . amiodarone (PACERONE) 200 MG tablet Take 1 tablet (200 mg total) by mouth 2 (two) times daily for 30 days. 60 tablet 0 unknown at unknown  . aspirin EC 81 MG EC tablet Take 1 tablet (81 mg total) by mouth daily for 30 days. 30 tablet 0 unknown at unknown  . atorvastatin (LIPITOR) 80 MG tablet Take 80 mg by mouth Nightly.   unknown at unknown  . ceFAZolin (ANCEF) IVPB Inject 2 g into the vein every 8 (eight) hours. Indication:  MSSA bacteremia and lumbar discitis Last Day of Therapy:  06/04/2018 Labs - Once weekly (on Mon):  CBC/D and CMP, Labs - Every other week (on Mon):  ESR and CRP 114 Units 0 Past Week at Unknown time  . clopidogrel (PLAVIX) 75 MG tablet Take 75 mg by mouth daily.   unknown at unknown  . famotidine (PEPCID)  40 MG tablet Take 40 mg by mouth every evening.   unknown at unknown  . FLUoxetine (PROZAC) 20 MG capsule Take 20 mg by mouth daily.   unknown at unknown  . fluticasone (FLONASE) 50 MCG/ACT nasal spray Place 1 spray into both nostrils daily.   unknown at unknown  . fluticasone-salmeterol (ADVAIR HFA) 230-21 MCG/ACT inhaler Inhale 2 puffs into the lungs 2 (two) times daily.   unknown at unknown  . furosemide (LASIX) 40 MG tablet Take 40-80 mg by mouth 2 (two) times daily. 80 mg every morning and 40 mg every evening   unknown at unknown  . Insulin Glargine (LANTUS SOLOSTAR) 100 UNIT/ML Solostar Pen Inject 46 Units into the skin at bedtime.   unknown at unknown  . levothyroxine (SYNTHROID, LEVOTHROID) 88 MCG tablet Take 88 mcg by mouth daily.   unknown at unknown  . losartan (COZAAR) 100 MG tablet Take 100 mg by mouth daily.   unknown at unknown  . metFORMIN (GLUCOPHAGE) 1000 MG tablet Take 1,000 mg by mouth 2 (two) times daily.   unknown at unknown  . mirabegron ER (MYRBETRIQ) 25 MG TB24 tablet Take 25 mg by mouth daily.   unknown at  unknown  . montelukast (SINGULAIR) 10 MG tablet Take 10 mg by mouth daily.   unknown at unknown  . naloxone (NARCAN) nasal spray 4 mg/0.1 mL Place 4 mg into the nose once.   prn at prn  . nitroGLYCERIN (NITROSTAT) 0.4 MG SL tablet Place 0.4 mg under the tongue as needed.   prn at prn  . oxyCODONE-acetaminophen (PERCOCET) 10-325 MG tablet Take 1 tablet by mouth every 6 (six) hours as needed for pain. 20 tablet 0 prn at prn  . senna-docusate (SENOKOT-S) 8.6-50 MG tablet Take 2 tablets by mouth daily.   unknown at unknown  . tiotropium (SPIRIVA HANDIHALER) 18 MCG inhalation capsule Place 1 capsule into inhaler and inhale daily.   unknown at unknown  . traZODone (DESYREL) 50 MG tablet Take 150 mg by mouth at bedtime.   unknown at unknown  . warfarin (COUMADIN) 5 MG tablet Take 7.5 mg by mouth daily.   unknown at unknown    Current medications: Current  Facility-Administered Medications  Medication Dose Route Frequency Provider Last Rate Last Dose  . albuterol (PROVENTIL) (2.5 MG/3ML) 0.083% nebulizer solution 2.5 mg  2.5 mg Nebulization Q4H PRN Seals, Angela H, NP   2.5 mg at 05/23/18 2143  . amiodarone (PACERONE) tablet 200 mg  200 mg Oral BID Gardiner Barefoot H, NP   200 mg at 05/25/18 1053  . aspirin EC tablet 81 mg  81 mg Oral Daily Seals, Theo Dills, NP   81 mg at 05/25/18 1029  . chlorhexidine (PERIDEX) 0.12 % solution 15 mL  15 mL Mouth Rinse BID Ottie Glazier, MD   15 mL at 05/25/18 1045  . docusate sodium (COLACE) capsule 100 mg  100 mg Oral BID Henreitta Leber, MD   100 mg at 05/23/18 2124  . FLUoxetine (PROZAC) capsule 20 mg  20 mg Oral Daily Seals, Angela H, NP   20 mg at 05/25/18 1031  . fluticasone (FLONASE) 50 MCG/ACT nasal spray 1 spray  1 spray Each Nare Daily Seals, Theo Dills, NP   1 spray at 05/25/18 1055  . guaiFENesin (MUCINEX) 12 hr tablet 600 mg  600 mg Oral BID Mansy, Arvella Merles, MD   Stopped at 05/24/18 2226  . insulin aspart (novoLOG) injection 0-15 Units  0-15 Units Subcutaneous TID WC Ottie Glazier, MD   2 Units at 05/25/18 0759  . insulin aspart (novoLOG) injection 0-5 Units  0-5 Units Subcutaneous QHS Ottie Glazier, MD   5 Units at 05/23/18 2155  . insulin glargine (LANTUS) injection 15 Units  15 Units Subcutaneous Daily Awilda Bill, NP   15 Units at 05/25/18 1035  . ipratropium-albuterol (DUONEB) 0.5-2.5 (3) MG/3ML nebulizer solution 3 mL  3 mL Nebulization Q6H Seals, Angela H, NP   3 mL at 05/25/18 0725  . levothyroxine (SYNTHROID) tablet 88 mcg  88 mcg Oral Daily Mayer Camel, NP   88 mcg at 05/25/18 0458  . LORazepam (ATIVAN) injection 1 mg  1 mg Intravenous Q8H PRN Tyler Pita, MD   1 mg at 05/25/18 1040  . MEDLINE mouth rinse  15 mL Mouth Rinse q12n4p Ottie Glazier, MD   15 mL at 05/24/18 1612  . mirabegron ER (MYRBETRIQ) tablet 25 mg  25 mg Oral Daily Seals, Angela H, NP   25 mg at 05/25/18 1030   . montelukast (SINGULAIR) tablet 10 mg  10 mg Oral Daily Seals, Theo Dills, NP   10 mg at 05/23/18 0905  . morphine 2 MG/ML injection  1-2 mg  1-2 mg Intravenous Q3H PRN Darel Hong D, NP   2 mg at 05/25/18 0800  . multivitamin with minerals tablet 1 tablet  1 tablet Oral Daily Loletha Grayer, MD   1 tablet at 05/23/18 0905  . nystatin (MYCOSTATIN) 100000 UNIT/ML suspension 500,000 Units  5 mL Oral QID Ottie Glazier, MD   500,000 Units at 05/23/18 2126  . oxyCODONE-acetaminophen (PERCOCET/ROXICET) 5-325 MG per tablet 1 tablet  1 tablet Oral Q8H PRN Seals, Theo Dills, NP   1 tablet at 05/25/18 0458   And  . oxyCODONE (Oxy IR/ROXICODONE) immediate release tablet 5 mg  5 mg Oral Q8H PRN Gardiner Barefoot H, NP   5 mg at 05/25/18 0458  . phenylephrine (NEO-SYNEPHRINE) 0.25 % nasal spray 1 spray  1 spray Each Nare Q6H PRN Tukov-Yual, Magdalene S, NP      . polyethylene glycol (MIRALAX / GLYCOLAX) packet 17 g  17 g Oral Daily Henreitta Leber, MD   17 g at 05/25/18 1056  . predniSONE (DELTASONE) tablet 40 mg  40 mg Oral Q breakfast Ottie Glazier, MD   40 mg at 05/25/18 1029  . protein supplement (ENSURE MAX) liquid  11 oz Oral BID Loletha Grayer, MD   11 oz at 05/23/18 0903  . sodium chloride flush (NS) 0.9 % injection 10-40 mL  10-40 mL Intracatheter Q12H Leonel Ramsay, MD   10 mL at 05/24/18 2227  . sodium chloride flush (NS) 0.9 % injection 10-40 mL  10-40 mL Intracatheter PRN Leonel Ramsay, MD      . sodium zirconium cyclosilicate (LOKELMA) packet 10 g  10 g Oral Daily Jaleyah Longhi, MD      . traZODone (DESYREL) tablet 150 mg  150 mg Oral QHS Seals, Angela H, NP   150 mg at 05/23/18 2124  . vancomycin variable dose per unstable renal function (pharmacist dosing)   Does not apply See admin instructions Charlett Nose, RPH      . vitamin C (ASCORBIC ACID) tablet 250 mg  250 mg Oral BID Loletha Grayer, MD   250 mg at 05/23/18 2125      Allergies: Allergies  Allergen  Reactions  . Skin Protectants, Misc. Rash  . Flagyl [Metronidazole]   . Gabapentin   . Levetiracetam   . Tape Other (See Comments)    blisters  . Ace Inhibitors Rash  . Cefazolin Rash    Rash developed on cefazolin while on long-term treatment (was on 3-4wks)  . Ertapenem Rash  . Sertraline Hcl Rash      Past Medical History: Past Medical History:  Diagnosis Date  . Asthma   . CHF (congestive heart failure) ([place])   . Chronic pain   . COPD (chronic obstructive pulmonary disease) ([place])   . Diabetes mellitus ([place])   . Peripheral vascular disease ([place])   . Stroke (cerebrum) [facility])      Past Surgical History: Past Surgical History:  Procedure Laterality Date  . below the knee LLE amputation Left      Family History: Family History  Problem Relation Age of Onset  . Heart disease Mother   . Heart disease Father      Social History: Social History   Socioeconomic History  . Marital status: Single    Spouse name: Not on file  . Number of children: Not on file  . Years of education: Not on file  . Highest education level: Not on file  Occupational History  . Not  on file  Social Needs  . Financial resource strain: Not on file  . Food insecurity:    Worry: Not on file    Inability: Not on file  . Transportation needs:    Medical: Not on file    Non-medical: Not on file  Tobacco Use  . Smoking status: Current Every Day Smoker    Packs/day: 2.00    Years: 45.00    Pack years: 90.00    Types: Cigarettes  . Smokeless tobacco: Never Used  Substance and Sexual Activity  . Alcohol use: Not Currently  . Drug use: Never  . Sexual activity: Not on file  Lifestyle  . Physical activity:    Days per week: Not on file    Minutes per session: Not on file  . Stress: Not on file  Relationships  . Social connections:    Talks on phone: Not on file    Gets together: Not on file    Attends religious service: Not on file    Active member of club or organization: Not  on file    Attends meetings of clubs or organizations: Not on file    Relationship status: Not on file  . Intimate partner violence:    Fear of current or ex partner: Not on file    Emotionally abused: Not on file    Physically abused: Not on file    Forced sexual activity: Not on file  Other Topics Concern  . Not on file  Social History Narrative  . Not on file     Review of Systems: Review of Systems  Constitutional: Positive for malaise/fatigue. Negative for chills, diaphoresis, fever and weight loss.  HENT: Negative.  Negative for congestion, ear discharge, ear pain, hearing loss, nosebleeds, sinus pain, sore throat and tinnitus.   Eyes: Negative.  Negative for blurred vision, double vision, photophobia, pain, discharge and redness.  Respiratory: Positive for cough, sputum production, shortness of breath and wheezing. Negative for hemoptysis and stridor.   Cardiovascular: Positive for leg swelling. Negative for chest pain, palpitations, orthopnea, claudication and PND.  Gastrointestinal: Negative.  Negative for abdominal pain, blood in stool, constipation, diarrhea, heartburn, melena, nausea and vomiting.  Genitourinary: Negative.  Negative for dysuria, flank pain, frequency, hematuria and urgency.  Musculoskeletal: Negative.  Negative for back pain, falls, joint pain, myalgias and neck pain.  Skin: Positive for itching and rash.  Neurological: Negative for dizziness, tingling, tremors, sensory change, speech change, focal weakness, seizures, loss of consciousness, weakness and headaches.  Endo/Heme/Allergies: Negative.  Negative for environmental allergies and polydipsia. Does not bruise/bleed easily.  Psychiatric/Behavioral: Negative.  Negative for depression, hallucinations, memory loss, substance abuse and suicidal ideas. The patient is not nervous/anxious and does not have insomnia.     Vital Signs: Blood pressure (!) 109/51, pulse 82, temperature 98.2 F (36.8 C),  temperature source Axillary, resp. rate 12, height '5\' 3"'$  (1.6 m), weight 72.2 kg, SpO2 94 %.  Weight trends: Filed Weights   04/26/2018 0900 05/21/18 0418 05/22/18 0500  Weight: 72 kg 70.2 kg 72.2 kg    Physical Exam: General: Critically ill   Head: Normocephalic, atraumatic. Moist oral mucosal membranes  Eyes: Anicteric, PERRL  Neck: Supple, trachea midline  Lungs:  +wheezes  Heart: irregular  Abdomen:  Soft, nontender, obese  Extremities:  left BKA, 1+ edema  Neurologic: Nonfocal, moving all four extremities  Skin: +purpura         Lab results: Basic Metabolic Panel: Recent Labs  Lab 05/23/18 0529  05/24/18 0428 05/24/18 1440 05/24/18 1943 05/25/18 0453  NA 135 131* 132*  --  133*  K 4.5 5.2* 5.3* 5.4* 5.4*  CL 98 96* 95*  --  98  CO2 '27 25 27  '$ --  27  GLUCOSE 188* 274* 178*  --  128*  BUN 45* 60* 65*  --  71*  CREATININE 1.57* 1.99* 2.24*  --  2.23*  CALCIUM 8.0* 8.1* 8.3*  --  8.0*  MG 2.0  --   --   --   --   PHOS 4.7*  --   --   --   --     Liver Function Tests: No results for input(s): AST, ALT, ALKPHOS, BILITOT, PROT, ALBUMIN in the last 168 hours. No results for input(s): LIPASE, AMYLASE in the last 168 hours. No results for input(s): AMMONIA in the last 168 hours.  CBC: Recent Labs  Lab 05/20/18 0353 05/21/18 0408 05/22/18 1230 05/24/18 0428 05/25/18 0453  WBC 4.8 5.2 5.6 5.7 5.1  NEUTROABS  --   --  4.6  --  3.7  HGB 9.1* 7.5* 8.1* 7.5* 8.2*  HCT 30.9* 24.8* 27.3* 25.1* 27.7*  MCV 89.8 88.3 88.6 87.8 87.7  PLT 374 336 353 325 369    Cardiac Enzymes: No results for input(s): CKTOTAL, CKMB, CKMBINDEX, TROPONINI in the last 168 hours.  BNP: Invalid input(s): POCBNP  CBG: Recent Labs  Lab 05/24/18 1142 05/24/18 1629 05/24/18 2233 05/25/18 0743 05/25/18 1117  GLUCAP 203* 163* 126* 143* 138*    Microbiology: Results for orders placed or performed during the hospital encounter of 05/19/2018  SARS Coronavirus 2 [facility] order,  Performed in [place] hospital lab)     Status: None   Collection Time: 05/04/2018  4:28 AM  Result Value Ref Range Status   SARS Coronavirus 2 NEGATIVE NEGATIVE Final    Comment: (NOTE) If result is NEGATIVE SARS-CoV-2 target nucleic acids are NOT DETECTED. The SARS-CoV-2 RNA is generally detectable in upper and lower  respiratory specimens during the acute phase of infection. The lowest  concentration of SARS-CoV-2 viral copies this assay can detect is 250  copies / mL. A negative result does not preclude SARS-CoV-2 infection  and should not be used as the sole basis for treatment or other  patient management decisions.  A negative result may occur with  improper specimen collection / handling, submission of specimen other  than nasopharyngeal swab, presence of viral mutation(s) within the  areas targeted by this assay, and inadequate number of viral copies  (<250 copies / mL). A negative result must be combined with clinical  observations, patient history, and epidemiological information. If result is POSITIVE SARS-CoV-2 target nucleic acids are DETECTED. The SARS-CoV-2 RNA is generally detectable in upper and lower  respiratory specimens dur ing the acute phase of infection.  Positive  results are indicative of active infection with SARS-CoV-2.  Clinical  correlation with patient history and other diagnostic information is  necessary to determine patient infection status.  Positive results do  not rule out bacterial infection or co-infection with other viruses. If result is PRESUMPTIVE POSTIVE SARS-CoV-2 nucleic acids MAY BE PRESENT.   A presumptive positive result was obtained on the submitted specimen  and confirmed on repeat testing.  While 2019 novel coronavirus  (SARS-CoV-2) nucleic acids may be present in the submitted sample  additional confirmatory testing may be necessary for epidemiological  and / or clinical management purposes  to differentiate between  SARS-CoV-2  and other  Sarbecovirus currently known to infect humans.  If clinically indicated additional testing with an alternate test  methodology [phone]) is advised. The SARS-CoV-2 RNA is generally  detectable in upper and lower respiratory sp ecimens during the acute  phase of infection. The expected result is Negative. Fact Sheet for Patients:  StrictlyIdeas.no Fact Sheet for Healthcare Providers: BankingDealers.co.za This test is not yet approved or cleared by the [place] FDA and has been authorized for detection and/or diagnosis of SARS-CoV-2 by FDA under an Emergency Use Authorization (EUA).  This EUA will remain in effect (meaning this test can be used) for the duration of the COVID-19 declaration under Section 564(b)(1) of the Act, 21 U.S.C. section 360bbb-3(b)(1), unless the authorization is terminated or revoked sooner. Performed at [facility], [address] [place], [place] [address]   Blood Culture (routine x 2)     Status: None   Collection Time: 04/25/2018  4:51 AM  Result Value Ref Range Status   Specimen Description BLOOD RIGHT FOREARM  Final   Special Requests   Final    BOTTLES DRAWN AEROBIC AND ANAEROBIC Blood Culture results may not be optimal due to an excessive volume of blood received in culture bottles   Culture   Final    NO GROWTH 5 DAYS Performed at [facility], [address] [place], [place] [address]    Report Status 05/21/2018 FINAL  Final  Blood Culture (routine x 2)     Status: None   Collection Time: 05/11/2018  4:51 AM  Result Value Ref Range Status   Specimen Description BLOOD RIGHT HAND  Final   Special Requests   Final    BOTTLES DRAWN AEROBIC AND ANAEROBIC Blood Culture results may not be optimal due to an inadequate volume of blood received in culture bottles   Culture   Final    NO GROWTH 5 DAYS Performed at [facility], [address] [place], [place]  [address]    Report Status 05/21/2018 FINAL  Final  MRSA PCR Screening     Status: None   Collection Time: 05/17/2018  5:53 PM  Result Value Ref Range Status   MRSA by PCR NEGATIVE NEGATIVE Final    Comment:        The GeneXpert MRSA Assay (FDA approved for NASAL specimens only), is one component of a comprehensive MRSA colonization surveillance program. It is not intended to diagnose MRSA infection nor to guide or monitor treatment for MRSA infections. Performed at [facility], [address] [place], [place] [address]   Culture, sputum-assessment     Status: None   Collection Time: 04/29/2018  8:00 PM  Result Value Ref Range Status   Specimen Description SPUTUM  Final   Special Requests NONE  Final   Sputum evaluation   Final    THIS SPECIMEN IS ACCEPTABLE FOR SPUTUM CULTURE Performed at [facility], [address], [place], [place] [address]    Report Status 04/23/2018 FINAL  Final  Culture, respiratory     Status: None   Collection Time: 05/06/2018  8:00 PM  Result Value Ref Range Status   Specimen Description   Final    SPUTUM Performed at [facility], [address] [place], [place] [address]    Special Requests   Final    NONE Reflexed from [id number] Performed at [facility], [address] [place], [place] [address]    Gram Stain   Final    FEW SQUAMOUS  EPITHELIAL CELLS PRESENT NO WBC SEEN FEW YEAST Performed at [facility], [place] [address], [place], [place] [address]    Culture   Final    ABUNDANT CANDIDA ALBICANS FEW CANDIDA TROPICALIS    Report Status 05/19/2018 FINAL  Final  Urine Culture     Status: None   Collection Time: 04/28/2018  9:10 PM  Result Value Ref Range Status   Specimen Description   Final    URINE, CLEAN CATCH Performed at [facility], [address] [place], [place] [address]    Special Requests   Final    Normal Performed at [facility], [address]  [place], [place] [address]    Culture   Final    NO GROWTH Performed at [facility], [place] [address], [place], [place] [address]    Report Status 05/18/2018 FINAL  Final    Coagulation Studies: Recent Labs    05/23/18 0529 05/24/18 0428 05/25/18 0453  LABPROT 22.2* 31.5* 36.7*  INR 2.0* 3.1* 3.8*    Urinalysis: No results for input(s): COLORURINE, LABSPEC, PHURINE, GLUCOSEU, HGBUR, BILIRUBINUR, KETONESUR, PROTEINUR, UROBILINOGEN, NITRITE, LEUKOCYTESUR in the last 72 hours.  Invalid input(s): APPERANCEUR    Imaging:  No results found.   Assessment & Plan: Alexandra Goodman is a 58 y.o. white female with COPD, CVA, PVD, diabetes mellitus type II, hypertension, congestive heart failure, left BKA who was admitted to [facility] on 04/22/2018 for acute exacerbation of COPD, acute CHF exacerbation, and MSSA bacteremia.   1. Acute renal failure on chronic kidney disease stage III with proteinuria: baseline creatinine of 1.14, GFR of 53 on 04/26/2018 2. Hyponatremia 3. Hyperkalemia 4. Diabetes mellitus type II with chronic kidney disease: insulin dependent.   Plan  No IV contrast exposure. There is a possibility of vasculitis or AIN. ANCA titers sent yesterday. Urine eos sent today.  - IV LR bolus of 247m/hr given - No indication for dialysis.      LOS: [id number]5/6/202011:58 AM

## 2018-05-25 NOTE — Progress Notes (Signed)
Daily Progress Note   Patient Name: Alexandra Goodman       Date: 05/25/2018 DOB: 07-24-1960  Age: 58 y.o. MRN#: [id number] Attending Physician: Houston Siren, MD Primary Care Physician: Floreen Comber, MD Admit Date: 04/20/2018  Reason for Consultation/Follow-up: Establishing goals of care  Subjective: Patient is resting in bed on BIPAP. She states she feels better, and is hopeful to come off BIPAP.   We discussed her diagnosis, prognosis, and GOC. The difference between an aggressive medical intervention path and a comfort care path was discussed.  Values and goals of care important to patient and family were attempted to be elicited.  Discussed limitations of medical interventions to prolong quality of life for this patient at this time in this situation and discussed the concept of human mortality.  Patient initially changes the the subject upon attempting to discuss GOC. We discussed progressive nature of her COPD and importance of discussion and planning. She states she wants to continue life prolonging measures. She states her QOL is poor, but cannot think of a time/situation when she would want to change her focus more to comfort.  Previous notes reviewed and ventilator wishes have fluctuated. Asked her if she would want to be placed on the ventilator if her respiratory status declines, and she says "yes, for a short time". Inquired what a short time means to her, she states she does not know, but states she does not want a tracheostomy. Asked her if she would want chest compressions, shocks, or a breathing tube for cardio/pulmonary resusitation or if she would would want to be made comfortable, and she said "no", upon follow up confirmation of this response, she said "no, I do want it!"   She states it is scary to be here in the hospital alone. Discussed a family meeting. She is amenable to a family conversation with Melissa "when I get off this mask."  She states she cannot hear her daughter on the phone over the noise of the mask, and her daughter cannot understand what she is saying.   Will continue to follow. Will attempt family conversation tomorrow if able.  Length of Stay: 9  Current Medications: Scheduled Meds:  . amiodarone  200 mg Oral BID  . aspirin EC  81 mg Oral Daily  . chlorhexidine  15 mL  Mouth Rinse BID  . docusate sodium  100 mg Oral BID  . FLUoxetine  20 mg Oral Daily  . fluticasone  1 spray Each Nare Daily  . guaiFENesin  600 mg Oral BID  . insulin aspart  0-15 Units Subcutaneous TID WC  . insulin aspart  0-5 Units Subcutaneous QHS  . insulin glargine  15 Units Subcutaneous Daily  . ipratropium-albuterol  3 mL Nebulization Q6H  . levothyroxine  88 mcg Oral Daily  . mouth rinse  15 mL Mouth Rinse q12n4p  . mirabegron ER  25 mg Oral Daily  . montelukast  10 mg Oral Daily  . multivitamin with minerals  1 tablet Oral Daily  . nystatin  5 mL Oral QID  . polyethylene glycol  17 g Oral Daily  . predniSONE  40 mg Oral Q breakfast  . Ensure Max Protein  11 oz Oral BID  . sodium chloride flush  10-40 mL Intracatheter Q12H  . sodium zirconium cyclosilicate  10 g Oral Daily  . traZODone  150 mg Oral QHS  . vancomycin variable dose per unstable renal function (pharmacist dosing)   Does not apply See admin instructions  . vitamin C  250 mg Oral BID    Continuous Infusions:   PRN Meds: albuterol, LORazepam, morphine injection, oxyCODONE-acetaminophen **AND** oxyCODONE, phenylephrine, sodium chloride flush  Physical Exam Pulmonary:     Comments: BIPAP. Skin:    Findings: Rash present.  Neurological:     Mental Status: She is alert.             Vital Signs: BP (!) 101/43   Pulse 82   Temp 98.2 F (36.8 C) (Axillary)   Resp 13   Ht 5\' 3"  (1.6  m)   Wt 72.2 kg   SpO2 92%   BMI 28.20 kg/m  SpO2: SpO2: 92 % O2 Device: O2 Device: Bi-PAP O2 Flow Rate: O2 Flow Rate (L/min): 10 L/min  Intake/output summary:   Intake/Output Summary (Last 24 hours) at 05/25/2018 [id number] Last data filed at 05/25/2018 0200 Gross per 24 hour  Intake 321.24 ml  Output 0 ml  Net 321.24 ml   LBM: Last BM Date: 05/18/18 Baseline Weight: Weight: 66 kg Most recent weight: Weight: 72.2 kg       Palliative Assessment/Data:    Flowsheet Rows     Most Recent Value  Intake Tab  Referral Department  Critical care  Unit at Time of Referral  ICU  Palliative Care Primary Diagnosis  Pulmonary  Date Notified  05/23/18  Palliative Care Type  New Palliative care  Reason for referral  Clarify Goals of Care  Date of Admission  04/30/2018  # of days IP prior to Palliative referral  7  Clinical Assessment  Psychosocial & Spiritual Assessment  Palliative Care Outcomes      Patient Active Problem List   Diagnosis Date Noted  . Acute exacerbation of chronic obstructive pulmonary disease (COPD) (HCC) 05/02/2018  . Pneumonia 05/19/2018  . Bacteremia due to Staphylococcus aureus 05/13/2018  . Vertebral osteomyelitis (HCC) 05/13/2018  . Anemia 05/13/2018  . Pressure injury of skin 04/22/2018  . Sepsis (HCC) 04/20/2018    Palliative Care Assessment & Plan   Recommendations/Plan:  Will continue to follow. Plans for family phone meeting tomorrow if patient is able to be off BIPAP for conversation.   Code Status:    Code Status Orders  (From admission, onward)         Start     Ordered  05/04/2018 0703  Full code  Continuous     05/13/2018 0706        Code Status History    Date Active Date Inactive Code Status Order ID Comments User Context   05/12/2018 0437 04/26/2018 0706 Full Code [id number]  Loleta Rose, MD ED   04/20/2018 1926 04/27/2018 1843 Full Code [id number]  Judithe Modest, NP Inpatient   04/20/2018 1903 04/20/2018 1926 Full Code [id number]   Houston Siren, MD Inpatient    Advance Directive Documentation     Most Recent Value  Type of Advance Directive  Living will  Pre-existing out of facility DNR order (yellow form or pink MOST form)  -  "MOST" Form in Place?  -       Prognosis:  Poor overall   Care plan was discussed with RN  Thank you for allowing the Palliative Medicine Team to assist in the care of this patient.   Total Time 35 min Prolonged Time Billed no       Greater than 50%  of this time was spent counseling and coordinating care related to the above assessment and plan.  Morton Stall, NP  Please contact Palliative Medicine Team phone at [phone] for questions and concerns.

## 2018-05-25 NOTE — Progress Notes (Addendum)
Patient background: 58 year old current smoker, with known COPD GOLD class IV/D (end-stage) FEV1 less than 1 L in 2017.  Presented on 27 April with acute on chronic respiratory failure.  She has been intermittently BiPAP dependent.  Other issues include lumbar osteomyelitis, diastolic dysfunction, underlying anxiety disorder.  S) tolerating more.  Off BiPAP.  Sitting up in chair today.  Purpuric rash remains.   O) Oxygen Supplementation:BiPAP FiO2 (%):  [45 %-50 %] 45 % oxygen requirements decreased from yesterday.  Intake/Output: No intake/output data recorded.   Vitals: Vitals:   05/25/18 1900 05/25/18 2000  BP: (!) 91/48 102/87  Pulse: 87 88  Resp: 15 12  Temp: 98.2 F (36.8 C)   SpO2: (!) 83% (!) 87%     Exam: GENERAL: Mild  tachypnea off  BiPAP, on high flow O2.  Use of accessories of respirations that appears mostly chronic.  Appears markedly older than stated age.  Sitting up in chair, sallow complexion. HEENT: Pupils equal round and reactive, sclera anicteric.  Oral mucosa without thrush.  Mucous membranes moist. NECK: Supple.  Trachea midline. No JVD.  PULMONARY:  Distant breath sounds.  Coarse bilaterally , decreased breath sounds at bases.  No rales CARDIOVASCULAR: RRR, grade 2/6 systolic ejection murmur at the left upper sternal border. GASTROINTESTINAL: Soft, nontender, non-distended. No masses.  Normal bowel sounds. No hepatosplenomegaly.  MUSCULOSKELETAL: No cyanosis, clubbing, or edema.  Mild thoracolumbar tenderness unchanged from yesterday. NEUROLOGIC: More interactive today.  Very debilitated, wet sounding voice. SKIN: Multiple ecchymotic/pupuric areas, some vesicles with crusting.  This is a diffuse finding both on upper and lower extremities.  No purulent vesicle drainage.  Laboratory data reviewed.   ASSESSMENT/PLAN:  1.  Acute on chronic mixed respiratory failure: Multifactorial.  Patient has COPD which is advanced and end-stage.  This is aggravated by  diastolic dysfunction with pulmonary edema and hypoventilation due to need for opiates.  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  Prognosis overall is very poor. Continue noninvasive ventilation on an as-needed basis and nocturnally, pulmonary toilet and bronchodilators.  2.  Diastolic heart failure: Currently compensated.  Diuretics only as needed.  3.  Bibasilar atelectasis due to hypoventilation from multiple issues noted as above.  Main issue is splinting from pain and opiate requirement for pain control aggravated by restrictive physiology from thoracolumbar spine issues.  4. COPD GOLD class IV/D: Exacerbation due to issues noted above, continue BiPAP, pulmonary toilet and bronchodilators.  At present do not believe that she has pneumonia.  Discontinued inhalers as she does not have breath-holding capacity to utilize them.  Continue use of nebulizers.  5.  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  Coumadin has been discontinued.   All other offending medications have been held to include: Famotidine, atorvastatin and Plavix.  Amiodarone could also be a potential culprit however we will continue this for now as other medications are more common.  It would be ideal to get a skin biopsy. She may require colchicine and/or dapsone for treatment.  6.  Lumbar osteomyelitis: Continue antibiotics as determined by ID.  7.  History of DVT/PAF: Has been on anticoagulants however will withhold due to issues noted above.  8. DMM II: Continue sliding scale  9. EOL Discussion: Patient initially was leaning towards palliative care however she now desires full CODE STATUS after discussion with palliative care.  Palliative care will continue to engage the patient  in end-of-life discussion.  Appreciate their input.  To have video conference with the patient  and daughter tomorrow.  10.  Tobacco abuse and dependence due to cigarettes: This issue adds complexity to her management.  11.  Acute on chronic kidney injury: Avoid nephrotoxins.  Avoiding diuretics at present.  ANCA panel pending.  Nephrology has evaluated the patient, appreciate their input.  Urine for eosinophils pending.  Multidisciplinary rounds were performed with ICU team.  I discussed the case with palliative care.  I also discussed the care with Dr. Cherlynn KaiserSainani.  Discussed with Dr. Wynelle LinkKolluru.  The patient has multiple end-stage comorbidities and overall her prognosis is very poor.

## 2018-05-26 ENCOUNTER — Inpatient Hospital Stay: Payer: Medicare Other

## 2018-05-26 LAB — PHOSPHORUS: Phosphorus: 6.3 mg/dL — ABNORMAL HIGH (ref 2.5–4.6)

## 2018-05-26 LAB — CBC
HCT: 24.7 % — ABNORMAL LOW (ref 36.0–46.0)
Hemoglobin: 7.6 g/dL — ABNORMAL LOW (ref 12.0–15.0)
MCH: 26 pg (ref 26.0–34.0)
MCHC: 30.8 g/dL (ref 30.0–36.0)
MCV: 84.6 fL (ref 80.0–100.0)
Platelets: 386 10*3/uL (ref 150–400)
RBC: 2.92 MIL/uL — ABNORMAL LOW (ref 3.87–5.11)
RDW: 19.9 % — ABNORMAL HIGH (ref 11.5–15.5)
WBC: 6 10*3/uL (ref 4.0–10.5)
nRBC: 0 % (ref 0.0–0.2)

## 2018-05-26 LAB — EOSINOPHIL, URINE: Eosinophil, Urine: NONE SEEN %

## 2018-05-26 LAB — BASIC METABOLIC PANEL
Anion gap: 12 (ref 5–15)
BUN: 87 mg/dL — ABNORMAL HIGH (ref 6–20)
CO2: 24 mmol/L (ref 22–32)
Calcium: 8 mg/dL — ABNORMAL LOW (ref 8.9–10.3)
Chloride: 92 mmol/L — ABNORMAL LOW (ref 98–111)
Creatinine, Ser: 2.48 mg/dL — ABNORMAL HIGH (ref 0.44–1.00)
GFR calc Af Amer: 24 mL/min — ABNORMAL LOW (ref >60)
GFR calc non Af Amer: 21 mL/min — ABNORMAL LOW (ref >60)
Glucose, Bld: 172 mg/dL — ABNORMAL HIGH (ref 70–99)
Potassium: 5.1 mmol/L (ref 3.5–5.1)
Sodium: 128 mmol/L — ABNORMAL LOW (ref 135–145)

## 2018-05-26 LAB — PROTIME-INR
INR: 2.1 — ABNORMAL HIGH (ref 0.8–1.2)
Prothrombin Time: 23.4 seconds — ABNORMAL HIGH (ref 11.4–15.2)

## 2018-05-26 LAB — ANCA TITERS
Atypical P-ANCA titer: <1:20 {titer}
C-ANCA: <1:20 {titer}
P-ANCA: <1:20 {titer}

## 2018-05-26 LAB — GLUCOSE, CAPILLARY
Glucose-Capillary: 153 mg/dL — ABNORMAL HIGH (ref 70–99)
Glucose-Capillary: 143 mg/dL — ABNORMAL HIGH (ref 70–99)
Glucose-Capillary: 143 mg/dL — ABNORMAL HIGH (ref 70–99)
Glucose-Capillary: 145 mg/dL — ABNORMAL HIGH (ref 70–99)

## 2018-05-26 LAB — MAGNESIUM: Magnesium: 2.4 mg/dL (ref 1.7–2.4)

## 2018-05-26 LAB — VANCOMYCIN, RANDOM: Vancomycin Rm: 19

## 2018-05-26 MED ORDER — FUROSEMIDE 10 MG/ML IJ SOLN
80.0000 mg | Freq: Every day | INTRAMUSCULAR | Status: DC
  Administered 2018-05-26 – 2018-05-27 (×2): 80 mg via INTRAVENOUS
  Filled 2018-05-26 (×2): qty 8

## 2018-05-26 MED ORDER — PREDNISONE 10 MG PO TABS
30.0000 mg | ORAL_TABLET | Freq: Every day | ORAL | Status: DC
  Filled 2018-05-26: qty 3

## 2018-05-26 NOTE — Consult Note (Signed)
Pharmacy Antibiotic Note  Alexandra Goodman is a 58 y.o. female admitted on 2018-06-04 with COPD exacerbation. Patient was recently discharged with MSSA bacteremia and was receiving cefazolin via PICC at home with expected end date of approximately 06/04/18. Patient developed a rash initially attributed to cefazolin and was then switched to vancomycin to continue therapy. ID is following the patient.  Pharmacy has been consulted for Vancomycin dosing.  Plan: Random vancomycin level 19. Last dose of vancomycin 5/4 am. Per discussion with Dr. Rivka Safer, plan to start daptomycin when vancomycin level falls below 15. Will order level with morning labs and reassess for daptomycin tomorrow.  Goals of care meeting with family tomorrow.  Height: 5\' 3"  (160 cm) Weight: 174 lb 2.6 oz (79 kg) IBW/kg (Calculated) : 52.4  Temp (24hrs), Avg:98.1 F (36.7 C), Min:97.5 F (36.4 C), Max:98.9 F (37.2 C)  Recent Labs  Lab 05/21/18 0408  05/22/18 1230 05/23/18 0529 05/24/18 0428 05/24/18 1116 05/24/18 1440 05/25/18 0453 05/25/18 0455 05/26/18 0505 05/26/18 0510  WBC 5.2  --  5.6  --  5.7  --   --  5.1  --  6.0  --   CREATININE 0.86   < >  --  1.57* 1.99*  --  2.24* 2.23*  --  2.48*  --   VANCOTROUGH  --   --   --   --   --  30*  --   --   --   --   --   VANCORANDOM  --   --   --   --   --   --   --   --  24  --  19   < > = values in this interval not displayed.    Estimated Creatinine Clearance: 24.9 mL/min (A) (by C-G formula based on SCr of 2.48 mg/dL (H)).    Allergies  Allergen Reactions  . Skin Protectants, Misc. Rash  . Flagyl [Metronidazole]   . Gabapentin   . Levetiracetam   . Tape Other (See Comments)    blisters  . Ace Inhibitors Rash  . Cefazolin Rash    Rash developed on cefazolin while on long-term treatment (was on 3-4wks)  . Ertapenem Rash  . Sertraline Hcl Rash    Antimicrobials this admission: 4/27 Azithromycin x 1 4/27 Cefepime x 1 4/27 Levofloxacin >> 5/2 4/27  Vancomycin >>  Dose adjustments this admission: 4/28 Increase Vancomycin from 1250 mg q24 to 1750 mg q36 (starting 4/29) 4/30 Increase Vancomycin to 1500 mg IV q24h from 1750 mg IV q36h starting 5/1 5/1 Decrease Vancomycin from 1500 mg IV q24h to 1250 mg q24h.  5/3 Decrease Vancomycin from 1250 mg IV q24h to 1000 mg q24h. 5/5 Random vanc level 30 >> hold vancomycin 5/6 Random vanc level 24 >> hold vancomycin 5/7 Random vanc level 19 >> hold vancomycin  Microbiology results: 4/27 BCx: no growth  4/27 Sputum: few candida albicans  4/27 MRSA PCR: negative  4/27 UCx: no growth  4/27 COVID: negative   Thank you for allowing pharmacy to be a part of this patient's care.  Pricilla Riffle, PharmD Pharmacy Resident  05/26/2018 4:36 PM

## 2018-05-26 NOTE — Progress Notes (Signed)
Nutrition Follow-up  DOCUMENTATION CODES:   Not applicable  INTERVENTION:  Continue Ensure Max Protein po BID when patient off BiPAP, each supplement provides 150 kcal and 30 grams of protein.  NUTRITION DIAGNOSIS:   Increased nutrient needs related to chronic illness(COPD, CHF, wound healing ) as evidenced by estimated needs.  Ongoing.  GOAL:   Patient will meet greater than or equal to 90% of their needs  Ongoing.  MONITOR:   PO intake, Supplement acceptance, Labs, Weight trends, I & O's, Skin  REASON FOR ASSESSMENT:   Malnutrition Screening Tool    ASSESSMENT:   58 y.o. female with MSSA bacteremia, discitis, Acute resp failure/COPD, CHF, DM   Patient has been requiring BiPAP intermittently. She is currently on HFNC. Per chart plan is for video conference with PMT and family today to further discuss goals of care. Patient undergoing work-up for purpuric rash.  Medications reviewed and include: amiodarone, Colace 100 mg BID, Lasix 80 mg daily IV through 5/9, Novolog 0-15 units TID, Novolog 0-5 units QHS, Lantus 15 units daily, levothyroxine, Miralax, prednisone 30 mg daily. MVI and vitamin C have been discontinued.  Labs reviewed: CBG 153-331, Sodium 128, Chloride 92, BUN 87, Creatinine 2.48, Phosphorus 6.3.  Diet Order:   Diet Order            Diet Carb Modified Fluid consistency: Thin; Room service appropriate? Yes  Diet effective now             EDUCATION NEEDS:   No education needs have been identified at this time  Skin:  Skin Assessment: Reviewed RN Assessment(ecchymosis, Stage II buttocks, deep tissure injury hip, Stage II toe and heel, Stage II sacrum )  Last BM:  05/24/2018 - medium type 2  Height:   Ht Readings from Last 1 Encounters:  20-May-2018 5\' 3"  (1.6 m)   Weight:   Wt Readings from Last 1 Encounters:  05/26/18 79 kg   Ideal Body Weight:  52.3 kg  BMI:  Body mass index is 30.85 kg/m.  Estimated Nutritional Needs:   Kcal:   1600-1800kcal/day   Protein:  80-90g/day   Fluid:  >1.6L/day   Helane Rima, MS, RD, LDN Office: [phone] Pager: [phone] After Hours/Weekend Pager: [phone]

## 2018-05-26 NOTE — Progress Notes (Signed)
Daily Progress Note   Patient Name: Alexandra AlaminMary E Goodman       Date: 05/26/2018 DOB: 03/31/1960  Age: 58 y.o. MRN#: [id number] Attending Physician: Houston SirenSainani, Vivek J, MD Primary Care Physician: Floreen ComberKimel-Scott, Karen, MD Admit Date: 05/11/2018  Reason for Consultation/Follow-up: Establishing goals of care  Subjective: Patient is resting in bed on high flow cannula. She is coughing red blood. She has not eaten her lunch as she is SOB. She states this is not abnormal for her. She states she cannot have the conversation with her daughter today as she is too SOB. She discusses her poor quality of life at home. She states she does not want to suffer, but wants to be here for her children and grandchildren.   We discussed the progressiveness of her COPD. She states she would not want to be placed on a ventilator for distress and states "but you'd better keep me comfortable though!" . She states she would not want chest compressions, shocks, or a breathing tube, or medications if she is "on her way out" or for cardio/pulmonary arrest. Confirmation she had no questions and understands the decisions.  This conversation was witnessed by Dr. Belia HemanKasa.   She requested that I speak with her daughter Efraim KaufmannMelissa to arrange a e link meeting with her daughter Inetta Fermoina, her sisters and her boyfriend.   Spoke with her daughter. She states her mother has suffered a long time and she does not want her to continue to suffer. She states the family will miss her, but would be okay.   Plans for e link call at 12:00  Length of Stay: 10  Current Medications: Scheduled Meds:  . amiodarone  200 mg Oral BID  . aspirin EC  81 mg Oral Daily  . chlorhexidine  15 mL Mouth Rinse BID  . docusate sodium  100 mg Oral BID  . FLUoxetine  20 mg Oral  Daily  . fluticasone  1 spray Each Nare Daily  . furosemide  80 mg Intravenous Daily  . insulin aspart  0-15 Units Subcutaneous TID WC  . insulin aspart  0-5 Units Subcutaneous QHS  . insulin glargine  15 Units Subcutaneous Daily  . ipratropium-albuterol  3 mL Nebulization Q6H  . levothyroxine  88 mcg Oral Daily  . mouth rinse  15 mL Mouth Rinse q12n4p  .  mirabegron ER  25 mg Oral Daily  . nystatin  5 mL Oral QID  . polyethylene glycol  17 g Oral Daily  . [START ON 06/07/2018] predniSONE  30 mg Oral Q breakfast  . Ensure Max Protein  11 oz Oral BID  . sodium chloride flush  10-40 mL Intracatheter Q12H  . sodium zirconium cyclosilicate  10 g Oral Daily  . traZODone  150 mg Oral QHS  . vancomycin variable dose per unstable renal function (pharmacist dosing)   Does not apply See admin instructions    Continuous Infusions:   PRN Meds: albuterol, LORazepam, morphine injection, oxyCODONE-acetaminophen **AND** oxyCODONE, phenylephrine, sodium chloride flush  Physical Exam Pulmonary:     Comments: High flow cannula Skin:    Findings: Rash present.  Neurological:     Mental Status: She is alert.             Vital Signs: BP (!) 112/56 (BP Location: Right Arm)   Pulse 88   Temp (!) 97.5 F (36.4 C) (Axillary)   Resp 13   Ht  (1.6 m)   Wt 79 kg   SpO2 94%   BMI 30.85 kg/m  SpO2: SpO2: 94 % O2 Device: O2 Device: High Flow Nasal Cannula O2 Flow Rate: O2 Flow Rate (L/min): 50 L/min  Intake/output summary:   Intake/Output Summary (Last 24 hours) at 05/26/2018 1342 Last data filed at 05/26/2018 1200 Gross per 24 hour  Intake 237 ml  Output 545 ml  Net -308 ml   LBM: Last BM Date: 05/24/18 Baseline Weight: Weight: 66 kg Most recent weight: Weight: 79 kg       Palliative Assessment/Data:    Flowsheet Rows     Most Recent Value  Intake Tab  Referral Department  Critical care  Unit at Time of Referral  ICU  Palliative Care Primary Diagnosis  Pulmonary  Date  Notified  05/23/18  Palliative Care Type  New Palliative care  Reason for referral  Clarify Goals of Care  Date of Admission  11-Jun-2018  # of days IP prior to Palliative referral  7  Clinical Assessment  Psychosocial & Spiritual Assessment  Palliative Care Outcomes      Patient Active Problem List   Diagnosis Date Noted  . Acute exacerbation of chronic obstructive pulmonary disease (COPD) (HCC) 06-11-18  . Pneumonia 06-11-2018  . Bacteremia due to Staphylococcus aureus 05/13/2018  . Vertebral osteomyelitis (HCC) 05/13/2018  . Anemia 05/13/2018  . Pressure injury of skin 04/22/2018  . Sepsis (HCC) 04/20/2018    Palliative Care Assessment & Plan   Recommendations/Plan:  E link conversation with entire family tomorrow at 12:00.   Now DNR/DNI  Code Status:    Code Status Orders  (From admission, onward)         Start     Ordered   06-11-2018 0703  Full code  Continuous     2018/06/11 0706        Code Status History    Date Active Date Inactive Code Status Order ID Comments User Context   06-11-2018 0437 06-11-2018 0706 Full Code [id number]  Loleta Rose, MD ED   04/20/2018 1926 04/27/2018 1843 Full Code [id number]  Judithe Modest, NP Inpatient   04/20/2018 1903 04/20/2018 1926 Full Code [id number]  Houston Siren, MD Inpatient    Advance Directive Documentation     Most Recent Value  Type of Advance Directive  Living will  Pre-existing out of facility DNR order (yellow form or pink  MOST form)  -  "MOST" Form in Place?  -       Prognosis:  Poor overall   Care plan was discussed with RN  Thank you for allowing the Palliative Medicine Team to assist in the care of this patient.   Total Time 12:50- 2:00 70 min Prolonged Time Billed yes      Greater than 50%  of this time was spent counseling and coordinating care related to the above assessment and plan.  Morton Stall, NP  Please contact Palliative Medicine Team phone at [phone] for questions and  concerns.

## 2018-05-26 NOTE — Progress Notes (Signed)
[facility]  ROUNDING NOTE   Subjective:   Oliguric urine output.   Placed on HFNC  Objective:  Vital signs in last 24 hours:  Temp:  [97.5 F (36.4 C)-98.9 F (37.2 C)] 97.5 F (36.4 C) (05/07 0800) Pulse Rate:  [72-100] 85 (05/07 1000) Resp:  [10-22] 18 (05/07 1000) BP: (88-120)/(46-87) 102/56 (05/07 1000) SpO2:  [80 %-100 %] 96 % (05/07 1119) FiO2 (%):  [50 %-100 %] 80 % (05/07 1119) Weight:  [79 kg] 79 kg (05/07 0500)  Weight change:  Filed Weights   05/21/18 0418 05/22/18 0500 05/26/18 0500  Weight: 70.2 kg 72.2 kg 79 kg    Intake/Output: I/O last 3 completed shifts: In: 80 [P.O.:60; I.V.:20] Out: 715 [Urine:715]   Intake/Output this shift:  Total I/O In: 237 [P.O.:237] Out: 65 [Urine:65]  Physical Exam: General: NAD,   Head: Normocephalic, atraumatic. Moist oral mucosal membranes  Eyes: Anicteric, PERRL  Neck: Supple, trachea midline  Lungs:  Bilateral crackles  Heart: Regular rate and rhythm  Abdomen:  Soft, nontender,   Extremities: no peripheral edema.  Neurologic: Nonfocal, moving all four extremities  Skin: No lesions  Access:      Basic Metabolic Panel: Recent Labs  Lab 05/23/18 0529 05/24/18 0428 05/24/18 1440 05/24/18 1943 05/25/18 0453 05/26/18 0505  NA 135 131* 132*  --  133* 128*  K 4.5 5.2* 5.3* 5.4* 5.4* 5.1  CL 98 96* 95*  --  98 92*  CO2 --  27 24  GLUCOSE 188* 274* 178*  --  128* 172*  BUN 45* 60* 65*  --  71* 87*  CREATININE 1.57* 1.99* 2.24*  --  2.23* 2.48*  CALCIUM 8.0* 8.1* 8.3*  --  8.0* 8.0*  MG 2.0  --   --   --   --  2.4  PHOS 4.7*  --   --   --   --  6.3*    Liver Function Tests: No results for input(s): AST, ALT, ALKPHOS, BILITOT, PROT, ALBUMIN in the last 168 hours. No results for input(s): LIPASE, AMYLASE in the last 168 hours. No results for input(s): AMMONIA in the last 168 hours.  CBC: Recent Labs  Lab 05/21/18 0408 05/22/18 1230 05/24/18 0428 05/25/18 0453 05/26/18 0505   WBC 5.2 5.6 5.7 5.1 6.0  NEUTROABS  --  4.6  --  3.7  --   HGB 7.5* 8.1* 7.5* 8.2* 7.6*  HCT 24.8* 27.3* 25.1* 27.7* 24.7*  MCV 88.3 88.6 87.8 87.7 84.6  PLT 336 353 325 369 386    Cardiac Enzymes: No results for input(s): CKTOTAL, CKMB, CKMBINDEX, TROPONINI in the last 168 hours.  BNP: Invalid input(s): POCBNP  CBG: Recent Labs  Lab 05/25/18 0743 05/25/18 1117 05/25/18 1639 05/25/18 2149 05/26/18 0743  GLUCAP 143* 138* 331* 223* 153*    Microbiology: Results for orders placed or performed during the hospital encounter of 05/12/2018  SARS Coronavirus 2 [facility] order, Performed in [facility] hospital lab)     Status: None   Collection Time: 05/02/2018  4:28 AM  Result Value Ref Range Status   SARS Coronavirus 2 NEGATIVE NEGATIVE Final    Comment: (NOTE) If result is NEGATIVE SARS-CoV-2 target nucleic acids are NOT DETECTED. The SARS-CoV-2 RNA is generally detectable in upper and lower  respiratory specimens during the acute phase of infection. The lowest  concentration of SARS-CoV-2 viral copies this assay can detect is 250  copies / mL. A negative result does not  preclude SARS-CoV-2 infection  and should not be used as the sole basis for treatment or other  patient management decisions.  A negative result may occur with  improper specimen collection / handling, submission of specimen other  than nasopharyngeal swab, presence of viral mutation(s) within the  areas targeted by this assay, and inadequate number of viral copies  (<250 copies / mL). A negative result must be combined with clinical  observations, patient history, and epidemiological information. If result is POSITIVE SARS-CoV-2 target nucleic acids are DETECTED. The SARS-CoV-2 RNA is generally detectable in upper and lower  respiratory specimens dur ing the acute phase of infection.  Positive  results are indicative of active infection with SARS-CoV-2.  Clinical  correlation with patient history and  other diagnostic information is  necessary to determine patient infection status.  Positive results do  not rule out bacterial infection or co-infection with other viruses. If result is PRESUMPTIVE POSTIVE SARS-CoV-2 nucleic acids MAY BE PRESENT.   A presumptive positive result was obtained on the submitted specimen  and confirmed on repeat testing.  While 2019 novel coronavirus  (SARS-CoV-2) nucleic acids may be present in the submitted sample  additional confirmatory testing may be necessary for epidemiological  and / or clinical management purposes  to differentiate between  SARS-CoV-2 and other Sarbecovirus currently known to infect humans.  If clinically indicated additional testing with an alternate test  methodology [phone]) is advised. The SARS-CoV-2 RNA is generally  detectable in upper and lower respiratory sp ecimens during the acute  phase of infection. The expected result is Negative. Fact Sheet for Patients:  BoilerBrush.com.cy Fact Sheet for Healthcare Providers: https://pope.com/ This test is not yet approved or cleared by the [place] FDA and has been authorized for detection and/or diagnosis of SARS-CoV-2 by FDA under an Emergency Use Authorization (EUA).  This EUA will remain in effect (meaning this test can be used) for the duration of the COVID-19 declaration under Section 564(b)(1) of the Act, 21 U.S.C. section 360bbb-3(b)(1), unless the authorization is terminated or revoked sooner. Performed at [facility], [address] [place], [place] [address]   Blood Culture (routine x 2)     Status: None   Collection Time: 2018-05-31  4:51 AM  Result Value Ref Range Status   Specimen Description BLOOD RIGHT FOREARM  Final   Special Requests   Final    BOTTLES DRAWN AEROBIC AND ANAEROBIC Blood Culture results may not be optimal due to an excessive volume of blood received in culture bottles   Culture    Final    NO GROWTH 5 DAYS Performed at [facility], [address] [place], [place] [address]    Report Status 05/21/2018 FINAL  Final  Blood Culture (routine x 2)     Status: None   Collection Time: 05/31/18  4:51 AM  Result Value Ref Range Status   Specimen Description BLOOD RIGHT HAND  Final   Special Requests   Final    BOTTLES DRAWN AEROBIC AND ANAEROBIC Blood Culture results may not be optimal due to an inadequate volume of blood received in culture bottles   Culture   Final    NO GROWTH 5 DAYS Performed at [facility], [address] [place], [place] [address]    Report Status 05/21/2018 FINAL  Final  MRSA PCR Screening     Status: None   Collection Time: 2018-05-31  5:53 PM  Result Value Ref Range Status   MRSA by PCR  NEGATIVE NEGATIVE Final    Comment:        The GeneXpert MRSA Assay (FDA approved for NASAL specimens only), is one component of a comprehensive MRSA colonization surveillance program. It is not intended to diagnose MRSA infection nor to guide or monitor treatment for MRSA infections. Performed at [facility], [address] [place], [place] [address]   Culture, sputum-assessment     Status: None   Collection Time: 05/06/2018  8:00 PM  Result Value Ref Range Status   Specimen Description SPUTUM  Final   Special Requests NONE  Final   Sputum evaluation   Final    THIS SPECIMEN IS ACCEPTABLE FOR SPUTUM CULTURE Performed at [facility], [address], [place], [place] [address]    Report Status 04/20/2018 FINAL  Final  Culture, respiratory     Status: None   Collection Time: 05/15/2018  8:00 PM  Result Value Ref Range Status   Specimen Description   Final    SPUTUM Performed at [facility], [address] [place], [place] [address]    Special Requests   Final    NONE Reflexed from [id number] Performed at [facility], [address] [place], [place] [address]    Gram Stain   Final     FEW SQUAMOUS EPITHELIAL CELLS PRESENT NO WBC SEEN FEW YEAST Performed at [facility], [address], [place], [place] [address]    Culture   Final    ABUNDANT CANDIDA ALBICANS FEW CANDIDA TROPICALIS    Report Status 05/19/2018 FINAL  Final  Urine Culture     Status: None   Collection Time: 05/17/2018  9:10 PM  Result Value Ref Range Status   Specimen Description   Final    URINE, CLEAN CATCH Performed at [facility], [address], [place], [place] [address]    Special Requests   Final    Normal Performed at [facility], [address] [place], [place] [address]    Culture   Final    NO GROWTH Performed at [facility], [address] [place], [place] [address]    Report Status 05/18/2018 FINAL  Final    Coagulation Studies: Recent Labs    05/24/18 0428 05/25/18 0453 05/26/18 0505  LABPROT 31.5* 36.7* 23.4*  INR 3.1* 3.8* 2.1*    Urinalysis: No results for input(s): COLORURINE, LABSPEC, PHURINE, GLUCOSEU, HGBUR, BILIRUBINUR, KETONESUR, PROTEINUR, UROBILINOGEN, NITRITE, LEUKOCYTESUR in the last 72 hours.  Invalid input(s): APPERANCEUR    Imaging: No results found.   Medications:    . amiodarone  200 mg Oral BID  . aspirin EC  81 mg Oral Daily  . chlorhexidine  15 mL Mouth Rinse BID  . docusate sodium  100 mg Oral BID  . FLUoxetine  20 mg Oral Daily  . fluticasone  1 spray Each Nare Daily  . furosemide  80 mg Intravenous Daily  . insulin aspart  0-15 Units Subcutaneous TID WC  . insulin aspart  0-5 Units Subcutaneous QHS  . insulin glargine  15 Units Subcutaneous Daily  . ipratropium-albuterol  3 mL Nebulization Q6H  . levothyroxine  88 mcg Oral Daily  . mouth rinse  15 mL Mouth Rinse q12n4p  . mirabegron ER  25 mg Oral Daily  . nystatin  5 mL Oral QID  . polyethylene glycol  17 g Oral Daily  . [START ON 05/31/18] predniSONE  30 mg Oral Q breakfast  . Ensure Max  Protein  11 oz Oral BID  . sodium chloride flush   10-40 mL Intracatheter Q12H  . sodium zirconium cyclosilicate  10 g Oral Daily  . traZODone  150 mg Oral QHS  . vancomycin variable dose per unstable renal function (pharmacist dosing)   Does not apply See admin instructions   albuterol, LORazepam, morphine injection, oxyCODONE-acetaminophen **AND** oxyCODONE, phenylephrine, sodium chloride flush  Assessment/ Plan:  Alexandra Goodman is a 58 y.o. white female with COPD, CVA, PVD, diabetes mellitus type II, hypertension, congestive heart failure, left BKA who was [facility] 04[facility]2018 for acute exacerbation of COPD, acute CHF exacerbation, and MSSA bacteremia.   1. Acute renal failure on chronic kidney disease stage III with proteinuria: baseline creatinine of 1.14, GFR of 53 on 04/26/2018 2. Hyponatremia 3. Hyperkalemia 4. Diabetes mellitus type II with chronic kidney disease: insulin dependent.   Plan  No IV contrast exposure. There is a possibility of vasculitis or AIN. ANCA titers sent yesterday. Urine eos sent today.  - IV furosemide - Monitor volume status, low threshold for dialysis for ultrafiltration    LOS: 10 Luberta Grabinski 5/7/202011:20 AM

## 2018-05-26 NOTE — TOC Progression Note (Signed)
Transition of Care [facility]) - Progression Note    Patient Details  Name: Alexandra Goodman MRN: [id number] Date of Birth: Sep 02, 1960  Transition of Care [facility]) CM/SW Contact  Barrie Dunker, RN Phone Number: 05/26/2018, 1:59 PM  Clinical Narrative:      Patient was able to speak with her family and now is a DNR.  She has been taken off the Bipap but is on high flow, Palliative is to see the patient and possible Hospice.  There was to be a palliative family meeting today.  The patient comes from home and was having IV ABX at home thru PICC line to end on 05/16 for MSSA Bacteremia She has a poor prognosis and the family is aware. She had Stage 4 COPD in 2015, with 80 mg of lasix only had 20 cc of urine output this morning CM will continue to Monitor for any needs      Expected Discharge Plan and Services                                                 Social Determinants of Health (SDOH) Interventions    Readmission Risk Interventions Readmission Risk Prevention Plan 04/25/2018  Transportation Screening Complete  PCP or Specialist Appt within 3-5 Days Complete  HRI or Home Care Consult Complete  Social Work Consult for Recovery Care Planning/Counseling Patient refused  Palliative Care Screening Not Applicable  Medication Review Oceanographer) Complete  Some recent data might be hidden

## 2018-05-26 NOTE — Progress Notes (Signed)
[employer] [facility] [place] [facility]     PATIENT NAME: Alexandra PippinsMary Messerschmidt    MR#:  [id number]  DATE OF BIRTH:  02/22/1960  SUBJECTIVE:   Patient remains on high flow nasal cannula and has some work of breathing.  Status post skin biopsy yesterday.  Plan for a family meeting later this afternoon.  REVIEW OF SYSTEMS:    Review of Systems  Constitutional: Negative for chills and fever.  HENT: Negative for congestion and tinnitus.   Eyes: Negative for blurred vision and double vision.  Respiratory: Positive for shortness of breath. Negative for cough and wheezing.   Cardiovascular: Negative for chest pain, orthopnea and PND.  Gastrointestinal: Negative for abdominal pain, constipation, diarrhea, nausea and vomiting.  Genitourinary: Negative for dysuria and hematuria.  Skin: Positive for rash.  Neurological: Positive for weakness (generalized. ). Negative for dizziness, sensory change and focal weakness.  All other systems reviewed and are negative.   Nutrition: Carb control Tolerating Diet: Yes Tolerating PT: Await Eval.   DRUG ALLERGIES:   Allergies  Allergen Reactions  . Skin Protectants, Misc. Rash  . Flagyl [Metronidazole]   . Gabapentin   . Levetiracetam   . Tape Other (See Comments)    blisters  . Ace Inhibitors Rash  . Cefazolin Rash    Rash developed on cefazolin while on long-term treatment (was on 3-4wks)  . Ertapenem Rash  . Sertraline Hcl Rash    VITALS:  Blood pressure 100/73, pulse 89, temperature (!) 97.5 F (36.4 C), temperature source Axillary, resp. rate 14, height 5\' 3"  (1.6 m), weight 79 kg, SpO2 (!) 85 %.  PHYSICAL EXAMINATION:   Physical Exam  GENERAL:  58 y.o.-year-old patient sitting up in chair in mild Resp. Distress.   EYES: Pupils equal, round, reactive to light and accommodation. No scleral icterus. Extraocular muscles intact.  HEENT: Head atraumatic, normocephalic. Oropharynx and nasopharynx clear.  NECK:  Supple, no  jugular venous distention. No thyroid enlargement, no tenderness.  LUNGS: Normal breath sounds bilaterally, no wheezing, rales, rhonchi. + use of accessory muscles of respiration.  CARDIOVASCULAR: S1, S2 normal. No murmurs, rubs, or gallops.  ABDOMEN: Soft, nontender, nondistended. Bowel sounds present. No organomegaly or mass.  EXTREMITIES: No cyanosis, clubbing or edema b/l.   Left sided BKA NEUROLOGIC: Cranial nerves II through XII are intact. No focal Motor or sensory deficits b/l.  Globally weak. PSYCHIATRIC: The patient is alert and oriented x 3.  SKIN: No obvious rash, lesion, or ulcer.  Patient has a purpuric rash on her arms and legs as seen below          LABORATORY PANEL:   CBC Recent Labs  Lab 05/26/18 0505  WBC 6.0  HGB 7.6*  HCT 24.7*  PLT 386   ------------------------------------------------------------------------------------------------------------------  Chemistries  Recent Labs  Lab 05/26/18 0505  NA 128*  K 5.1  CL 92*  CO2 24  GLUCOSE 172*  BUN 87*  CREATININE 2.48*  CALCIUM 8.0*  MG 2.4   ------------------------------------------------------------------------------------------------------------------  Cardiac Enzymes No results for input(s): TROPONINI in the last 168 hours. ------------------------------------------------------------------------------------------------------------------  RADIOLOGY:  Dg Chest Port 1 View  Result Date: 05/26/2018 CLINICAL DATA:  Respiratory distress EXAM: PORTABLE CHEST 1 VIEW COMPARISON:  05/20/2018 FINDINGS: 1133 hours. Diffuse interstitial and patchy airspace disease again identified with a basilar predominance. Confluent retrocardiac left base collapse/consolidation is similar. Small to moderate bilateral pleural effusions evident. Left PICC line tip overlies the distal SVC near the RA junction. The visualized bony structures of  the thorax are intact. Telemetry leads overlie the chest. IMPRESSION:  Persistent bilateral interstitial and airspace disease with some progression of confluent airspace opacity in the right mid lung. Bibasilar collapse/consolidation with stable appearance of bilateral effusions. Electronically Signed   By: Kennith Center M.D.   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  1.  Acute on chronic respiratory failure with hypoxia- secondary to underlying COPD and also CHF. -Continue BiPAP support, duo nebs prednisone.  Currently on high flow nasal cannula.  Wean oxygen as tolerated as per pulmonary.  2.  Acute on chronic diastolic congestive heart failure-Improved with diuresis.  Creatinine was starting to rise and therefore Lasix discontinued  - continues to require intermitted Bipap.    3.  History of discitis with MSSA bacteremia- continue vancomycin for now.  Patient was on Ancef but developed a rash and therefore has been switched to vancomycin.   - appreciate ID input and cont. Vanc until 06/04/18 and outpatient follow with Neurosurgery.   4.  Rash-patient has purpuric rash on her upper and lower extremities -Initially thought to be secondary to Ancef but after seen by ID they think this could be a warfarin induced purpura/?? Vasculitis  - s/p biopsy yesterday and await results.   -Coumadin has been stopped presently.  She is also off Ancef  5.  Hx of atrial fibrillation-rate controlled.  Continue amiodarone.   - hold coumadin due to above.   6.  COPD- She has bad end-stage COPD. -   Continue, prednisone, scheduled duonebs.  Prognosis is poor.   7.  Hypothyroidism-continue Synthroid.  8.  Diabetes type 2 without complication- continue lantus, sliding scale insulin.  Blood sugar stable.  9.   Hyperlipidemia-continue atorvastatin.  10.  Anxiety-continue Xanax.  Her prognosis is poor given her end-stage COPD and multiple comorbidities.  Patient had palliative care meeting with patient's daughter remotely today.  After those discussions patient is now a DNR.  All the records are reviewed and case discussed with Care Management/Social Worker. Management plans discussed with the patient, family and they are in agreement.  CODE STATUS: DNR  DVT Prophylaxis: Ted's & SCD's.   TOTAL TIME TAKING CARE OF THIS PATIENT: 30 minutes.   POSSIBLE D/C IN unclear, DEPENDING ON CLINICAL CONDITION and progress   Houston Siren M.D on 05/26/2018 at 2:54 PM  Between 7am to 6pm - Pager - [phone]  After 6pm go to [email or web address] - Social research officer, government  [facility] [place] [facility]  Office  [phone]  CC: Primary care physician; Floreen Comber, MD

## 2018-05-26 NOTE — Progress Notes (Signed)
Clinical status relayed to patient,  Updated and notified of patient of her medical condition-  Progressive multiorgan failure with very low chance of meaningful recovery.  Patient does not want to suffer She does not want CPR or VENT to keep her alove Patient is alert and awake following commands Patient states that she has been in contact with daughter.   Patient has  consented and agreed to DNR/DNI.   Patient satisfied with Plan of action and management. All questions answered  Lucie Leather, M.D.  [facility]  Medical Director [facility] Medical Director [facility] Cardio-Pulmonary Department

## 2018-05-26 NOTE — Consult Note (Signed)
ANTICOAGULATION CONSULT NOTE  Pharmacy Consult for Apixaban  Indication: Previous DVT  Allergies  Allergen Reactions  . Skin Protectants, Misc. Rash  . Flagyl [Metronidazole]   . Gabapentin   . Levetiracetam   . Tape Other (See Comments)    blisters  . Ace Inhibitors Rash  . Cefazolin Rash    Rash developed on cefazolin while on long-term treatment (was on 3-4wks)  . Ertapenem Rash  . Sertraline Hcl Rash    Patient Measurements: Height: 5\' 3"  (160 cm) Weight: 174 lb 2.6 oz (79 kg) IBW/kg (Calculated) : 52.4  Vital Signs: Temp: 97.5 F (36.4 C) (05/07 1200) Temp Source: Axillary (05/07 1200) BP: 100/73 (05/07 1400) Pulse Rate: 89 (05/07 1400)  Labs: Recent Labs    05/24/18 0428 05/24/18 1440 05/25/18 0453 05/26/18 0505  HGB 7.5*  --  8.2* 7.6*  HCT 25.1*  --  27.7* 24.7*  PLT 325  --  369 386  LABPROT 31.5*  --  36.7* 23.4*  INR 3.1*  --  3.8* 2.1*  CREATININE 1.99* 2.24* 2.23* 2.48*    Estimated Creatinine Clearance: 24.9 mL/min (A) (by C-G formula based on SCr of 2.48 mg/dL (H)).   Medical History: Past Medical History:  Diagnosis Date  . Asthma   . CHF (congestive heart failure) (HCC)   . Chronic pain   . COPD (chronic obstructive pulmonary disease) (HCC)   . Diabetes mellitus (HCC)   . Peripheral vascular disease (HCC)   . 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 Patient takes amiodarone 200 mg PO BID PTA (continued inpatient).  Assessment: 58 y.o. female admitted on 05/20/2018 with COPD exacerbation. Patient was recently discharged with pneumonia and received cefazolin via PICC at home. Patient antibiotic regimen changed to vancomycin.   Goal of Therapy:  Monitor platelets by anticoagulation protocol: Yes   Plan:  INR 2.1. Patient with possible warfarin induced purpura/necrosis. Plan to initiate apixaban when INR < 2.   Pharmacy will continue to monitor  and adjust per consult.   Ashutosh Dieguez L,  05/26/2018 2:38 PM

## 2018-05-26 NOTE — Progress Notes (Signed)
ID Seen by dermatologist yesterday and skin biopsy done- spoke to [facility]- result of the biopsy will be obtained by [facility]

## 2018-05-27 LAB — CBC
HCT: 21.2 % — ABNORMAL LOW (ref 36.0–46.0)
Hemoglobin: 6.3 g/dL — ABNORMAL LOW (ref 12.0–15.0)
MCH: 25.8 pg — ABNORMAL LOW (ref 26.0–34.0)
MCHC: 29.7 g/dL — ABNORMAL LOW (ref 30.0–36.0)
MCV: 86.9 fL (ref 80.0–100.0)
Platelets: 305 10*3/uL (ref 150–400)
RBC: 2.44 MIL/uL — ABNORMAL LOW (ref 3.87–5.11)
RDW: 19.8 % — ABNORMAL HIGH (ref 11.5–15.5)
WBC: 6.6 10*3/uL (ref 4.0–10.5)
nRBC: 0 % (ref 0.0–0.2)

## 2018-05-27 LAB — COMPREHENSIVE METABOLIC PANEL
ALT: 8 U/L (ref 0–44)
AST: 16 U/L (ref 15–41)
Albumin: 2.2 g/dL — ABNORMAL LOW (ref 3.5–5.0)
Alkaline Phosphatase: 79 U/L (ref 38–126)
Anion gap: 12 (ref 5–15)
BUN: 95 mg/dL — ABNORMAL HIGH (ref 6–20)
CO2: 24 mmol/L (ref 22–32)
Calcium: 8.2 mg/dL — ABNORMAL LOW (ref 8.9–10.3)
Chloride: 94 mmol/L — ABNORMAL LOW (ref 98–111)
Creatinine, Ser: 2.73 mg/dL — ABNORMAL HIGH (ref 0.44–1.00)
GFR calc Af Amer: 22 mL/min — ABNORMAL LOW (ref >60)
GFR calc non Af Amer: 19 mL/min — ABNORMAL LOW (ref >60)
Glucose, Bld: 111 mg/dL — ABNORMAL HIGH (ref 70–99)
Potassium: 5.7 mmol/L — ABNORMAL HIGH (ref 3.5–5.1)
Sodium: 130 mmol/L — ABNORMAL LOW (ref 135–145)
Total Bilirubin: 0.8 mg/dL (ref 0.3–1.2)
Total Protein: 5.2 g/dL — ABNORMAL LOW (ref 6.5–8.1)

## 2018-05-27 LAB — VANCOMYCIN, RANDOM: Vancomycin Rm: 18

## 2018-05-27 LAB — GLUCOSE, CAPILLARY
Glucose-Capillary: 123 mg/dL — ABNORMAL HIGH (ref 70–99)
Glucose-Capillary: 94 mg/dL (ref 70–99)

## 2018-05-27 LAB — PROTIME-INR
INR: 1.9 — ABNORMAL HIGH (ref 0.8–1.2)
Prothrombin Time: 21.3 seconds — ABNORMAL HIGH (ref 11.4–15.2)

## 2018-05-27 LAB — MAGNESIUM: Magnesium: 2.4 mg/dL (ref 1.7–2.4)

## 2018-05-27 MED ORDER — GLYCOPYRROLATE 0.2 MG/ML IJ SOLN
0.2000 mg | INTRAMUSCULAR | Status: DC | PRN
  Filled 2018-05-27: qty 1

## 2018-05-27 MED ORDER — FUROSEMIDE 10 MG/ML IJ SOLN: 80.0000 mg | Freq: Three times a day (TID) | INTRAMUSCULAR | Status: DC

## 2018-05-27 MED ORDER — MORPHINE BOLUS VIA INFUSION
1.0000 mg | INTRAVENOUS | Status: DC | PRN
  Administered 2018-05-27: 2 mg via INTRAVENOUS
  Filled 2018-05-27: qty 2

## 2018-05-27 MED ORDER — MORPHINE 100MG IN NS 100ML (1MG/ML) PREMIX INFUSION
1.0000 mg/h | INTRAVENOUS | Status: DC
  Administered 2018-05-27: 1 mg/h via INTRAVENOUS
  Filled 2018-05-27: qty 100

## 2018-05-27 MED ORDER — LORAZEPAM 2 MG/ML IJ SOLN
1.0000 mg | INTRAMUSCULAR | Status: DC | PRN
  Administered 2018-05-27: 20:00:00 2 mg via INTRAVENOUS
  Filled 2018-05-27: qty 1

## 2018-05-27 MED ORDER — ALBUMIN HUMAN 25 % IV SOLN
25.0000 g | Freq: Three times a day (TID) | INTRAVENOUS | Status: DC
  Filled 2018-05-27 (×2): qty 100

## 2018-05-27 MED ORDER — LORAZEPAM 2 MG/ML IJ SOLN: 1.0000 mg | INTRAMUSCULAR | Status: DC | PRN

## 2018-05-30 LAB — BLOOD GAS, ARTERIAL
Acid-Base Excess: 1.9 mmol/L (ref 0.0–2.0)
Bicarbonate: 28.2 mmol/L — ABNORMAL HIGH (ref 20.0–28.0)
FIO2: 0.5
O2 Saturation: 89 %
Patient temperature: 37
pCO2 arterial: 50 mmHg — ABNORMAL HIGH (ref 32.0–48.0)
pH, Arterial: 7.36 (ref 7.350–7.450)
pO2, Arterial: 59 mmHg — ABNORMAL LOW (ref 83.0–108.0)

## 2018-05-31 ENCOUNTER — Telehealth: Payer: Self-pay | Admitting: Pulmonary Disease

## 2018-05-31 NOTE — Telephone Encounter (Signed)
Death Certificate received, placed in DS box for completion. 

## 2018-06-02 ENCOUNTER — Telehealth: Payer: Medicare Other | Admitting: Infectious Diseases

## 2018-06-02 NOTE — Telephone Encounter (Signed)
Death Certificate complete. Alexandra Goodman with [employer] home aware ready for pickup.

## 2018-06-20 NOTE — Progress Notes (Addendum)
Daily Progress Note   Patient Name: Alexandra Goodman       Date: 06/12/2018 DOB: 15-Jan-1961  Age: 58 y.o. MRN#: [id number] Attending Physician: Houston Siren, MD Primary Care Physician: Floreen Comber, MD Admit Date: 2018-05-24  Reason for Consultation/Follow-up: Establishing goals of care  Subjective: Patient is resting in bed back on BIPAP. She feels SOB. Her rash is weeping blood. She states she is ready to speak to her family. E link call with her family including children, siblings, and boyfriend.    We discussed her diagnoses, prognosis, GOC, EOL wishes disposition and options.    The difference between an aggressive medical intervention path and a comfort care path was discussed.  Values and goals of care important to patient and family were attempted to be elicited.  Discussed limitations of medical interventions to prolong quality of life for this patient at this time in this situation and discussed the concept of human mortality.  Patient relates her wishes for comfort care starting today to her family. The family is understanding of this and state they understand she has suffered for years, and will be going to heaven to be with the Shaune Pollack and other family members. CCM NP called to verify visitor restrictions at death. Current restrictions relayed to the family.  Several said their goodbyes as we discussed current visitor restrictions.   Plans for family members to come to bedside with transition to comfort care. Anticipated hospital death.  CCM updated.   Addendum 3:40- Family members at bedside visiting. Several other family members on E link. Patient on BIPAP. She will let staff know when she is ready to initiate comfort care after spending time with them.   Length of Stay: 11   Current Medications: Scheduled Meds:  . amiodarone  200 mg Oral BID  . aspirin EC  81 mg Oral Daily  . chlorhexidine  15 mL Mouth Rinse BID  . docusate sodium  100 mg Oral BID  . FLUoxetine  20 mg Oral Daily  . fluticasone  1 spray Each Nare Daily  . furosemide  80 mg Intravenous Q8H  . insulin aspart  0-15 Units Subcutaneous TID WC  . insulin aspart  0-5 Units Subcutaneous QHS  . insulin glargine  15 Units Subcutaneous Daily  . ipratropium-albuterol  3 mL Nebulization Q6H  .  levothyroxine  88 mcg Oral Daily  . mouth rinse  15 mL Mouth Rinse q12n4p  . mirabegron ER  25 mg Oral Daily  . nystatin  5 mL Oral QID  . polyethylene glycol  17 g Oral Daily  . predniSONE  30 mg Oral Q breakfast  . Ensure Max Protein  11 oz Oral BID  . sodium chloride flush  10-40 mL Intracatheter Q12H  . sodium zirconium cyclosilicate  10 g Oral Daily  . traZODone  150 mg Oral QHS  . vancomycin variable dose per unstable renal function (pharmacist dosing)   Does not apply See admin instructions    Continuous Infusions: . albumin human      PRN Meds: albuterol, LORazepam, morphine injection, oxyCODONE-acetaminophen **AND** oxyCODONE, phenylephrine, sodium chloride flush  Physical Exam Pulmonary:     Comments: BIPAP Skin:    General: Skin is warm.     Findings: Rash present.  Neurological:     Mental Status: She is alert.             Vital Signs: BP (!) 94/54   Pulse 85   Temp 97.8 F (36.6 C) (Other (Comment))   Resp 16   Ht 5\' 3"  (1.6 m)   Wt 74.8 kg   SpO2 92%   BMI 29.21 kg/m  SpO2: SpO2: 92 % O2 Device: O2 Device: Bi-PAP O2 Flow Rate: O2 Flow Rate (L/min): 50 L/min  Intake/output summary:   Intake/Output Summary (Last 24 hours) at 06/19/2018 1304 Last data filed at 06/15/2018 0800 Gross per 24 hour  Intake 100 ml  Output 380 ml  Net -280 ml   LBM: Last BM Date: 05/24/18 Baseline Weight: Weight: 66 kg Most recent weight: Weight: 74.8 kg       Palliative Assessment/Data:     Flowsheet Rows     Most Recent Value  Intake Tab  Referral Department  Critical care  Unit at Time of Referral  ICU  Palliative Care Primary Diagnosis  Pulmonary  Date Notified  05/23/18  Palliative Care Type  New Palliative care  Reason for referral  Clarify Goals of Care  Date of Admission  2018/10/27  # of days IP prior to Palliative referral  7  Clinical Assessment  Psychosocial & Spiritual Assessment  Palliative Care Outcomes      Patient Active Problem List   Diagnosis Date Noted  . Acute exacerbation of chronic obstructive pulmonary disease (COPD) (HCC) 2020/10/818  . Pneumonia 2020/10/818  . Bacteremia due to Staphylococcus aureus 05/13/2018  . Vertebral osteomyelitis (HCC) 05/13/2018  . Anemia 05/13/2018  . Pressure injury of skin 04/22/2018  . Sepsis (HCC) 04/20/2018    Palliative Care Assessment & Plan   Recommendations/Plan:  Plans for comfort care once family arrives today.   Anticipated hospital death.   Code Status:    Code Status Orders  (From admission, onward)         Start     Ordered   2018/10/27 0703  Full code  Continuous     2018/10/27 0706        Code Status History    Date Active Date Inactive Code Status Order ID Comments User Context   08/25/18 0437 08/25/18 0706 Full Code [id number]  Loleta RoseForbach, Cory, MD ED   04/20/2018 1926 04/27/2018 1843 Full Code [id number]  Judithe ModestKeene, Jeremiah D, NP Inpatient   04/20/2018 1903 04/20/2018 1926 Full Code [id number]  Houston SirenSainani, Vivek J, MD Inpatient    Advance Directive Documentation     Most  Recent Value  Type of Advance Directive  Living will  Pre-existing out of facility DNR order (yellow form or pink MOST form)  -  "MOST" Form in Place?  -       Prognosis:  Poor overall   Care plan was discussed with RN  Thank you for allowing the Palliative Medicine Team to assist in the care of this patient.   Total Time 12:00-1:15 75 min Prolonged Time Billed yes      Greater than 50%  of this time was  spent counseling and coordinating care related to the above assessment and plan.  Morton Stall, NP  Please contact Palliative Medicine Team phone at [phone] for questions and concerns.

## 2018-06-20 NOTE — Progress Notes (Signed)
[facility]  ROUNDING NOTE   Subjective:   Placed on BIPAP.   Patient continues to have shortness of breath.   Objective:  Vital signs in last 24 hours:  Temp:  [97.6 F (36.4 C)-98.6 F (37 C)] 97.8 F (36.6 C) (05/08 0800) Pulse Rate:  [78-98] 85 (05/08 0900) Resp:  [11-24] 16 (05/08 0900) BP: (82-109)/(48-73) 94/54 (05/08 0900) SpO2:  [84 %-95 %] 92 % (05/08 0900) FiO2 (%):  [65 %-84 %] 80 % (05/08 0800) Weight:  [74.8 kg] 74.8 kg (05/08 0500)  Weight change: -4.2 kg Filed Weights   05/22/18 0500 05/26/18 0500 05/24/2018 0500  Weight: 72.2 kg 79 kg 74.8 kg    Intake/Output: I/O last 3 completed shifts: In: 287 [P.O.:237; Other:50] Out: 875 [Urine:875]   Intake/Output this shift:  Total I/O In: 50 [P.O.:50] Out: 50 [Urine:50]  Physical Exam: General: Critically ill  Head: +bipap  Eyes: Anicteric, PERRL  Neck: Supple, trachea midline  Lungs:  Bilateral crackles, wheezes  Heart: Regular rate and rhythm  Abdomen:  Soft, nontender, obese  Extremities: ++ peripheral edema. Left BKA  Neurologic: Nonfocal, moving all four extremities  Skin: No lesions        Basic Metabolic Panel: Recent Labs  Lab 05/23/18 0529 05/24/18 0428 05/24/18 1440 05/24/18 1943 05/25/18 0453 05/26/18 0505 06/09/2018 0907  NA 135 131* 132*  --  133* 128* 130*  K 4.5 5.2* 5.3* 5.4* 5.4* 5.1 5.7*  CL 98 96* 95*  --  98 92* 94*  CO2 27 25 27   --  27 24 24   GLUCOSE 188* 274* 178*  --  128* 172* 111*  BUN 45* 60* 65*  --  71* 87* 95*  CREATININE 1.57* 1.99* 2.24*  --  2.23* 2.48* 2.73*  CALCIUM 8.0* 8.1* 8.3*  --  8.0* 8.0* 8.2*  MG 2.0  --   --   --   --  2.4 2.4  PHOS 4.7*  --   --   --   --  6.3*  --     Liver Function Tests: Recent Labs  Lab 05/25/2018 0907  AST 16  ALT 8  ALKPHOS 79  BILITOT 0.8  PROT 5.2*  ALBUMIN 2.2*   No results for input(s): LIPASE, AMYLASE in the last 168 hours. No results for input(s): AMMONIA in the last 168 hours.  CBC: Recent  Labs  Lab 05/22/18 1230 05/24/18 0428 05/25/18 0453 05/26/18 0505 05/24/2018 0907  WBC 5.6 5.7 5.1 6.0 6.6  NEUTROABS 4.6  --  3.7  --   --   HGB 8.1* 7.5* 8.2* 7.6* 6.3*  HCT 27.3* 25.1* 27.7* 24.7* 21.2*  MCV 88.6 87.8 87.7 84.6 86.9  PLT 353 325 369 386 305    Cardiac Enzymes: No results for input(s): CKTOTAL, CKMB, CKMBINDEX, TROPONINI in the last 168 hours.  BNP: Invalid input(s): POCBNP  CBG: Recent Labs  Lab 05/26/18 1114 05/26/18 1606 05/26/18 2159 06/10/2018 0727 05/26/2018 1143  GLUCAP 143* 143* 145* 123* 94    Microbiology: Results for orders placed or performed during the hospital encounter of Jan 28, 2018  SARS Coronavirus 2 [facility](Hospital order, Performed in [facility] lab)     Status: None   Collection Time: Jan 28, 2018  4:28 AM  Result Value Ref Range Status   SARS Coronavirus 2 NEGATIVE NEGATIVE Final    Comment: (NOTE) If result is NEGATIVE SARS-CoV-2 target nucleic acids are NOT DETECTED. The SARS-CoV-2 RNA is generally detectable in upper and lower  respiratory specimens  during the acute phase of infection. The lowest  concentration of SARS-CoV-2 viral copies this assay can detect is 250  copies / mL. A negative result does not preclude SARS-CoV-2 infection  and should not be used as the sole basis for treatment or other  patient management decisions.  A negative result may occur with  improper specimen collection / handling, submission of specimen other  than nasopharyngeal swab, presence of viral mutation(s) within the  areas targeted by this assay, and inadequate number of viral copies  (<250 copies / mL). A negative result must be combined with clinical  observations, patient history, and epidemiological information. If result is POSITIVE SARS-CoV-2 target nucleic acids are DETECTED. The SARS-CoV-2 RNA is generally detectable in upper and lower  respiratory specimens dur ing the acute phase of infection.  Positive  results are indicative of  active infection with SARS-CoV-2.  Clinical  correlation with patient history and other diagnostic information is  necessary to determine patient infection status.  Positive results do  not rule out bacterial infection or co-infection with other viruses. If result is PRESUMPTIVE POSTIVE SARS-CoV-2 nucleic acids MAY BE PRESENT.   A presumptive positive result was obtained on the submitted specimen  and confirmed on repeat testing.  While 2019 novel coronavirus  (SARS-CoV-2) nucleic acids may be present in the submitted sample  additional confirmatory testing may be necessary for epidemiological  and / or clinical management purposes  to differentiate between  SARS-CoV-2 and other Sarbecovirus currently known to infect humans.  If clinically indicated additional testing with an alternate test  methodology [phone]) is advised. The SARS-CoV-2 RNA is generally  detectable in upper and lower respiratory sp ecimens during the acute  phase of infection. The expected result is Negative. Fact Sheet for Patients:  BoilerBrush.com.cy Fact Sheet for Healthcare Providers: https://pope.com/ This test is not yet approved or cleared by the [place] FDA and has been authorized for detection and/or diagnosis of SARS-CoV-2 by FDA under an Emergency Use Authorization (EUA).  This EUA will remain in effect (meaning this test can be used) for the duration of the COVID-19 declaration under Section 564(b)(1) of the Act, 21 U.S.C. section 360bbb-3(b)(1), unless the authorization is terminated or revoked sooner. Performed at [facility], [address] [place], [place] [address]   Blood Culture (routine x 2)     Status: None   Collection Time: 05/11/2018  4:51 AM  Result Value Ref Range Status   Specimen Description BLOOD RIGHT FOREARM  Final   Special Requests   Final    BOTTLES DRAWN AEROBIC AND ANAEROBIC Blood Culture results may not be  optimal due to an excessive volume of blood received in culture bottles   Culture   Final    NO GROWTH 5 DAYS Performed at [facility], [address] [place], [place] [address]    Report Status 05/21/2018 FINAL  Final  Blood Culture (routine x 2)     Status: None   Collection Time: 05/08/2018  4:51 AM  Result Value Ref Range Status   Specimen Description BLOOD RIGHT HAND  Final   Special Requests   Final    BOTTLES DRAWN AEROBIC AND ANAEROBIC Blood Culture results may not be optimal due to an inadequate volume of blood received in culture bottles   Culture   Final    NO GROWTH 5 DAYS Performed at [facility], [address] [place], [place] [address]    Report Status 05/21/2018 FINAL  Final  MRSA PCR Screening     Status: None   Collection Time: 05/20/2018  5:53 PM  Result Value Ref Range Status   MRSA by PCR NEGATIVE NEGATIVE Final    Comment:        The GeneXpert MRSA Assay (FDA approved for NASAL specimens only), is one component of a comprehensive MRSA colonization surveillance program. It is not intended to diagnose MRSA infection nor to guide or monitor treatment for MRSA infections. Performed at [facility], [address] [place], [place] [address]   Culture, sputum-assessment     Status: None   Collection Time: 20-May-2018  8:00 PM  Result Value Ref Range Status   Specimen Description SPUTUM  Final   Special Requests NONE  Final   Sputum evaluation   Final    THIS SPECIMEN IS ACCEPTABLE FOR SPUTUM CULTURE Performed at [facility], [address], [place], [place] [address]    Report Status May 20, 2018 FINAL  Final  Culture, respiratory     Status: None   Collection Time: 2018/05/20  8:00 PM  Result Value Ref Range Status   Specimen Description   Final    SPUTUM Performed at [facility], [address], [place], [place] [address]    Special Requests   Final    NONE Reflexed from [id number] Performed at [facility], [address] [place], [place] [address]    Gram Stain   Final    FEW SQUAMOUS EPITHELIAL CELLS PRESENT NO WBC SEEN FEW YEAST Performed at [facility], [address], [place], [place] [address]    Culture   Final    ABUNDANT CANDIDA ALBICANS FEW CANDIDA TROPICALIS    Report Status 05/19/2018 FINAL  Final  Urine Culture     Status: None   Collection Time: 20-May-2018  9:10 PM  Result Value Ref Range Status   Specimen Description   Final    URINE, CLEAN CATCH Performed at [facility], [address], [place], [place] [address]    Special Requests   Final    Normal Performed at [facility], [address], [place], [place] [address]    Culture   Final    NO GROWTH Performed at [facility], [address], [place], [place] [address]    Report Status 05/18/2018 FINAL  Final    Coagulation Studies: Recent Labs    05/25/18 0453 05/26/18 0505 05/26/2018 0907  LABPROT 36.7* 23.4* 21.3*  INR 3.8* 2.1* 1.9*    Urinalysis: No results for input(s): COLORURINE, LABSPEC, PHURINE, GLUCOSEU, HGBUR, BILIRUBINUR, KETONESUR, PROTEINUR, UROBILINOGEN, NITRITE, LEUKOCYTESUR in the last 72 hours.  Invalid input(s): APPERANCEUR    Imaging: Dg Chest Port 1 View  Result Date: 05/26/2018 CLINICAL DATA:  Respiratory distress EXAM: PORTABLE CHEST 1 VIEW COMPARISON:  05/20/2018 FINDINGS: 1133 hours. Diffuse interstitial and patchy airspace disease again identified with a basilar predominance. Confluent retrocardiac left base collapse/consolidation is similar. Small to moderate bilateral pleural effusions evident. Left PICC line tip overlies the distal SVC near the RA junction. The visualized bony structures of the thorax are intact. Telemetry leads overlie the chest. IMPRESSION: Persistent bilateral interstitial and airspace disease with some progression of confluent airspace opacity in the right mid lung. Bibasilar collapse/consolidation with stable  appearance of bilateral effusions. Electronically Signed   By: Kennith Center M.D.   On: 05/26/2018 12:23     Medications:   . albumin human     . amiodarone  200 mg Oral BID  . aspirin EC  81 mg Oral Daily  . chlorhexidine  15 mL Mouth Rinse BID  . docusate sodium  100 mg Oral BID  . FLUoxetine  20 mg Oral Daily  . fluticasone  1 spray Each Nare Daily  . furosemide  80 mg Intravenous Q8H  . insulin aspart  0-15 Units Subcutaneous TID WC  . insulin aspart  0-5 Units Subcutaneous QHS  . insulin glargine  15 Units Subcutaneous Daily  . ipratropium-albuterol  3 mL Nebulization Q6H  . levothyroxine  88 mcg Oral Daily  . mouth rinse  15 mL Mouth Rinse q12n4p  . mirabegron ER  25 mg Oral Daily  . nystatin  5 mL Oral QID  . polyethylene glycol  17 g Oral Daily  . predniSONE  30 mg Oral Q breakfast  . Ensure Max Protein  11 oz Oral BID  . sodium chloride flush  10-40 mL Intracatheter Q12H  . sodium zirconium cyclosilicate  10 g Oral Daily  . traZODone  150 mg Oral QHS  . vancomycin variable dose per unstable renal function (pharmacist dosing)   Does not apply See admin instructions   albuterol, LORazepam, morphine injection, oxyCODONE-acetaminophen **AND** oxyCODONE, phenylephrine, sodium chloride flush  Assessment/ Plan:  Ms. Alexandra Goodman is a 58 y.o. white female with COPD, CVA, PVD, diabetes mellitus type II, hypertension, congestive heart failure, left BKA who was admitted to [facility] on 04/21/2018 for acute exacerbation of COPD, acute CHF exacerbation, and MSSA bacteremia.   1. Acute renal failure on chronic kidney disease stage III with proteinuria: baseline creatinine of 1.14, GFR of 53 on 04/26/2018 2. Hyponatremia 3. Hyperkalemia 4. Diabetes mellitus type II with chronic kidney disease: insulin dependent.  5. Anemia with renal failure 6. Drug induced rash 7. Acute respiratory failure on noninvasive ventilation  Plan  No IV contrast exposure. There is a possibility of  vasculitis or AIN. ANCA, Urine eos pending - IV furosemide - increase frequency to q 8 - Continue Lokelma - Add IV albumin - PRBC transfusion for today. - Continue prednisone.  - Monitor volume status, low threshold for dialysis for ultrafiltration and/or vasopressors.    LOS: 11 Amreen Raczkowski May 27, 202012:09 PM

## 2018-06-20 NOTE — Progress Notes (Signed)
Assisted tele visit to patient with provider and the family.  Tilmon Wisehart, Loni Beckwith, RN

## 2018-06-20 NOTE — Death Summary Note (Addendum)
DEATH SUMMARY   Patient Details  Name: Alexandra Goodman MRN: [id number] DOB: 03/11/1960  Admission/Discharge Information   Admit Date:  17-May-2018  Date of Death:  05/23/2018  Time of Death:  2228  Length of Stay: 11  Referring Physician: Floreen ComberKimel-Scott, Karen, MD   Reason(s) for Hospitalization  END STAGE COPD  Diagnoses  Preliminary cause of death: pneumonia, COPD Secondary Diagnoses (including complications and co-morbidities):  Active Problems:   Acute exacerbation of chronic obstructive pulmonary disease (COPD) (HCC)   Pneumonia    Updated and notified of patients medical condition-  Progressive multiorgan failure with very low chance of meaningful recovery.  Patient is in dying  Process.  Patient understood her prognosis She did NOT want to be on machines to stay alive  PATIENT HAS consented and agreed to DNR/DNI.  Patient subsequently decompensated and died.  Pt transitionws to comfort care, family at bedside.  They were ready to take her off bipap and start morphine drip.    Pt placed on comfort care this evening and pronounced dead at 2228. Family was at bedside throughout dying process and provided emotional support. Pt was kept on morphine gtt for comfort and did not appear to be in any distress throughout shift. Family was thankful for healthcare services. Chaplain was called and provided prayers and comfort for the family.   Pertinent Labs and Studies  Significant Diagnostic Studies Dg Chest Port 1 View  Result Date: 05/26/2018 CLINICAL DATA:  Respiratory distress EXAM: PORTABLE CHEST 1 VIEW COMPARISON:  05/20/2018 FINDINGS: 1133 hours. Diffuse interstitial and patchy airspace disease again identified with a basilar predominance. Confluent retrocardiac left base collapse/consolidation is similar. Small to moderate bilateral pleural effusions evident. Left PICC line tip overlies the distal SVC near the RA junction. The visualized bony structures of the thorax are  intact. Telemetry leads overlie the chest. IMPRESSION: Persistent bilateral interstitial and airspace disease with some progression of confluent airspace opacity in the right mid lung. Bibasilar collapse/consolidation with stable appearance of bilateral effusions. Electronically Signed   By: Kennith CenterEric  Mansell M.D.   On: 05/26/2018 12:23   Dg Chest Port 1 View  Result Date: 05/20/2018 CLINICAL DATA:  Acute respiratory failure EXAM: PORTABLE CHEST 1 VIEW COMPARISON:  May 19, 2018 FINDINGS: The heart size and mediastinal contours are stable. Left PICC line is identified unchanged compared prior exam. Increased pulmonary interstitium is identified bilaterally slightly improved. Patchy consolidation is identified in bilateral lung bases unchanged. The visualized skeletal structures are stable. IMPRESSION: Increased pulmonary interstitium in both lungs are slightly improved compared to prior exam. Patchy consolidation of bilateral lung bases are unchanged compared prior exam. Bilateral pleural effusions are probably unchanged. Electronically Signed   By: Sherian ReinWei-Chen  Lin M.D.   On: 05/20/2018 12:04   Dg Chest Port 1 View  Result Date: 05/19/2018 CLINICAL DATA:  58 year old female with respiratory failure. Recently negative for COVID-19. EXAM: PORTABLE CHEST 1 VIEW COMPARISON:  05/17/2018 and earlier. FINDINGS: Portable AP semi upright view at 0233 hours. Stable left PICC line. Increased veiling opacity at both lung bases and dense retrocardiac opacity. Coarse superimposed pulmonary interstitial opacity in both lungs with progression from earlier this month but not significantly changed from 028-Apr-2020. No pneumothorax. Stable cardiac size and mediastinal contours. Negative visible bowel gas pattern. IMPRESSION: 1. Increasing bilateral pleural effusions suspected with lower lobe collapse or consolidation. 2. Diffuse coarse pulmonary interstitial opacity might reflect edema and is stable since 028-Apr-2020. Electronically  Signed   By: Althea GrimmerH  Hall M.D.  On: 05/19/2018 02:53   Dg Chest Port 1 View  Result Date: 05/17/2018 CLINICAL DATA:  Acute respiratory failure. EXAM: PORTABLE CHEST 1 VIEW COMPARISON:  Radiographs of May 16, 2018. FINDINGS: Stable cardiomegaly. Atherosclerosis of thoracic aorta is noted. Left-sided PICC line is unchanged in position. Stable diffuse interstitial densities are noted throughout both lungs concerning for edema or pneumonia. Stable left basilar atelectasis or infiltrate is noted with associated pleural effusion. Bony thorax is unremarkable. IMPRESSION: Stable bilateral diffuse interstitial densities are noted concerning for edema or pneumonia. Stable left basilar atelectasis or infiltrate is noted with associated pleural effusion. Electronically Signed   By: Lupita Raider M.D.   On: 05/17/2018 07:32   Dg Chest Port 1 View  Result Date: 05/15/2018 CLINICAL DATA:  Shortness of breath. Personal history of COPD. No Mon yeah. EXAM: PORTABLE CHEST 1 VIEW COMPARISON:  One-view chest x-ray 04/22/2018 FINDINGS: Heart size is mildly enlarged. Atherosclerotic changes are again noted at the aortic arch. A diffuse interstitial pattern is superimposed on chronic disease. Bilateral pleural effusions are present. Bibasilar airspace disease likely reflects atelectasis. No other significant airspace consolidation is present. IMPRESSION: 1. Cardiomegaly with a diffuse interstitial pattern superimposed on chronic COPD. Findings are most concerning for edema and congestive heart failure. 2. Bilateral pleural effusions are present. 3. Bibasilar airspace disease likely reflects atelectasis. Infection is not excluded. Electronically Signed   By: Marin Roberts M.D.   On: 05/18/2018 06:01    Microbiology No results found for this or any previous visit (from the past 240 hour(s)).  Lab Basic Metabolic Panel: Recent Labs  Lab 05/23/18 0529 05/24/18 0428 05/24/18 1440 05/24/18 1943 05/25/18 0453  05/26/18 0505 06-10-18 0907  NA 135 131* 132*  --  133* 128* 130*  K 4.5 5.2* 5.3* 5.4* 5.4* 5.1 5.7*  CL 98 96* 95*  --  98 92* 94*  CO2 27 25 27   --  27 24 24   GLUCOSE 188* 274* 178*  --  128* 172* 111*  BUN 45* 60* 65*  --  71* 87* 95*  CREATININE 1.57* 1.99* 2.24*  --  2.23* 2.48* 2.73*  CALCIUM 8.0* 8.1* 8.3*  --  8.0* 8.0* 8.2*  MG 2.0  --   --   --   --  2.4 2.4  PHOS 4.7*  --   --   --   --  6.3*  --    Liver Function Tests: Recent Labs  Lab 06/10/18 0907  AST 16  ALT 8  ALKPHOS 79  BILITOT 0.8  PROT 5.2*  ALBUMIN 2.2*   No results for input(s): LIPASE, AMYLASE in the last 168 hours. No results for input(s): AMMONIA in the last 168 hours. CBC: Recent Labs  Lab 05/22/18 1230 05/24/18 0428 05/25/18 0453 05/26/18 0505 Jun 10, 2018 0907  WBC 5.6 5.7 5.1 6.0 6.6  NEUTROABS 4.6  --  3.7  --   --   HGB 8.1* 7.5* 8.2* 7.6* 6.3*  HCT 27.3* 25.1* 27.7* 24.7* 21.2*  MCV 88.6 87.8 87.7 84.6 86.9  PLT 353 325 369 386 305   Cardiac Enzymes: No results for input(s): CKTOTAL, CKMB, CKMBINDEX, TROPONINI in the last 168 hours. Sepsis Labs: Recent Labs  Lab 05/24/18 0428 05/25/18 0453 05/26/18 0505 06/10/2018 0907  WBC 5.7 5.1 6.0 6.6     Magddalene S Tukov-Yual 2018/06/10, 11:23 PM

## 2018-06-20 NOTE — Progress Notes (Signed)
Pt transitioning to comfort care, family at bedside.  They will let us know when they are ready to take her off bipap and start morphine drip

## 2018-06-20 NOTE — Progress Notes (Signed)
   2018/05/31 2200  Clinical Encounter Type  Visited With Family  Visit Type Initial  Referral From Nurse  Consult/Referral To Chaplain  Spiritual Encounters  Spiritual Needs Prayer;Emotional;Grief support  Stress Factors  Patient Stress Factors Loss  Family Stress Factors Loss  Chaplain received PG patient had decline and Chaplain arrived to support family. Chaplain offered words of encouragement, condolences and prayer. Chaplain left to give family time alone with love one. Chaplain told family to have her page if they needed her. Family thanked [employer].

## 2018-06-20 NOTE — Progress Notes (Addendum)
Pt transitioned to comfort care only once family arrives at bedside.  Sonda Rumble, AGNP  Pulmonary/Critical Care Pager [phone] (please enter 7 digits) PCCM Consult Pager [phone] (please enter 7 digits)

## 2018-06-20 NOTE — Progress Notes (Signed)
[facility] [place] [facility]     PATIENT NAME: Alexandra PippinsMary Gammell    MR#:  [id number]  DATE OF BIRTH:  03/19/1960  SUBJECTIVE:   Patient remains on BiPAP intermittently and still having some shortness of breath.  Plan for family meeting today for goals of care with palliative care.  REVIEW OF SYSTEMS:    Review of Systems  Constitutional: Negative for chills and fever.  HENT: Negative for congestion and tinnitus.   Eyes: Negative for blurred vision and double vision.  Respiratory: Positive for shortness of breath. Negative for cough and wheezing.   Cardiovascular: Negative for chest pain, orthopnea and PND.  Gastrointestinal: Negative for abdominal pain, constipation, diarrhea, nausea and vomiting.  Genitourinary: Negative for dysuria and hematuria.  Skin: Positive for rash.  Neurological: Positive for weakness (generalized. ). Negative for dizziness, sensory change and focal weakness.  All other systems reviewed and are negative.   Nutrition: Carb control Tolerating Diet: Yes Tolerating PT: Await Eval.   DRUG ALLERGIES:   Allergies  Allergen Reactions   Skin Protectants, Misc. Rash   Flagyl [Metronidazole]    Gabapentin    Levetiracetam    Tape Other (See Comments)    blisters   Ace Inhibitors Rash   Cefazolin Rash    Rash developed on cefazolin while on long-term treatment (was on 3-4wks)   Ertapenem Rash   Sertraline Hcl Rash    VITALS:  Blood pressure (!) 79/52, pulse 78, temperature 97.7 F (36.5 C), temperature source Other (Comment), resp. rate 12, height 5\' 3"  (1.6 m), weight 74.8 kg, SpO2 90 %.  PHYSICAL EXAMINATION:   Physical Exam  GENERAL:  58 y.o.-year-old patient lying in bed in mild Resp. Distress.  EYES: Pupils equal, round, reactive to light and accommodation. No scleral icterus. Extraocular muscles intact.  HEENT: Head atraumatic, normocephalic. Oropharynx and nasopharynx clear.  NECK:  Supple, no jugular venous  distention. No thyroid enlargement, no tenderness.  LUNGS: Normal breath sounds bilaterally, no wheezing, rales, rhonchi. + use of accessory muscles of respiration.  CARDIOVASCULAR: S1, S2 normal. No murmurs, rubs, or gallops.  ABDOMEN: Soft, nontender, nondistended. Bowel sounds present. No organomegaly or mass.  EXTREMITIES: No cyanosis, clubbing or edema b/l.   Left sided BKA NEUROLOGIC: Cranial nerves II through XII are intact. No focal Motor or sensory deficits b/l.  Globally weak. PSYCHIATRIC: The patient is alert and oriented x 3.  SKIN: No obvious rash, lesion, or ulcer.  Patient has a purpuric rash on her arms and legs as seen below          LABORATORY PANEL:   CBC Recent Labs  Lab 06/16/2018 0907  WBC 6.6  HGB 6.3*  HCT 21.2*  PLT 305   ------------------------------------------------------------------------------------------------------------------  Chemistries  Recent Labs  Lab 06/08/2018 0907  NA 130*  K 5.7*  CL 94*  CO2 24  GLUCOSE 111*  BUN 95*  CREATININE 2.73*  CALCIUM 8.2*  MG 2.4  AST 16  ALT 8  ALKPHOS 79  BILITOT 0.8   ------------------------------------------------------------------------------------------------------------------  Cardiac Enzymes No results for input(s): TROPONINI in the last 168 hours. ------------------------------------------------------------------------------------------------------------------  RADIOLOGY:  Dg Chest Port 1 View  Result Date: 05/26/2018 CLINICAL DATA:  Respiratory distress EXAM: PORTABLE CHEST 1 VIEW COMPARISON:  05/20/2018 FINDINGS: 1133 hours. Diffuse interstitial and patchy airspace disease again identified with a basilar predominance. Confluent retrocardiac left base collapse/consolidation is similar. Small to moderate bilateral pleural effusions evident. Left PICC line tip overlies the distal SVC near the  RA junction. The visualized bony structures of the thorax are intact. Telemetry leads overlie  the chest. IMPRESSION: Persistent bilateral interstitial and airspace disease with some progression of confluent airspace opacity in the right mid lung. Bibasilar collapse/consolidation with stable appearance of bilateral effusions. Electronically Signed   By: Kennith Center M.D.   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  1.  Acute on chronic respiratory failure with hypoxia- secondary to underlying COPD and also CHF. -Continue BiPAP support, duo nebs, prednisone.   -Continue high flow nasal cannula and intermittent BiPAP.  2.  Acute on chronic diastolic congestive heart failure- cont. Diuresis with IV lasix and dose increased as per Nephrology.  - continues to require intermitted Bipap.    3.  History of discitis with MSSA bacteremia- continue vancomycin for now.  Patient was on Ancef but developed a rash and therefore has been switched to vancomycin.   - appreciate ID input and cont. Vanc until 06/04/18 and outpatient follow up with Neurosurgery.   4.  Rash-patient has purpuric rash on her upper and lower extremities -Initially thought to be secondary to Ancef but after seen by ID they think this could be a warfarin induced purpura/?? Vasculitis  - s/p biopsy and await results probably on Monday.  -Clinically the rash is improving and cont. To hold coumadin.  She is also off Ancef  5. Acute on chronic renal failure- patient's baseline creatinine is around 1.1 with CKD stage III with some proteinuria. - Seen by nephrology, etiology unclear presently.  Possible vasculitis versus allergic interstitial nephritis.   - ANCA and urine eosinophils pending. - Currently on Lasix and will increase dose and follow response.  Not a good candidate  for dialysis given multiple comorbidities  7. Anemia - Hg. Down to 6.3 today.  - ?? Etiology and likely anemia of chronic disease. Transfusion as per Intensivist.   8.  Hx of atrial fibrillation-rate controlled.  Continue amiodarone.   - hold coumadin due to above.   9.  COPD- She has bad end-stage COPD. -   Continue, prednisone, scheduled duonebs.  Prognosis is poor.   10.  Hypothyroidism-continue Synthroid.  11.  Diabetes type 2 without complication- continue lantus, sliding scale insulin.  Blood sugar stable.  12.  Hyperlipidemia-continue atorvastatin.  13.  Anxiety-continue Xanax.  Her prognosis is poor given her end-stage COPD and multiple comorbidities.  Appreciate palliative care input and looks like based on notes this afternoon patient to be transitioned to comfort care once further family arrives today.   All the records are reviewed and case discussed with Care Management/Social Worker. Management plans discussed with the patient, family and they are in agreement.  CODE STATUS: DNR  DVT Prophylaxis: Ted's & SCD's.   TOTAL TIME TAKING CARE OF THIS PATIENT: 30 minutes.   POSSIBLE D/C IN unclear, DEPENDING ON CLINICAL CONDITION and progress   Houston Siren M.D on 25-Jun-2018 at 2:35 PM  Between 7am to 6pm - Pager - [phone]  After 6pm go to [email or web address] - Social research officer, government  [facility] [place]  Office  [phone]  CC: Primary care physician; Floreen Comber, MD

## 2018-06-20 NOTE — Death Summary Note (Signed)
Pt placed on comfort care this evening and pronounced dead at 12-May-2226. Family was at bedside throughout dying process and provided emotional support. Pt was kept on morphine gtt for comfort and did not appear to be in any distress throughout shift. Family was thankful for healthcare services. Chaplain was called and provided prayers and comfort for the family.

## 2018-06-20 DEATH — deceased

## 2019-12-14 IMAGING — DX PORTABLE CHEST - 1 VIEW
1 series · 1 of 1 positions shown · non-contrast
Comparison: One-view chest x-ray 04/22/2018

CLINICAL DATA: Shortness of breath. Personal history of COPD. No
[place].

EXAM:
PORTABLE CHEST 1 VIEW

[chest ap]
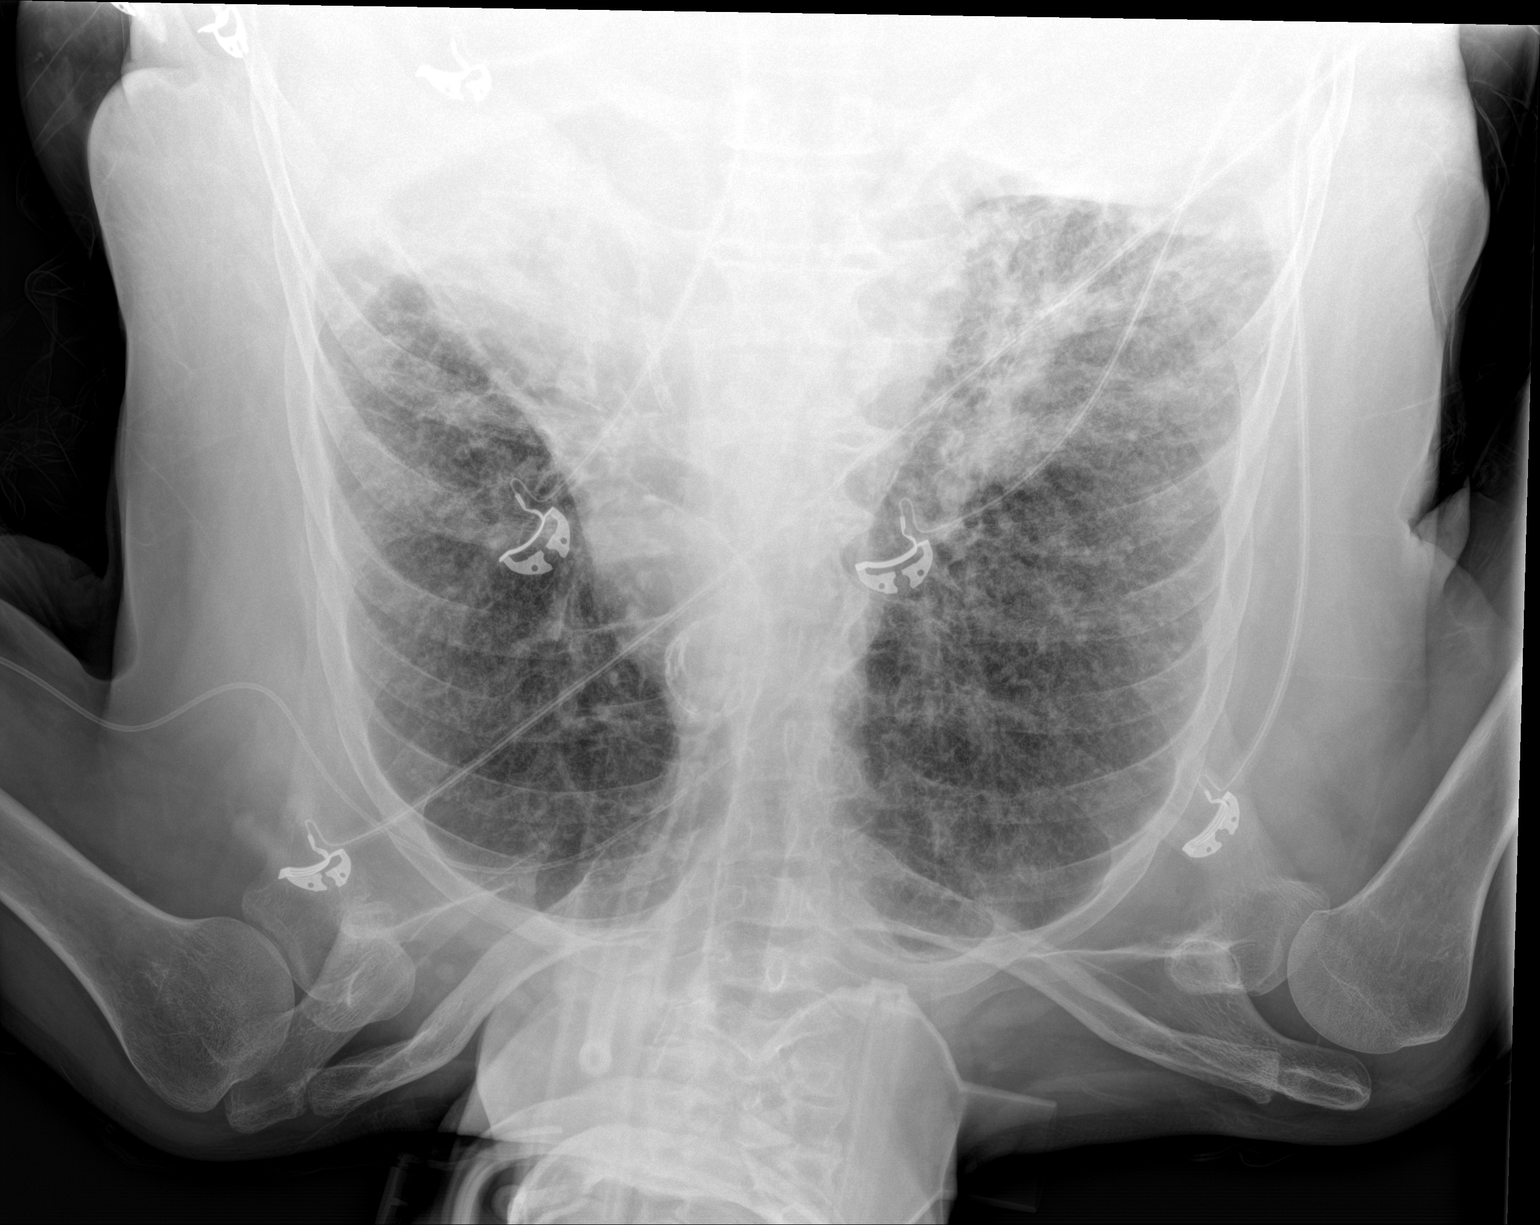

[1 of 1 positions shown; findings below may reference images not displayed]

FINDINGS: Heart size is mildly enlarged. Atherosclerotic changes are again
noted at the aortic arch. A diffuse interstitial pattern is
superimposed on chronic disease. Bilateral pleural effusions are
present. Bibasilar airspace disease likely reflects atelectasis. No
other significant airspace consolidation is present.
IMPRESSION: 1. Cardiomegaly with a diffuse interstitial pattern superimposed on
chronic COPD. Findings are most concerning for edema and congestive
heart failure.
2. Bilateral pleural effusions are present.
3. Bibasilar airspace disease likely reflects atelectasis. Infection
is not excluded.

## 2019-12-15 IMAGING — DX PORTABLE CHEST - 1 VIEW
1 series · 1 of 1 positions shown · non-contrast
Comparison: Radiographs May 16, 2018.

CLINICAL DATA: Acute respiratory failure.

EXAM:
PORTABLE CHEST 1 VIEW

[chest ap]
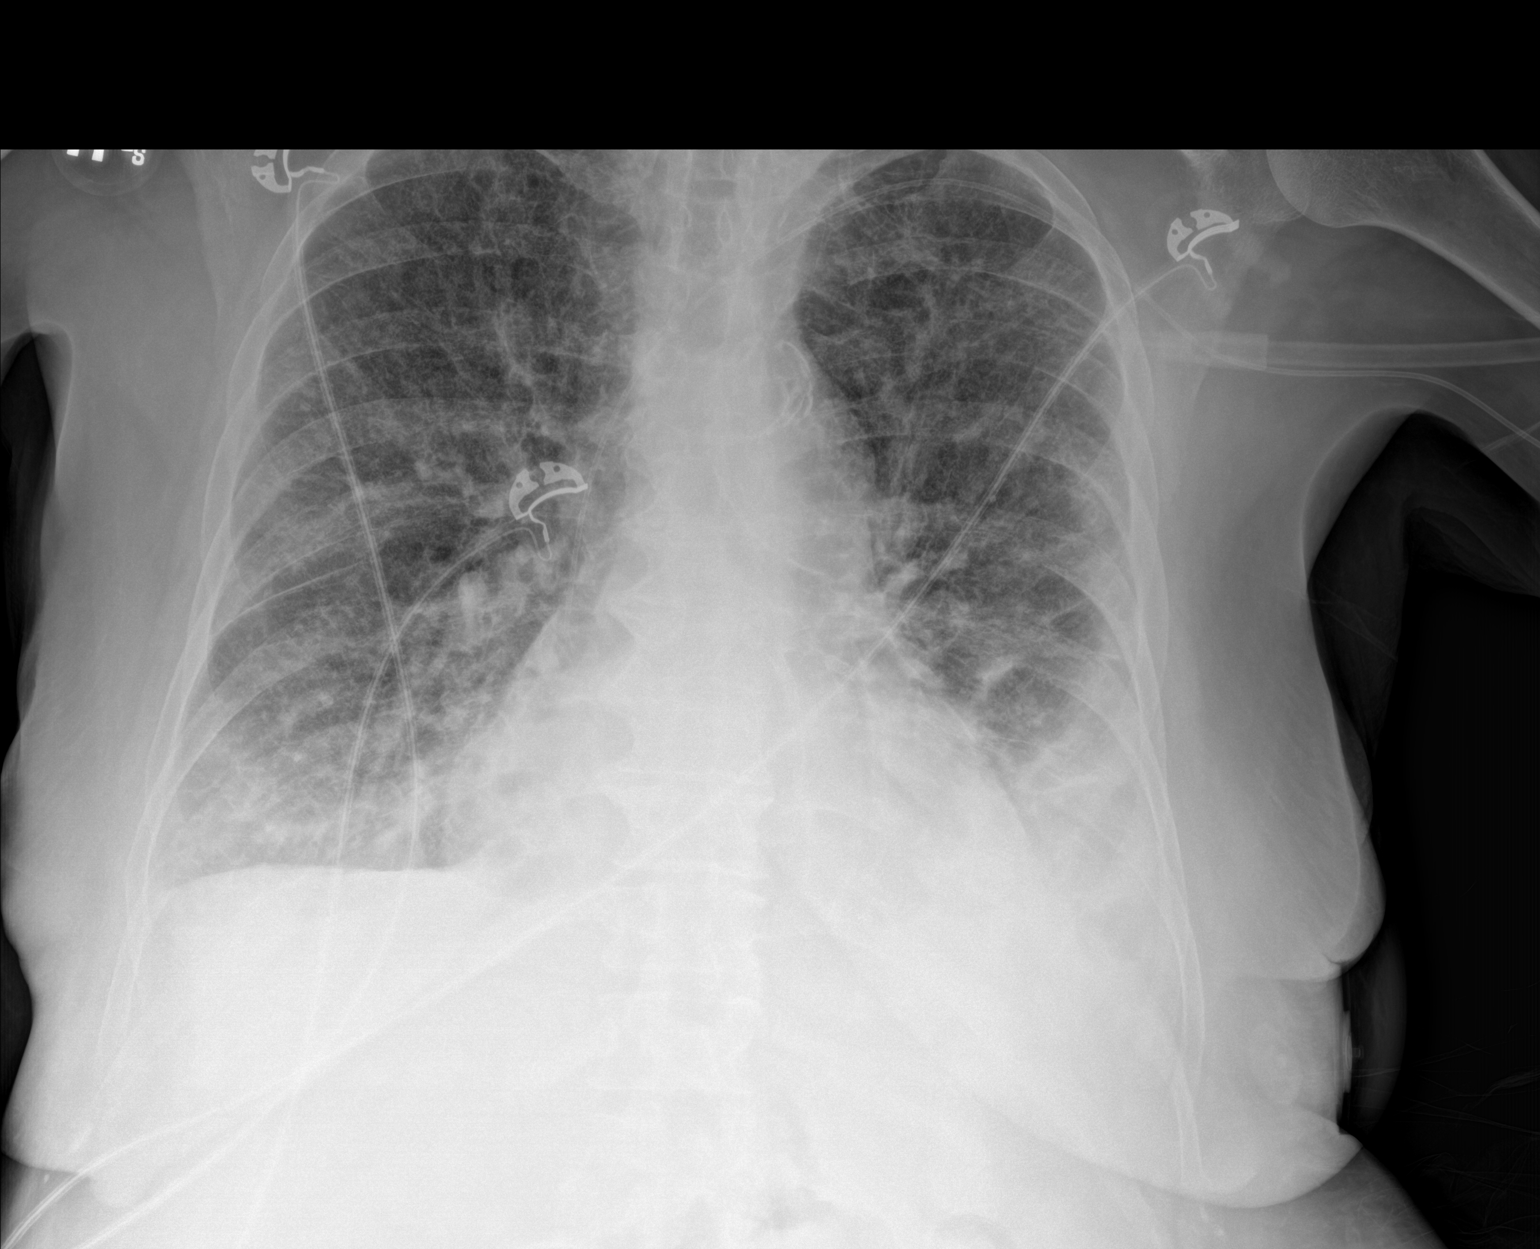

[1 of 1 positions shown; findings below may reference images not displayed]

FINDINGS: Stable cardiomegaly. Atherosclerosis of thoracic aorta is noted.
Left-sided PICC line is unchanged in position. Stable diffuse
interstitial densities are noted throughout both lungs concerning
for edema or pneumonia. Stable left basilar atelectasis or
infiltrate is noted with associated pleural effusion. Bony thorax is
unremarkable.
IMPRESSION: Stable bilateral diffuse interstitial densities are noted concerning
for edema or pneumonia. Stable left basilar atelectasis or
infiltrate is noted with associated pleural effusion.

## 2019-12-17 IMAGING — DX PORTABLE CHEST - 1 VIEW
1 series · 2 of 2 positions shown · non-contrast
Comparison: 05/17/2018 and earlier.

CLINICAL DATA: 57-year-old female with respiratory failure.
Recently negative for [id number].

EXAM:
PORTABLE CHEST 1 VIEW

[Series 1: chest ap · 0.14mm/px · 2 of 2 slices shown]
[im 1/2]
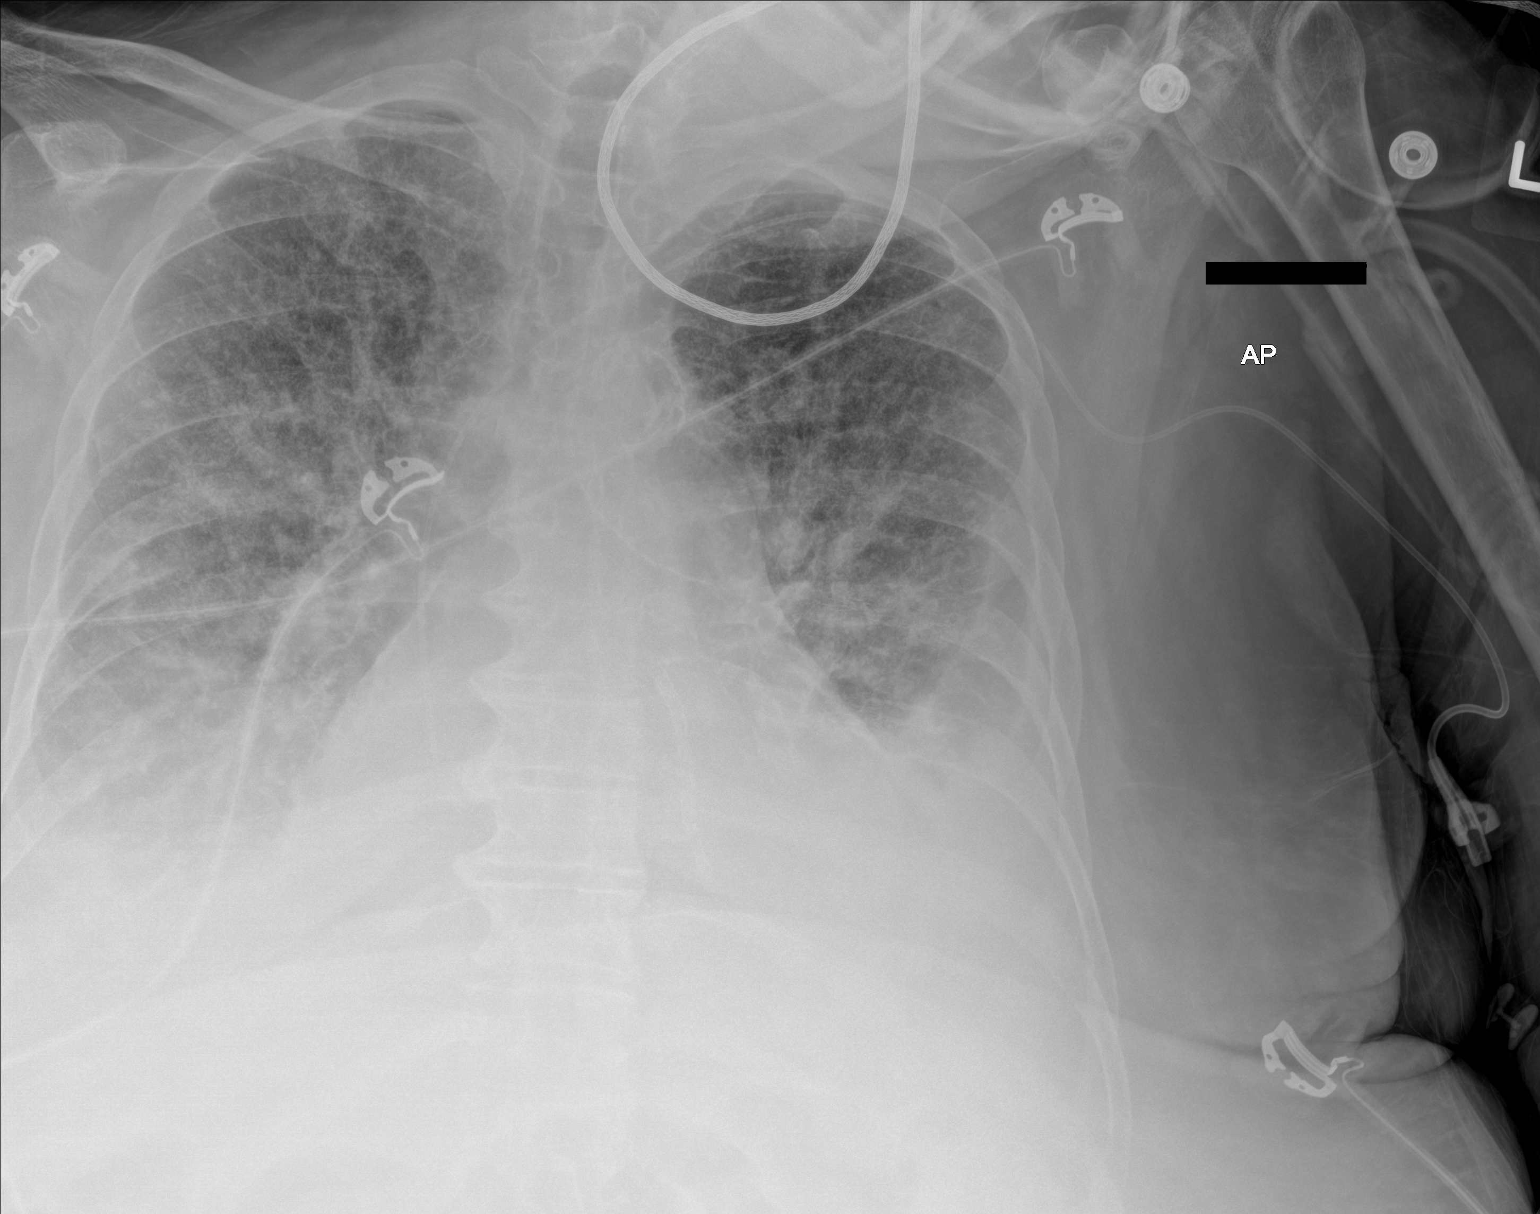
[im 2/2]
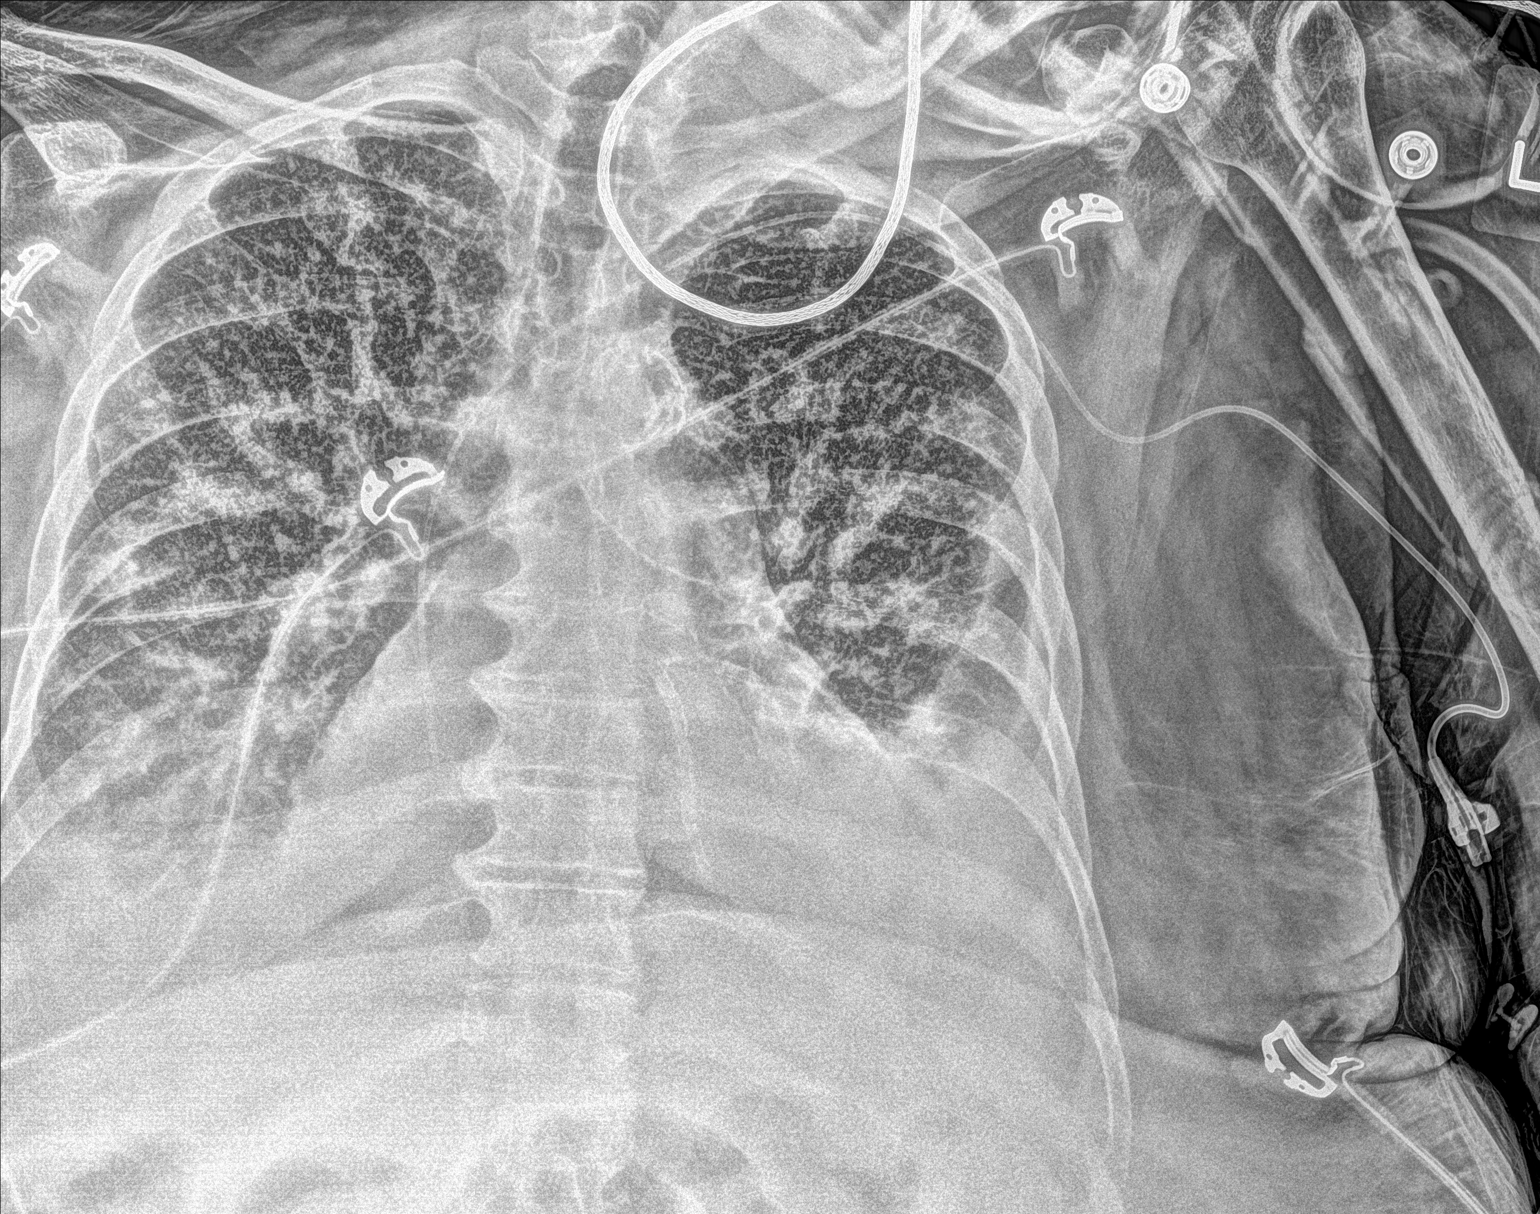

[2 of 2 positions shown; findings below may reference images not displayed]

FINDINGS: Portable AP semi upright view at 4411 hours. Stable left PICC line.
Increased veiling opacity at both lung bases and dense retrocardiac
opacity. Coarse superimposed pulmonary interstitial opacity in both
lungs with progression from earlier this month but not significantly
changed from 05/16/2018. No pneumothorax. Stable cardiac size and
mediastinal contours. Negative visible bowel gas pattern.
IMPRESSION: 1. Increasing bilateral pleural effusions suspected with lower lobe
collapse or consolidation.
2. Diffuse coarse pulmonary interstitial opacity might reflect edema
and is stable since 05/16/2018.
